# Patient Record
Sex: Male | Born: 1955
Health system: Southern US, Community
[De-identification: ages and names within clinical notes are randomized; demographics above are authoritative.]

## PROBLEM LIST (undated history)

## (undated) DIAGNOSIS — I2699 Other pulmonary embolism without acute cor pulmonale: Secondary | ICD-10-CM

## (undated) DIAGNOSIS — R079 Chest pain, unspecified: Secondary | ICD-10-CM

## (undated) DIAGNOSIS — I82409 Acute embolism and thrombosis of unspecified deep veins of unspecified lower extremity: Secondary | ICD-10-CM

## (undated) DIAGNOSIS — K635 Polyp of colon: Secondary | ICD-10-CM

## (undated) DIAGNOSIS — E785 Hyperlipidemia, unspecified: Secondary | ICD-10-CM

## (undated) DIAGNOSIS — Z8619 Personal history of other infectious and parasitic diseases: Secondary | ICD-10-CM

## (undated) DIAGNOSIS — B559 Leishmaniasis, unspecified: Secondary | ICD-10-CM

## (undated) HISTORY — DX: Acute embolism and thrombosis of unspecified deep veins of unspecified lower extremity: I82.409

## (undated) HISTORY — PX: COLONOSCOPY: SHX174

## (undated) HISTORY — DX: Polyp of colon: K63.5

## (undated) HISTORY — DX: Personal history of other infectious and parasitic diseases: Z86.19

## (undated) HISTORY — DX: Leishmaniasis, unspecified: B55.9

## (undated) HISTORY — DX: Hyperlipidemia, unspecified: E78.5

## (undated) HISTORY — DX: Other pulmonary embolism without acute cor pulmonale: I26.99

## (undated) HISTORY — DX: Chest pain, unspecified: R07.9

---

## 2003-01-31 HISTORY — PX: TENDON REPAIR: SHX5111

## 2003-12-08 ENCOUNTER — Ambulatory Visit (HOSPITAL_BASED_OUTPATIENT_CLINIC_OR_DEPARTMENT_OTHER): Admission: RE | Admit: 2003-12-08 | Discharge: 2003-12-08 | Payer: Self-pay | Admitting: Orthopedic Surgery

## 2003-12-08 ENCOUNTER — Ambulatory Visit (HOSPITAL_COMMUNITY): Admission: RE | Admit: 2003-12-08 | Discharge: 2003-12-08 | Payer: Self-pay | Admitting: Orthopedic Surgery

## 2005-01-30 HISTORY — PX: ROTATOR CUFF REPAIR: SHX139

## 2007-01-31 HISTORY — PX: CATARACT EXTRACTION, BILATERAL: SHX1313

## 2010-11-28 ENCOUNTER — Encounter: Payer: Self-pay | Admitting: Internal Medicine

## 2010-11-28 ENCOUNTER — Ambulatory Visit (INDEPENDENT_AMBULATORY_CARE_PROVIDER_SITE_OTHER): Payer: Managed Care, Other (non HMO) | Admitting: Internal Medicine

## 2010-11-28 VITALS — BP 90/60 | HR 68 | Temp 97.8°F | Resp 18 | Ht 71.5 in | Wt 221.0 lb

## 2010-11-28 DIAGNOSIS — B551 Cutaneous leishmaniasis: Secondary | ICD-10-CM

## 2010-11-28 DIAGNOSIS — Z7901 Long term (current) use of anticoagulants: Secondary | ICD-10-CM

## 2010-11-28 DIAGNOSIS — B559 Leishmaniasis, unspecified: Secondary | ICD-10-CM

## 2010-11-28 DIAGNOSIS — I2699 Other pulmonary embolism without acute cor pulmonale: Secondary | ICD-10-CM | POA: Insufficient documentation

## 2010-11-28 LAB — PROTIME-INR
INR: 1.38 (ref <1.50)
Prothrombin Time: 17.5 seconds — ABNORMAL HIGH (ref 11.6–15.2)

## 2010-11-28 MED ORDER — ENOXAPARIN SODIUM 150 MG/ML ~~LOC~~ SOLN: 1.5000 mg/kg | Freq: Every day | SUBCUTANEOUS | Status: DC

## 2010-12-01 ENCOUNTER — Other Ambulatory Visit: Payer: Self-pay | Admitting: *Deleted

## 2010-12-01 ENCOUNTER — Encounter: Payer: Self-pay | Admitting: Internal Medicine

## 2010-12-01 ENCOUNTER — Telehealth: Payer: Self-pay | Admitting: *Deleted

## 2010-12-01 ENCOUNTER — Ambulatory Visit: Payer: Self-pay | Admitting: Internal Medicine

## 2010-12-01 ENCOUNTER — Ambulatory Visit (INDEPENDENT_AMBULATORY_CARE_PROVIDER_SITE_OTHER): Payer: Managed Care, Other (non HMO) | Admitting: Internal Medicine

## 2010-12-01 VITALS — BP 116/73 | HR 64 | Temp 97.7°F | Ht 72.0 in | Wt 223.0 lb

## 2010-12-01 DIAGNOSIS — Z7901 Long term (current) use of anticoagulants: Secondary | ICD-10-CM

## 2010-12-01 DIAGNOSIS — B551 Cutaneous leishmaniasis: Secondary | ICD-10-CM

## 2010-12-01 DIAGNOSIS — I2699 Other pulmonary embolism without acute cor pulmonale: Secondary | ICD-10-CM

## 2010-12-01 LAB — PROTIME-INR: Prothrombin Time: 21.8 seconds — ABNORMAL HIGH (ref 11.6–15.2)

## 2010-12-01 NOTE — Telephone Encounter (Signed)
Patient returned phone call. He stated that he received voice message left. He was informed Dr Rodena Medin would like for him to remain on Lovenox until PT is at 2 or greater. He stated that he will need 2 additional syringes until Tuesday lab draw. He was informed Rx would be available at [facility] he previously picked up Rx. Patient has verbalized understanding and agrees as instructed. He stated that he will be in Tuesday for blood draw.  Lab order entered for [facility] for 12/06/2010.

## 2010-12-01 NOTE — Progress Notes (Signed)
  Subjective:    Patient ID: Glenn Wheeler, male    DOB: 12-24-1955, 55 y.o.   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 He had 2 different lesions that were isolated to the skin, one being on his right forehand and the other on the right side of his torso that started as what he describes as pimples. He then progressed to skin thickening and thenbegan to ulcerate. He was initially seen by her primary physician who did do a biopsy of it and it was consistent with leishmaniasis. He did not however get speciated and so it is unclear the type. He was sent to followup then at the [facility] in the [place], [place] where he underwent treatment for cutaneous leishmaniasis. He was treated with stibogluconate for 28 days which she tolerated well.  This was all done in [place]. He completed treatment around September 2 and essentially had no significant side effects from the medication. However prior to this appointment on return to the [place], he did end up having a pulmonary embolus. He now then is taking Coumadin and otherwise is doing well. He tells me in regards to his leishmaniasis lesions approximately 7-10 days after beginning therapy they did respond and close up. Today he has no particular complaints and does state that he is happy with the lesions progress as they are now completely resolved.    Review of Systems  Constitutional: Negative for activity change, appetite change, fatigue and unexpected weight change.  Respiratory: Negative for chest tightness.   Gastrointestinal: Negative for nausea, diarrhea and constipation.  Skin: Negative for rash and wound.       Objective:   Physical Exam  Constitutional: He appears well-developed and well-nourished. No distress.  Cardiovascular: Normal rate, regular rhythm and normal  heart sounds.   No murmur heard. Pulmonary/Chest: Effort normal and breath sounds normal. No respiratory distress.  Skin: Skin is warm and dry. No rash noted. No erythema.          Healed areas of previous leishmaniasis lesions.  Small area of erythema.   Psychiatric: He has a normal mood and affect. His behavior is normal.          Assessment & Plan:

## 2010-12-01 NOTE — Assessment & Plan Note (Signed)
At this time, the patient is doing well and his lesions have completely healed. I did discuss with him that having successfully completed treatment there is a small chance that they can recur or that new lesions can occur that had previously not been evident. He will followup in 3 months time for another skin exam 2 sure that they are not worsening again. He was told to call back if he does feel that they are coming back in the meantime though. I also explained that his wife can keep an eye on other parts of his body such as back where he is not able to see as well. If this does recur, second line therapy may need to be employed.

## 2010-12-01 NOTE — Telephone Encounter (Signed)
Call placed to patient at [phone], no answer. A detailed voice message was left informing patient per Dr Rodena Medin instruction. Message was left for patient to return phone call if refill on Lovenox was needed.

## 2010-12-01 NOTE — Telephone Encounter (Signed)
Message copied by Glendell Docker on Thu Dec 01, 2010  1:34 PM ------      Message from: Staci Righter.      Created: Thu Dec 01, 2010 12:36 PM       inr 1.83 (was 1.38). inr finally increasing. Continue same dose. Recheck inr on tues. Continue lovenox (refill if necessary) until inr >2

## 2010-12-02 ENCOUNTER — Encounter: Payer: Self-pay | Admitting: Internal Medicine

## 2010-12-04 ENCOUNTER — Encounter: Payer: Self-pay | Admitting: Internal Medicine

## 2010-12-04 NOTE — Assessment & Plan Note (Addendum)
Obtain stat INR. Resume lovenox (reviewed previous dosing from hospital). Continue lovenox until inr above 2. inr goal 2-3. Coumadin clinic referral. Hematology consult. Schedule close f/u in 2wks or sooner if needed.

## 2010-12-04 NOTE — Assessment & Plan Note (Signed)
Follow up with ID. Avoid pentostam if possible due to phlebotoxic potential.

## 2010-12-04 NOTE — Progress Notes (Signed)
  Subjective:    Patient ID: Glenn Wheeler, male    DOB: 1955-04-19, 55 y.o.   MRN: [id number]  HPI Pt presents to clinic to establish care and for evaluation of multiple medical problems. Recently diagnosed with PE while visiting [place] and placed on lovenox/coumadin without gross active bleeding. Has run out of lovenox and last inr recalled to be 1.23 several days ago. Believes le [place] was neg for dvt. Precipitating factors include prolonged international air travel prior to PE. However father s/p PE but with reportedly neg hypercoagulable w/u. Also recent administered pentostam injxn for cutaneous leshmaniasis. Skin lesions have resolved and is scheduled to see ID in near future for follow up. Colonoscopy utd 2009 with unknown pathology polyps. No other complaints.  Reviewed pmh, psh, medications, allergies, soc hx and fam hx.    Review of Systems  Constitutional: Negative for fever and chills.  Respiratory: Negative for cough and shortness of breath.   Cardiovascular: Negative for chest pain.  Gastrointestinal: Negative for blood in stool.  Genitourinary: Negative for hematuria.  Skin: Negative for rash and wound.  All other systems reviewed and are negative.       Objective:   Physical Exam  Nursing note and vitals reviewed. Constitutional: He appears well-developed and well-nourished. No distress.  HENT:  Head: Normocephalic and atraumatic.  Right Ear: External ear normal.  Left Ear: External ear normal.  Eyes: Conjunctivae are normal.  Neck: Neck supple.  Cardiovascular: Normal rate, regular rhythm and normal heart sounds.  Exam reveals no gallop and no friction rub.   No murmur heard. Pulmonary/Chest: Effort normal and breath sounds normal. No respiratory distress. He has no wheezes. He has no rales.  Neurological: He is alert.  Skin: Skin is warm and dry. He is not diaphoretic.  Psychiatric: He has a normal mood and affect.          Assessment & Plan:

## 2010-12-06 ENCOUNTER — Other Ambulatory Visit: Payer: Self-pay | Admitting: Internal Medicine

## 2010-12-06 ENCOUNTER — Telehealth: Payer: Self-pay | Admitting: *Deleted

## 2010-12-06 DIAGNOSIS — I2699 Other pulmonary embolism without acute cor pulmonale: Secondary | ICD-10-CM

## 2010-12-06 LAB — PROTIME-INR
INR: 2.67 — ABNORMAL HIGH (ref <1.50)
Prothrombin Time: 29.3 seconds — ABNORMAL HIGH (ref 11.6–15.2)

## 2010-12-06 NOTE — Telephone Encounter (Signed)
Received call from [facility] that Stat PT/INR from today is:  PT--29.3   INR--2.67. Pt's current coumadin dose is 10mg  daily and lovenox injections 150mg  daily. Please advise.

## 2010-12-06 NOTE — Telephone Encounter (Signed)
Pt notified and will take Coumadin 7.5mg  (1.5 tabs of 5mg ) daily. Lab order has been entered and forwarded to the lab for Friday.

## 2010-12-06 NOTE — Telephone Encounter (Signed)
Patient called to check what dose of coumaden and should he take his lovenox shot.  Please advise

## 2010-12-06 NOTE — Telephone Encounter (Signed)
Pt can stop lovenox.  He should decrease coumadin to 7.5mg  once a day ( 1.5 tabs of the 5 mg if that is what he has at home).  He should have a stat PT/INR drawn on Friday of this week.  We are working on coumadin clinic referral but it is not yet set up.

## 2010-12-08 ENCOUNTER — Telehealth: Payer: Self-pay | Admitting: Hematology & Oncology

## 2010-12-08 ENCOUNTER — Ambulatory Visit: Payer: Self-pay | Admitting: Internal Medicine

## 2010-12-08 NOTE — Telephone Encounter (Signed)
Pt aware of 12-21-10 appointment

## 2010-12-09 ENCOUNTER — Telehealth: Payer: Self-pay | Admitting: Internal Medicine

## 2010-12-09 ENCOUNTER — Other Ambulatory Visit: Payer: Self-pay | Admitting: Internal Medicine

## 2010-12-09 DIAGNOSIS — Z7901 Long term (current) use of anticoagulants: Secondary | ICD-10-CM

## 2010-12-09 LAB — PROTIME-INR: Prothrombin Time: 27.1 seconds — ABNORMAL HIGH (ref 11.6–15.2)

## 2010-12-09 NOTE — Telephone Encounter (Signed)
Call placed to patient [phone], he was informed per Dr Rodena Medin instruction. He stated that he will return on Friday of next week for blood draw. Lab order entered for [facility] for Friday 12/16/2010.

## 2010-12-09 NOTE — Telephone Encounter (Signed)
Glenn Wheeler with [facility] called with stat PT/INR results on patient  PT 27.1 High INR 2.42 High

## 2010-12-09 NOTE — Telephone Encounter (Signed)
Continue same dose 7.5mg  qd. Should be off lovenox already. Recheck one week. ?status of coumadin clinic referral

## 2010-12-12 ENCOUNTER — Ambulatory Visit (INDEPENDENT_AMBULATORY_CARE_PROVIDER_SITE_OTHER): Payer: Managed Care, Other (non HMO) | Admitting: Internal Medicine

## 2010-12-12 ENCOUNTER — Encounter: Payer: Self-pay | Admitting: Internal Medicine

## 2010-12-12 ENCOUNTER — Ambulatory Visit: Payer: Self-pay | Admitting: Internal Medicine

## 2010-12-12 VITALS — BP 100/70 | HR 78 | Temp 97.7°F | Resp 18 | Wt 226.0 lb

## 2010-12-12 DIAGNOSIS — J3489 Other specified disorders of nose and nasal sinuses: Secondary | ICD-10-CM

## 2010-12-12 DIAGNOSIS — I2699 Other pulmonary embolism without acute cor pulmonale: Secondary | ICD-10-CM

## 2010-12-12 MED ORDER — WARFARIN SODIUM 5 MG PO TABS: ORAL_TABLET | ORAL | Status: DC

## 2010-12-12 NOTE — Progress Notes (Signed)
  Subjective:    Patient ID: Glenn Wheeler, male    DOB: 1955/02/10, 55 y.o.   MRN: [id number]  HPI Pt presents to clinic for followup of multiple medical problems. Tolerating coumadin without gross active bleeding. Denies hemoptysis, blood in stool, epistaxis or hematuria. Has left nostril irritation without injury or drainage. Has H/O and CC appts pending. No other complaints.  Past Medical History  Diagnosis Date  . History of chicken pox     childhood  . Hyperlipidemia   . Colon polyps     2009   Past Surgical History  Procedure Date  . Tendon repair 2005    right perineal tendon-[facility]  . Rotator cuff repair 2007    left [facility]  . Cataract extraction, bilateral 2009    [facility]    reports that he has quit smoking. He has never used smokeless tobacco. He reports that he does not drink alcohol or use illicit drugs. family history includes Hyperlipidemia in his mother; Hypertension in his mother; Prostate cancer in his father; and Stroke in his paternal grandfather. No Known Allergies   Review of Systems see hpi     Objective:   Physical Exam  Nursing note and vitals reviewed. Constitutional: He appears well-developed and well-nourished. No distress.  HENT:  Head: Normocephalic and atraumatic.  Right Ear: External ear normal.  Left Ear: External ear normal.  Nose: Mucosal edema present. No rhinorrhea. No epistaxis.  No foreign bodies.  Eyes: Conjunctivae are normal. No scleral icterus.  Skin: He is not diaphoretic.          Assessment & Plan:

## 2010-12-13 NOTE — Assessment & Plan Note (Signed)
Attempt saline nasal spray. Followup if no improvement or worsening.

## 2010-12-13 NOTE — Assessment & Plan Note (Signed)
Stable. Continue anticoagulation. 

## 2010-12-16 ENCOUNTER — Other Ambulatory Visit: Payer: Self-pay | Admitting: *Deleted

## 2010-12-16 DIAGNOSIS — Z7901 Long term (current) use of anticoagulants: Secondary | ICD-10-CM

## 2010-12-16 LAB — PROTIME-INR
INR: 1.77 — ABNORMAL HIGH (ref <1.50)
Prothrombin Time: 21.2 seconds — ABNORMAL HIGH (ref 11.6–15.2)

## 2010-12-21 ENCOUNTER — Ambulatory Visit (HOSPITAL_BASED_OUTPATIENT_CLINIC_OR_DEPARTMENT_OTHER): Payer: Managed Care, Other (non HMO) | Admitting: Hematology & Oncology

## 2010-12-21 ENCOUNTER — Ambulatory Visit: Payer: Managed Care, Other (non HMO) | Admitting: Family

## 2010-12-21 ENCOUNTER — Ambulatory Visit: Payer: Managed Care, Other (non HMO)

## 2010-12-21 ENCOUNTER — Other Ambulatory Visit (HOSPITAL_BASED_OUTPATIENT_CLINIC_OR_DEPARTMENT_OTHER): Payer: Managed Care, Other (non HMO) | Admitting: Lab

## 2010-12-21 ENCOUNTER — Other Ambulatory Visit: Payer: Self-pay | Admitting: Hematology & Oncology

## 2010-12-21 VITALS — BP 108/69 | HR 81 | Temp 97.6°F | Ht 72.0 in | Wt 224.0 lb

## 2010-12-21 DIAGNOSIS — I2699 Other pulmonary embolism without acute cor pulmonale: Secondary | ICD-10-CM

## 2010-12-21 DIAGNOSIS — D689 Coagulation defect, unspecified: Secondary | ICD-10-CM

## 2010-12-21 LAB — CBC WITH DIFFERENTIAL (CANCER CENTER ONLY)
Eosinophils Absolute: 0.2 10*3/uL (ref 0.0–0.5)
HCT: 39.8 % (ref 38.7–49.9)
LYMPH%: 29.2 % (ref 14.0–48.0)
MCH: 29.9 pg (ref 28.0–33.4)
MCV: 84 fL (ref 82–98)
MONO#: 0.5 10*3/uL (ref 0.1–0.9)
MONO%: 9.7 % (ref 0.0–13.0)
NEUT%: 56.6 % (ref 40.0–80.0)
RBC: 4.75 10*6/uL (ref 4.20–5.70)
WBC: 4.6 10*3/uL (ref 4.0–10.0)

## 2010-12-21 NOTE — Progress Notes (Signed)
CC:   Charlynn Court, MD  DIAGNOSIS:  Pulmonary emboli.  HISTORY OF PRESENT ILLNESS:  Mr. Weigold is a real nice 55 year old white gentleman.  He is actually the older brother of Gerilyn Pilgrim, who [facility] with [place] [facility].  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  He and his wife have been down in [place] for over 20 years.  They recently were down in [place], [place].  He has done a lot of traveling. He has done a lot of flying. He has never had any problems with this.  He had been taking baby aspirin for a while.  He has also been taking Lipitor.  He recently was diagnosed with cutaneous leishmaniasis. He was treated for this with what I assume to be pentavalent antimony.  He is not sure what he received, but he did get this intravenous route.  He recently was up in [place], [place], at [employer] where his daughter is.  Again, he has some issues with some back pain initially.  He then began to have some pain over on his right lower chest wall.  He went to a local emergency room at [facility].  He was found to have bilateral pulmonary emboli.  He was hospitalized.  He said that they did do Dopplers of his legs and arms.  Everything was negative.  He was placed on Lovenox and then placed on Coumadin.  Apparently, he sees Dr. Rodena Medin at [facility].  Dr. Rodena Medin felt that a hematologic evaluation was indicated.  As such, Mr. Kotch was kindly referred to the [facility] for evaluation.  Mr. Ghuman feels well.  He does have some sinus congestion.  He is worried about the possibility of a leishmaniasis recurring.  He does see 1 of the infectious disease specialist at [facility]. He has had no problem with fever.  There has been no bleeding.  He has had no chest pain.  He has had some leg swelling, but this has been more chronic than anything else.  He has not noted any  rashes.  When the leishmaniasis initially developed, he developed subcutaneous ulcerations.  Again, this occurred while he was in [place].  He and his wife are retiring.  They still have a lot of church engagements in which they are speaking at.  He has had no weight loss or weight gain. There has been no change in bowel or bladder habits.  He has had no swallowing difficulties.  He has had no headache, outside of the sinus congestion.  PAST MEDICAL HISTORY:  Is remarkable for: 1. Hyperlipidemia. 2. Cutaneous leishmaniasis.  ALLERGIES: 1. His allergies are none his own.  MEDICATIONS ARE: 1. Coumadin 10.5 mg daily. 2. Lipitor 20 mg p.o. daily.  SOCIAL HISTORY:  Negative for tobacco or alcohol use.  There is no obvious occupational exposures.  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  FAMILY HISTORY:  Is remarkable for his father who had a blood clot.  His father also had a prostate cancer.  REVIEW OF SYSTEMS:  Is as stated in the history of present illness.  No additional findings noted on a 12-system review.  PHYSICAL EXAMINATION:  General: This is a well-developed white gentleman in no obvious distress.  Vital signs show a temperature of 97.6, pulse 81, respiratory 20, and  blood pressure 108/69.  Weight is 224.  HEENT: Head exam shows a normocephalic, atraumatic skull.  He has no ocular or oral lesions.  There is no palpable cervical or supraclavicular lymph nodes.  Thyroid is not palpable.  Lungs are clear to percussion and auscultation bilaterally.  Cardiac exam: Regular rate and rhythm with a normal S1 and S2.  There are no murmurs, rubs, or bruits.  Abdominal exam: Soft with good bowel sounds.  There is no palpable abdominal mass. There is no fluid wave.  There is no palpable hepatosplenomegaly. Back exam: No tenderness of the spine, ribs, or hips.  Extremities shows chronic nonpitting edema in  his lower legs.  He has good range of motion of his joints.  Skin exam shows no rashes, ecchymoses, or petechia. Neurological exam:  No focal neurological deficits.  LAB:  White cell count 4.6, hemoglobin 14.2, hematocrit 39.8, platelet count 122, and MCV is 84.  Peripheral smear shows a normochromic normocytic population of red blood cells.  There are no nucleated red blood cells.  There are no teardrop cells.  I see no Rouleaux formation. White cells appear normal in morphology and maturation.  There is no hypersegmented polys.  There is no immature myeloid or lymphoid forms. There are no atypical lymphocytes.  I see his platelets are adequate in number and size.  The platelets are well-granulated.  IMPRESSION:  Mr. 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  We are still having a lot of fellowship with he and his wife.  I am just very privileged to be able to talk to Mr. Allbaugh about his missionary work.  It is hard to say if there is any underlying factor that triggered this pulmonary embolism.  There is a family history with his father.  We are sending off his hypercoagulable panel.  I am sure that his protein C and protein S levels will be low because of Coumadin.  I do not see any indication for doing any invasive surgery, any radiologic studies.  I cannot find anything on his physical exam or history that would suggest any underlying malignancy.  I do not see anything on his blood smear that would suggest an underlying hematologic problem.  I cannot relate him getting this pulmonary embolism with the cutaneous leishmaniasis.  I am not aware of leishmaniasis or treatment for leishmaniasis causing a thromboembolic disease.  I think it might be interesting to postulate that he may have secondary polycythemia.  He did live at a high altitude for many years.  As such, he could have developed a secondary erythrocytosis that could possibly increase his blood  viscosity.  He has been away from [place] now for a couple of months, but it is possible that he could have secondary polycythemia. Again, when we checked his lab work today, I really cannot see anything that was unusual.  His platelet count was a little on the lower side. However, his blood smear did not look unusual.  I think that the key issue is how long to keep him on anticoagulation. From my point of view, I would probably keep him on anticoagulation for a couple of years.  I think that having an "idiopathic" thromboembolic event like pulmonary emboli does warrant aggressive anticoagulation.  I would also recommend that he have his INR be maintained about 3-3.5 for 3 months.  Some recent studies have shown that aggressive anticoagulation early on in the course of anticoagulation does seem to improve outcome  with decreased recurrences after therapeutic anticoagulation is completed.  I also would recommend putting him on aspirin once he completes his course of anticoagulation.  I do not see any problem with him traveling.  He has compression stockings for his legs.  This is important for him.  He really has no risk factors for thromboembolic disease that should impede traveling. He does not have diabetes.  He does not smoke.  He may be a little bit overweight, but, again, I do not think this is much of an issue.  Again, I did see that his platelet count was a little on the lower side. I am not too worried about this in view of his peripheral smear and his physical exam, both being unremarkable.  We will be in touch with Mr. Wolbert regarding the results of his hypercoagulable studies.  I do not think we have to get Mr. Osorto back to see [place].  He certainly is being followed aggressively by Dr. Adrian Blackwater.  I spent over an hour with Mr. Chancellor and his wife.  Again, it was wonderful having a good fellowship with him and glorifying God, particularly during the upcoming Christmas  holidays.    ______________________________ [name], M.D. PRE/MEDQ  D:  12/21/2010  T:  12/21/2010  Job:  [id number]

## 2010-12-21 NOTE — Progress Notes (Signed)
This office note has been dictated.

## 2010-12-23 ENCOUNTER — Telehealth: Payer: Self-pay | Admitting: Internal Medicine

## 2010-12-23 ENCOUNTER — Other Ambulatory Visit: Payer: Self-pay | Admitting: Internal Medicine

## 2010-12-23 DIAGNOSIS — Z79899 Other long term (current) drug therapy: Secondary | ICD-10-CM

## 2010-12-23 NOTE — Telephone Encounter (Signed)
Had spoken with dr hodgin about getting him in a coumaden clinic.  He is doing the testing with [facility] and they take blood from a view each time.  He wants to go to someone who will just prick the finger.

## 2010-12-23 NOTE — Telephone Encounter (Signed)
And a referral for coumadin clinic was made on 10/29. Suggest checking with Myriam Jacobson

## 2010-12-26 LAB — D-DIMER, QUANTITATIVE
D-Dimer, Quant: 0.87 ug/mL-FEU — ABNORMAL HIGH (ref 0.00–0.48)
D-Dimer, Quant: 0.87 ug/mL-FEU — ABNORMAL HIGH (ref 0.00–0.48)

## 2010-12-30 ENCOUNTER — Other Ambulatory Visit: Payer: Self-pay | Admitting: Internal Medicine

## 2010-12-30 ENCOUNTER — Telehealth: Payer: Self-pay | Admitting: Internal Medicine

## 2010-12-30 DIAGNOSIS — Z79899 Other long term (current) drug therapy: Secondary | ICD-10-CM

## 2010-12-30 LAB — HYPERCOAGULABLE PANEL, COMPREHENSIVE
AntiThromb III Func: 99 % (ref 76–126)
Anticardiolipin IgG: 3 GPL U/mL (ref <23)
Anticardiolipin IgM: 3 MPL U/mL (ref <11)
Beta-2 Glyco I IgG: 0 G Units (ref <20)
Beta-2-Glycoprotein I IgA: 2 A Units (ref <20)
Beta-2-Glycoprotein I IgM: 1 M Units (ref <20)
PTT Lupus Anticoagulant: 43.7 secs — ABNORMAL HIGH (ref 28.0–43.0)
PTTLA 4:1 Mix: 38.9 secs (ref 28.0–43.0)
Protein C, Total: 55 % — ABNORMAL LOW (ref 72–160)
Protein S Activity: 44 % — ABNORMAL LOW (ref 69–129)
Protein S Total: 88 % (ref 60–150)

## 2010-12-30 LAB — PROTIME-INR: INR: 2.05 — ABNORMAL HIGH (ref <1.50)

## 2010-12-30 NOTE — Telephone Encounter (Signed)
Patient is requesting lab results. He states he would like a call back before 5pm

## 2011-01-02 ENCOUNTER — Encounter: Payer: Self-pay | Admitting: Internal Medicine

## 2011-01-02 ENCOUNTER — Telehealth: Payer: Self-pay | Admitting: *Deleted

## 2011-01-02 NOTE — Telephone Encounter (Signed)
Phone call addressed on Friday 12/30/2010. Patient presented to office, spoke with provider regarding test results.

## 2011-01-02 NOTE — Telephone Encounter (Signed)
Need order/referral for Coumedin Clinic

## 2011-01-02 NOTE — Telephone Encounter (Signed)
[facility] Triage Call Report Triage Record Num: [id number] Operator: Lodema Pilot Patient Name: Glenn Wheeler Call Date & Time: 12/30/2010 6:10:11PM Patient Phone: [phone] PCP: Marguarite Arbour Patient Gender: Male PCP Fax : [phone] Patient DOB: 1955-12-24 Practice Name: [facility] Reason for Call: Caller: Delaney Meigs; PCP: Marguarite Arbour; CB#: [phone]; Call Reason: Delaney Meigs is calling from [facility] regarding a PT/INR ordered on Alvira Philips by [facility], Sharlot Gowda. PT: 23.8, INR: 2.05. Collected at 0820 on 12/30/10. Ordered by Dr.Hodgin, Whit. Pts # [id number]. Guideline Used: Office Note; Disp: None; Appt Scheduled?: No. Spoke to Alvira Philips at [phone]. Pt reports no sxs of bleeding or distress. Pt has spoken with Dr. Rodena Medin 12/30/10 and received Coumadin dosage: 10 mg PO daily except for Thursday 7.5 mg PO. Dr. Rodena Medin referred to PT Clinic. Protocol(s) Used: Office Note Recommended Outcome per Protocol: Information Noted and Sent to Office Reason for Outcome: Caller information to office Care Advice: ~

## 2011-01-02 NOTE — Telephone Encounter (Signed)
Pt aware of 1-27 appointment °

## 2011-01-03 ENCOUNTER — Telehealth: Payer: Self-pay | Admitting: *Deleted

## 2011-01-03 MED ORDER — FLUTICASONE PROPIONATE 50 MCG/ACT NA SUSP: 1.0000 | Freq: Every day | NASAL | Status: DC

## 2011-01-03 MED ORDER — ATORVASTATIN CALCIUM 20 MG PO TABS: 20.0000 mg | ORAL_TABLET | Freq: Every day | ORAL | Status: DC

## 2011-01-03 NOTE — Telephone Encounter (Signed)
Has 12/7 appt at hematology. ? For coumadin?

## 2011-01-03 NOTE — Telephone Encounter (Signed)
Flonase and Lipitor sent to [facility].

## 2011-01-06 ENCOUNTER — Ambulatory Visit (HOSPITAL_BASED_OUTPATIENT_CLINIC_OR_DEPARTMENT_OTHER): Payer: Managed Care, Other (non HMO) | Admitting: Lab

## 2011-01-06 ENCOUNTER — Other Ambulatory Visit: Payer: Self-pay | Admitting: Hematology & Oncology

## 2011-01-06 DIAGNOSIS — I2699 Other pulmonary embolism without acute cor pulmonale: Secondary | ICD-10-CM

## 2011-01-06 LAB — PROTIME-INR (CHCC SATELLITE): Protime: 31.2 Seconds — ABNORMAL HIGH (ref 10.6–13.4)

## 2011-01-06 NOTE — Progress Notes (Signed)
When Mr Glenn Wheeler was made aware of his INR 2.6, he was concerned that Dr. Myna Hidalgo was not increasing Coumadin dosage.  Dr. Myna Hidalgo made of aware of his concerns, instructions given to patient to take coumadin 10mg  daily. Teola Bradley, Stefana Lodico [employer]

## 2011-01-13 ENCOUNTER — Ambulatory Visit (HOSPITAL_BASED_OUTPATIENT_CLINIC_OR_DEPARTMENT_OTHER): Payer: Managed Care, Other (non HMO) | Admitting: Lab

## 2011-01-13 DIAGNOSIS — D689 Coagulation defect, unspecified: Secondary | ICD-10-CM

## 2011-01-13 DIAGNOSIS — I2699 Other pulmonary embolism without acute cor pulmonale: Secondary | ICD-10-CM

## 2011-01-13 LAB — PROTIME-INR (CHCC SATELLITE)
INR: 3.3 (ref 2.0–3.5)
Protime: 39.6 Seconds — ABNORMAL HIGH (ref 10.6–13.4)

## 2011-01-20 ENCOUNTER — Other Ambulatory Visit (HOSPITAL_BASED_OUTPATIENT_CLINIC_OR_DEPARTMENT_OTHER): Payer: Managed Care, Other (non HMO) | Admitting: Lab

## 2011-01-20 DIAGNOSIS — I2699 Other pulmonary embolism without acute cor pulmonale: Secondary | ICD-10-CM

## 2011-01-20 LAB — PROTIME-INR (CHCC SATELLITE)
INR: 3.3 (ref 2.0–3.5)
Protime: 39.6 s — ABNORMAL HIGH (ref 10.6–13.4)

## 2011-01-27 ENCOUNTER — Ambulatory Visit (HOSPITAL_BASED_OUTPATIENT_CLINIC_OR_DEPARTMENT_OTHER): Payer: Managed Care, Other (non HMO) | Admitting: Lab

## 2011-01-27 DIAGNOSIS — I82409 Acute embolism and thrombosis of unspecified deep veins of unspecified lower extremity: Secondary | ICD-10-CM

## 2011-01-27 LAB — PROTIME-INR (CHCC SATELLITE)

## 2011-02-03 ENCOUNTER — Encounter: Payer: Self-pay | Admitting: *Deleted

## 2011-02-03 ENCOUNTER — Ambulatory Visit (HOSPITAL_BASED_OUTPATIENT_CLINIC_OR_DEPARTMENT_OTHER): Payer: Managed Care, Other (non HMO) | Admitting: Lab

## 2011-02-03 DIAGNOSIS — I2699 Other pulmonary embolism without acute cor pulmonale: Secondary | ICD-10-CM

## 2011-02-03 LAB — PROTIME-INR (CHCC SATELLITE): Protime: 39.6 Seconds — ABNORMAL HIGH (ref 10.6–13.4)

## 2011-02-03 NOTE — Progress Notes (Signed)
Per Dr. Myna Hidalgo, pt called and told that his INR was good at 3.3 and stay on the same dose of Coumadin which is 10mg /day.

## 2011-02-10 ENCOUNTER — Other Ambulatory Visit (HOSPITAL_BASED_OUTPATIENT_CLINIC_OR_DEPARTMENT_OTHER): Payer: Managed Care, Other (non HMO) | Admitting: Lab

## 2011-02-10 ENCOUNTER — Other Ambulatory Visit: Payer: Self-pay | Admitting: Hematology & Oncology

## 2011-02-10 ENCOUNTER — Telehealth: Payer: Self-pay | Admitting: *Deleted

## 2011-02-10 DIAGNOSIS — I2699 Other pulmonary embolism without acute cor pulmonale: Secondary | ICD-10-CM

## 2011-02-10 LAB — PROTIME-INR (CHCC SATELLITE): Protime: 38.4 Seconds — ABNORMAL HIGH (ref 10.6–13.4)

## 2011-02-15 ENCOUNTER — Encounter: Payer: Self-pay | Admitting: Internal Medicine

## 2011-02-15 ENCOUNTER — Ambulatory Visit (INDEPENDENT_AMBULATORY_CARE_PROVIDER_SITE_OTHER): Payer: Managed Care, Other (non HMO) | Admitting: Internal Medicine

## 2011-02-15 VITALS — BP 100/72 | HR 79 | Temp 97.9°F | Resp 18 | Wt 233.0 lb

## 2011-02-15 DIAGNOSIS — E785 Hyperlipidemia, unspecified: Secondary | ICD-10-CM | POA: Insufficient documentation

## 2011-02-15 DIAGNOSIS — I2699 Other pulmonary embolism without acute cor pulmonale: Secondary | ICD-10-CM

## 2011-02-15 DIAGNOSIS — E782 Mixed hyperlipidemia: Secondary | ICD-10-CM | POA: Insufficient documentation

## 2011-02-15 DIAGNOSIS — R0981 Nasal congestion: Secondary | ICD-10-CM | POA: Insufficient documentation

## 2011-02-15 DIAGNOSIS — E7800 Pure hypercholesterolemia, unspecified: Secondary | ICD-10-CM | POA: Insufficient documentation

## 2011-02-15 DIAGNOSIS — E669 Obesity, unspecified: Secondary | ICD-10-CM

## 2011-02-15 DIAGNOSIS — J3489 Other specified disorders of nose and nasal sinuses: Secondary | ICD-10-CM

## 2011-02-15 NOTE — Progress Notes (Signed)
  Subjective:    Patient ID: Glenn Wheeler, male    DOB: 04/25/1955, 56 y.o.   MRN: [id number]  HPI Pt presents to clinic for followup of multiple medical problems. H/o pulmonary embolism with anticoagulation currently managed by H/O. States was recommended for 2 year duration. No gross active bleeding. Has chronic nasal congestion specifically left sided. No improvement with flonase. Tolerates statin tx without myalgias or abn lft. Interested in weight loss attempt. Total time of visit ~28 minutes of which greater than 50% of time spent in counseling.   Past Medical History  Diagnosis Date  . History of chicken pox     childhood  . Hyperlipidemia   . Colon polyps     2009   Past Surgical History  Procedure Date  . Tendon repair 2005    right perineal tendon-[facility]  . Rotator cuff repair 2007    left [facility]  . Cataract extraction, bilateral 2009    [facility]    reports that he has quit smoking. He has never used smokeless tobacco. He reports that he does not drink alcohol or use illicit drugs. family history includes Hyperlipidemia in his mother; Hypertension in his mother; Prostate cancer in his father; and Stroke in his paternal grandfather. No Known Allergies    Review of Systems see hpi     Objective:   Physical Exam  Nursing note and vitals reviewed. Constitutional: He appears well-developed and well-nourished. No distress.  HENT:  Head: Normocephalic and atraumatic.  Right Ear: External ear normal.  Left Ear: External ear normal.  Nose: Mucosal edema present. No epistaxis.  No foreign bodies.       Bilateral mucosal erythema and edema L>R  Eyes: Conjunctivae are normal. No scleral icterus.  Skin: He is not diaphoretic.          Assessment & Plan:

## 2011-02-15 NOTE — Assessment & Plan Note (Signed)
Stable. No gross active bleeding with coumadin-monitored by H/O. Discussed potential follow up imaging in the future

## 2011-02-15 NOTE — Assessment & Plan Note (Signed)
BMI 30. Discussed and encouraged caloric reduction, reduction of sugars/carbs and regular aerobic exercise.

## 2011-02-15 NOTE — Patient Instructions (Signed)
Please schedule fasting labs for this Friday Lipid/lft 272.4

## 2011-02-15 NOTE — Assessment & Plan Note (Signed)
Primarily focal left sided sx's. H/o leishmaniasis. Request ENT consult.

## 2011-02-15 NOTE — Assessment & Plan Note (Signed)
Obtain lipid/lft. 

## 2011-02-17 ENCOUNTER — Other Ambulatory Visit: Payer: Self-pay | Admitting: Hematology & Oncology

## 2011-02-17 ENCOUNTER — Other Ambulatory Visit: Payer: Self-pay | Admitting: Internal Medicine

## 2011-02-17 ENCOUNTER — Ambulatory Visit (HOSPITAL_BASED_OUTPATIENT_CLINIC_OR_DEPARTMENT_OTHER): Payer: Managed Care, Other (non HMO) | Admitting: Lab

## 2011-02-17 DIAGNOSIS — I2699 Other pulmonary embolism without acute cor pulmonale: Secondary | ICD-10-CM

## 2011-02-17 DIAGNOSIS — E785 Hyperlipidemia, unspecified: Secondary | ICD-10-CM

## 2011-02-17 DIAGNOSIS — I82409 Acute embolism and thrombosis of unspecified deep veins of unspecified lower extremity: Secondary | ICD-10-CM

## 2011-02-17 LAB — PROTIME-INR (CHCC SATELLITE)
INR: 3.8 — ABNORMAL HIGH (ref 2.0–3.5)
Protime: 45.6 Seconds — ABNORMAL HIGH (ref 10.6–13.4)

## 2011-02-17 LAB — LIPID PANEL
Cholesterol: 218 mg/dL — ABNORMAL HIGH (ref 0–200)
VLDL: 66 mg/dL — ABNORMAL HIGH (ref 0–40)

## 2011-02-17 LAB — HEPATIC FUNCTION PANEL
ALT: 29 U/L (ref 0–53)
Bilirubin, Direct: 0.1 mg/dL (ref 0.0–0.3)
Indirect Bilirubin: 0.4 mg/dL (ref 0.0–0.9)

## 2011-02-17 LAB — POCT INR: INR: 3.8

## 2011-02-20 ENCOUNTER — Telehealth: Payer: Self-pay | Admitting: Hematology & Oncology

## 2011-02-20 NOTE — Telephone Encounter (Signed)
Pt aware of 02-21-11 lab and will get schedule when he gets in

## 2011-02-21 ENCOUNTER — Ambulatory Visit: Payer: Managed Care, Other (non HMO) | Admitting: Lab

## 2011-02-21 DIAGNOSIS — I2699 Other pulmonary embolism without acute cor pulmonale: Secondary | ICD-10-CM

## 2011-02-21 NOTE — Progress Notes (Unsigned)
Spoke to pt regarding INR results from today. He was concerned that he was not within his normal goal of 3-3.5 but explained to him (after reviewing with Dr Myna Hidalgo & the pharmacist) that Bactrim can make INRs sporadic therefore he should stay on 5 mg as his levels will most likely increase. Provided education on s/s of bleeding and when to call the MD and/or go to the ER (i.e. Nose bleeds, blood in urine or stools, bruising, petechiae, bad headache, a cut that won't stop bleeding, etc). He verbalized understanding and will call if he feels like he needs to come in sooner to have his INR checked.

## 2011-02-28 ENCOUNTER — Ambulatory Visit (HOSPITAL_BASED_OUTPATIENT_CLINIC_OR_DEPARTMENT_OTHER): Payer: Managed Care, Other (non HMO) | Admitting: Lab

## 2011-02-28 DIAGNOSIS — I2699 Other pulmonary embolism without acute cor pulmonale: Secondary | ICD-10-CM

## 2011-02-28 LAB — PROTIME-INR (CHCC SATELLITE): INR: 1.5 — ABNORMAL LOW (ref 2.0–3.5)

## 2011-02-28 LAB — POCT INR: INR: 1.5

## 2011-03-07 ENCOUNTER — Other Ambulatory Visit (HOSPITAL_BASED_OUTPATIENT_CLINIC_OR_DEPARTMENT_OTHER): Payer: Managed Care, Other (non HMO) | Admitting: Lab

## 2011-03-07 DIAGNOSIS — I2699 Other pulmonary embolism without acute cor pulmonale: Secondary | ICD-10-CM

## 2011-03-07 LAB — PROTIME-INR (CHCC SATELLITE)
INR: 1.8 — ABNORMAL LOW (ref 2.0–3.5)
Protime: 21.6 Seconds — ABNORMAL HIGH (ref 10.6–13.4)

## 2011-03-09 NOTE — Telephone Encounter (Signed)
Completed- Per Dr. Myna Hidalgo, pt called and told that his INR is good at 3.2. Pt states he takes 10mg /day. Instruced to stay on that dose.

## 2011-03-14 ENCOUNTER — Other Ambulatory Visit: Payer: Managed Care, Other (non HMO) | Admitting: Lab

## 2011-03-17 ENCOUNTER — Other Ambulatory Visit: Payer: Managed Care, Other (non HMO) | Admitting: Lab

## 2011-03-17 DIAGNOSIS — I2699 Other pulmonary embolism without acute cor pulmonale: Secondary | ICD-10-CM

## 2011-03-17 LAB — PROTIME-INR (CHCC SATELLITE)

## 2011-03-21 ENCOUNTER — Other Ambulatory Visit: Payer: Managed Care, Other (non HMO) | Admitting: Lab

## 2011-03-24 ENCOUNTER — Other Ambulatory Visit (HOSPITAL_BASED_OUTPATIENT_CLINIC_OR_DEPARTMENT_OTHER): Payer: Managed Care, Other (non HMO) | Admitting: Lab

## 2011-03-24 DIAGNOSIS — I2699 Other pulmonary embolism without acute cor pulmonale: Secondary | ICD-10-CM

## 2011-03-24 LAB — PROTIME-INR (CHCC SATELLITE)
INR: 1.8 — ABNORMAL LOW (ref 2.0–3.5)
Protime: 21.6 s — ABNORMAL HIGH (ref 10.6–13.4)

## 2011-03-28 ENCOUNTER — Other Ambulatory Visit (HOSPITAL_BASED_OUTPATIENT_CLINIC_OR_DEPARTMENT_OTHER): Payer: Managed Care, Other (non HMO) | Admitting: Lab

## 2011-03-28 DIAGNOSIS — I2699 Other pulmonary embolism without acute cor pulmonale: Secondary | ICD-10-CM

## 2011-03-28 LAB — D-DIMER, QUANTITATIVE: D-Dimer, Quant: 0.39 ug/mL-FEU (ref 0.00–0.48)

## 2011-03-28 LAB — PROTIME-INR (CHCC SATELLITE): Protime: 18 Seconds — ABNORMAL HIGH (ref 10.6–13.4)

## 2011-03-28 LAB — CBC WITH DIFFERENTIAL (CANCER CENTER ONLY)
BASO%: 1.2 % (ref 0.0–2.0)
Eosinophils Absolute: 0.1 10*3/uL (ref 0.0–0.5)
HCT: 43.3 % (ref 38.7–49.9)
HGB: 15.1 g/dL (ref 13.0–17.1)
LYMPH#: 1.5 10*3/uL (ref 0.9–3.3)
MONO#: 0.4 10*3/uL (ref 0.1–0.9)
NEUT%: 51.8 % (ref 40.0–80.0)
RBC: 5.11 10*6/uL (ref 4.20–5.70)
RDW: 14.2 % (ref 11.1–15.7)
WBC: 4.3 10*3/uL (ref 4.0–10.0)

## 2011-03-30 ENCOUNTER — Encounter: Payer: Self-pay | Admitting: Internal Medicine

## 2011-03-30 ENCOUNTER — Ambulatory Visit (INDEPENDENT_AMBULATORY_CARE_PROVIDER_SITE_OTHER): Payer: Managed Care, Other (non HMO) | Admitting: Internal Medicine

## 2011-03-30 DIAGNOSIS — B551 Cutaneous leishmaniasis: Secondary | ICD-10-CM

## 2011-03-30 NOTE — Assessment & Plan Note (Signed)
He continues to do well with no new lesions.  He is at a small risk for recurrence or mucocutaneous disease but no evidence to date.  He is going to follow up ENT to be sure no necrosis.  If antibiotics still suggested, he could take doxycycline for Staph instead of Bactrim while on coumadin.    Otherwise he can follow up PRN.

## 2011-03-30 NOTE — Progress Notes (Signed)
  Subjective:    Patient ID: Glenn Wheeler, male    DOB: 28-Nov-1955, 56 y.o.   MRN: [id number]  HPI here for follow up of cutaneous leishmaniasis.  He developed this in [place] and completed tretament at [facility] in [place] in August.  His lesions resolved and he is doing well.  He continues to not have any new areas.  He wwas seen by ENT with some chronic congestion and exam did not reveal any necrosis concerning for mucocutaneous leishmaniasis.  He did take Bactrim for presumed Staph but it interacted too much with the coumadin.      Review of Systems  Constitutional: Negative for fever, chills, appetite change and fatigue.  HENT: Positive for sinus pressure. Negative for sore throat, trouble swallowing, dental problem and voice change.   Musculoskeletal: Negative for myalgias, joint swelling and arthralgias.  Skin: Negative for pallor and rash.       Objective:   Physical Exam  HENT:  Mouth/Throat: Oropharynx is clear and moist. No oropharyngeal exudate.       No obvious nasal or palate abnormalities  Cardiovascular: Normal rate, regular rhythm and normal heart sounds.   No murmur heard. Skin: Skin is warm and dry. No rash noted. No erythema.          Assessment & Plan:

## 2011-04-04 ENCOUNTER — Other Ambulatory Visit (HOSPITAL_BASED_OUTPATIENT_CLINIC_OR_DEPARTMENT_OTHER): Payer: Managed Care, Other (non HMO) | Admitting: Lab

## 2011-04-04 ENCOUNTER — Ambulatory Visit (HOSPITAL_BASED_OUTPATIENT_CLINIC_OR_DEPARTMENT_OTHER): Payer: Managed Care, Other (non HMO) | Admitting: Hematology & Oncology

## 2011-04-04 VITALS — BP 111/77 | HR 68 | Temp 97.0°F | Ht 72.0 in | Wt 226.0 lb

## 2011-04-04 DIAGNOSIS — I2699 Other pulmonary embolism without acute cor pulmonale: Secondary | ICD-10-CM

## 2011-04-04 LAB — CBC WITH DIFFERENTIAL (CANCER CENTER ONLY)
BASO%: 0.9 % (ref 0.0–2.0)
Eosinophils Absolute: 0.1 10*3/uL (ref 0.0–0.5)
LYMPH%: 29.3 % (ref 14.0–48.0)
MCV: 84 fL (ref 82–98)
MONO#: 0.4 10*3/uL (ref 0.1–0.9)
MONO%: 9.8 % (ref 0.0–13.0)
NEUT#: 2.6 10*3/uL (ref 1.5–6.5)
Platelets: 131 10*3/uL — ABNORMAL LOW (ref 145–400)
RBC: 5.09 10*6/uL (ref 4.20–5.70)
RDW: 14.3 % (ref 11.1–15.7)
WBC: 4.5 10*3/uL (ref 4.0–10.0)

## 2011-04-04 LAB — PROTIME-INR (CHCC SATELLITE)
INR: 2.8 (ref 2.0–3.5)
Protime: 33.6 Seconds — ABNORMAL HIGH (ref 10.6–13.4)

## 2011-04-04 LAB — D-DIMER, QUANTITATIVE: D-Dimer, Quant: 0.31 ug/mL-FEU (ref 0.00–0.48)

## 2011-04-04 MED ORDER — RIVAROXABAN 20 MG PO TABS: 20.0000 mg | ORAL_TABLET | Freq: Every day | ORAL | Status: DC

## 2011-04-04 NOTE — Progress Notes (Signed)
CC:   Marguarite Arbour, MD  DIAGNOSIS:  Pulmonary embolism, idiopathic.  CURRENT THERAPY: 1. Coumadin to maintain INR between 2-3. 2. The patient to start Xarelto 20 mg a day.  INTERIM HISTORY:  Glenn Wheeler comes in for followup.  We last saw him back in November.  He has been on Coumadin since then.  He developed his pulmonary embolism I think back in October.  He is doing well.  He feels okay.  He does have some vascular insufficiency in his legs.  He has compression stockings which he does use on occasion.  He has a history of cutaneous leishmaniasis.  Thankfully, this has not been a problem for him.  We did do a hypercoagulable workup on him.  All of his hypercoagulable tests came back normal.  His protein C and protein S did come back low, which is no surprise as he is on Coumadin.  He has had no bleeding.  There has been no change in bowel or bladder habits.  He has had no fevers, sweats or chills.  PHYSICAL EXAMINATION:  General:  This is a well-developed, well- nourished white gentleman in no obvious distress.  Vital signs:  97, pulse 68, respiratory rate 18, blood pressure 111/77.  Weight is 226. Head and neck:  Exam showed a normocephalic, atraumatic skull.  There are no ocular or oral lesions.  There are no palpable cervical or supraclavicular lymph nodes.  Lungs:  Are clear bilaterally.  Cardiac: Regular rate and rhythm with a normal S1 and S2.  There are no murmurs, rubs or bruits.  Abdomen:  Soft with good bowel sounds.  There is no palpable abdominal mass.  There is no fluid wave.  There is no palpable hepatosplenomegaly.  Back:  Exam shows no tenderness over the spine, ribs or hips.  Extremities:  Does show some chronic nonpitting edema of the right leg.  He has minimal edema of the left leg.  He has good range motion of the joints.  He has good pulses in his distal extremities. Skin:  Exam shows no rashes, ecchymoses or petechiae.  Neurological:  No focal neurological  deficits.  LABORATORY STUDIES:  White cell count is 4.5, hemoglobin 15.1, hematocrit 42.9, platelet count 131.  INR is 2.8.  IMPRESSION:  Glenn Wheeler is a 56 year old gentleman with idiopathic pulmonary embolism.  He developed this in October of 2012.  Of note, when we last checked his D-dimer last week it was 0.39.  I really believe that we can switch Glenn Wheeler over to Xarelto.  I just believe that this would be a lot easier for him.  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  Thankfully, he will be in town for the next 5 weeks.  I just do not see a "downside" to Xarelto with Glenn Wheeler.  I would keep him on anticoagulation for 2 years given that this was an idiopathic event.  I know that there is no defined rule for this situation but I just believe that we would benefit him by longer term anticoagulation.  I would then put him on aspirin afterwards.  I will check his protein C and protein S levels when we see him back. By then, the Coumadin effect should be out of his system.  I do want to see him back in about 6 weeks' time.    ______________________________ [name], M.D. PRE/MEDQ  D:  04/04/2011  T:  04/04/2011  Job:  [id number]

## 2011-04-04 NOTE — Progress Notes (Signed)
This office note has been dictated.

## 2011-04-04 NOTE — Progress Notes (Signed)
Addended by: Arlan Organ R on: 04/04/2011 11:17 AM   Modules accepted: Orders, Medications

## 2011-04-07 ENCOUNTER — Ambulatory Visit (INDEPENDENT_AMBULATORY_CARE_PROVIDER_SITE_OTHER): Payer: Managed Care, Other (non HMO) | Admitting: Internal Medicine

## 2011-04-07 ENCOUNTER — Encounter: Payer: Self-pay | Admitting: Internal Medicine

## 2011-04-07 VITALS — BP 104/64 | HR 71 | Temp 98.1°F | Resp 18

## 2011-04-07 DIAGNOSIS — M7710 Lateral epicondylitis, unspecified elbow: Secondary | ICD-10-CM

## 2011-04-07 DIAGNOSIS — M7712 Lateral epicondylitis, left elbow: Secondary | ICD-10-CM

## 2011-04-08 DIAGNOSIS — M7712 Lateral epicondylitis, left elbow: Secondary | ICD-10-CM | POA: Insufficient documentation

## 2011-04-08 NOTE — Assessment & Plan Note (Signed)
Mild. Avoid po nsaids. Use topical heat/cold. Discussed voltaren gel however pt defers. Followup if no improvement or worsening.

## 2011-04-08 NOTE — Progress Notes (Signed)
  Subjective:    Patient ID: Glenn Wheeler, male    DOB: 12/17/1955, 56 y.o.   MRN: [id number]  HPI Pt presents to clinic for evaluation of elbow pain. Notes two day h/o left lateral elbow pain without injury. Pain worse with pronation. Notes no joint swelling or redness. Taking no medication for the problem. Maintained now on xarelto for PE without gross active bleeding. No other complaints.  Past Medical History  Diagnosis Date  . History of chicken pox     childhood  . Hyperlipidemia   . Colon polyps     2009   Past Surgical History  Procedure Date  . Tendon repair 2005    right perineal tendon-[facility]  . Rotator cuff repair 2007    [facility]  . Cataract extraction, bilateral 2009    [facility]    reports that he has quit smoking. He has never used smokeless tobacco. He reports that he does not drink alcohol or use illicit drugs. family history includes Hyperlipidemia in his mother; Hypertension in his mother; Prostate cancer in his father; and Stroke in his paternal grandfather. No Known Allergies   Review of Systems see hpi     Objective:   Physical Exam  Nursing note and vitals reviewed. Constitutional: He appears well-developed and well-nourished.  HENT:  Head: Normocephalic and atraumatic.  Musculoskeletal:       Left elbow:no erythema, warmth or effusion. FROM. Mild tenderness lateral epicondyle  Neurological: He is alert.  Psychiatric: He has a normal mood and affect.          Assessment & Plan:

## 2011-04-10 ENCOUNTER — Other Ambulatory Visit (HOSPITAL_BASED_OUTPATIENT_CLINIC_OR_DEPARTMENT_OTHER): Payer: Managed Care, Other (non HMO)

## 2011-04-11 ENCOUNTER — Other Ambulatory Visit: Payer: Managed Care, Other (non HMO) | Admitting: Lab

## 2011-04-13 ENCOUNTER — Ambulatory Visit
Admission: RE | Admit: 2011-04-13 | Discharge: 2011-04-13 | Disposition: A | Discharge status: Home / Self Care | Payer: Managed Care, Other (non HMO) | Source: Ambulatory Visit | Attending: Hematology & Oncology | Admitting: Hematology & Oncology

## 2011-04-13 DIAGNOSIS — I2699 Other pulmonary embolism without acute cor pulmonale: Secondary | ICD-10-CM

## 2011-04-13 MED ORDER — IOHEXOL 350 MG/ML SOLN
100.0000 mL | Freq: Once | INTRAVENOUS | Status: AC | PRN
  Administered 2011-04-13: 100 mL via INTRAVENOUS

## 2011-04-18 ENCOUNTER — Other Ambulatory Visit: Payer: Managed Care, Other (non HMO) | Admitting: Lab

## 2011-04-21 ENCOUNTER — Telehealth: Payer: Self-pay | Admitting: *Deleted

## 2011-04-21 NOTE — Telephone Encounter (Signed)
Call placed to patient at [phone], he stated that he has the answers to his questions. He stated that he spoke with Dr Myna Hidalgo office regarding the blood thinner and he will be seeing someone this afternoon to have his toe looked at.

## 2011-04-21 NOTE — Telephone Encounter (Signed)
Patient called and left voice message stating he has an ingrown toenail that is causing swelling and pain. He states that he is taking a blood thinner and he would like to know what Dr Rodena Medin advises on what he could do to get some relief.

## 2011-04-21 NOTE — Telephone Encounter (Signed)
For pain-no nsaids. Tylenol mild pain. If more severe can call in ultram. Need to consider abx if seems infected or podiatry if the nail needs to be partially cut back

## 2011-05-11 ENCOUNTER — Ambulatory Visit (HOSPITAL_BASED_OUTPATIENT_CLINIC_OR_DEPARTMENT_OTHER): Payer: Managed Care, Other (non HMO) | Admitting: Hematology & Oncology

## 2011-05-11 ENCOUNTER — Other Ambulatory Visit: Payer: Managed Care, Other (non HMO) | Admitting: Lab

## 2011-05-11 ENCOUNTER — Other Ambulatory Visit: Payer: Self-pay | Admitting: Hematology & Oncology

## 2011-05-11 ENCOUNTER — Ambulatory Visit: Payer: Managed Care, Other (non HMO) | Admitting: Hematology & Oncology

## 2011-05-11 VITALS — BP 109/71 | HR 68 | Temp 97.6°F | Ht 72.0 in | Wt 225.0 lb

## 2011-05-11 DIAGNOSIS — I2699 Other pulmonary embolism without acute cor pulmonale: Secondary | ICD-10-CM

## 2011-05-11 LAB — CBC WITH DIFFERENTIAL (CANCER CENTER ONLY)
BASO%: 0.7 % (ref 0.0–2.0)
Eosinophils Absolute: 0.1 10*3/uL (ref 0.0–0.5)
MONO#: 0.4 10*3/uL (ref 0.1–0.9)
MONO%: 9.5 % (ref 0.0–13.0)
NEUT#: 2.3 10*3/uL (ref 1.5–6.5)
Platelets: 119 10*3/uL — ABNORMAL LOW (ref 145–400)
RBC: 4.76 10*6/uL (ref 4.20–5.70)
WBC: 4.3 10*3/uL (ref 4.0–10.0)

## 2011-05-11 NOTE — Progress Notes (Signed)
This office note has been dictated.

## 2011-05-12 ENCOUNTER — Other Ambulatory Visit: Payer: Self-pay | Admitting: *Deleted

## 2011-05-12 ENCOUNTER — Encounter: Payer: Self-pay | Admitting: Internal Medicine

## 2011-05-12 LAB — PROTEIN S, TOTAL: Protein S Total: 101 % (ref 60–150)

## 2011-05-12 LAB — PROTEIN C ACTIVITY: Protein C Activity: 165 % — ABNORMAL HIGH (ref 75–133)

## 2011-05-12 LAB — PROTEIN S ACTIVITY: Protein S Activity: 130 % — ABNORMAL HIGH (ref 69–129)

## 2011-05-12 LAB — D-DIMER, QUANTITATIVE: D-Dimer, Quant: 0.26 ug/mL-FEU (ref 0.00–0.48)

## 2011-05-12 MED ORDER — ATORVASTATIN CALCIUM 20 MG PO TABS: 20.0000 mg | ORAL_TABLET | Freq: Every day | ORAL | Status: DC

## 2011-05-12 NOTE — Progress Notes (Signed)
CC:   Marguarite Arbour, MD  DIAGNOSIS:  Pulmonary embolism, idiopathic.  CURRENT THERAPY:  Xarelto 20 mg p.o. daily.  INTERIM HISTORY:  Glenn Wheeler comes in for followup.  He is doing well.  He is now on Xarelto.  He is having no problems with Xarelto.  We went ahead and repeated his CT of the chest and Dopplers of his legs. CT of the chest showed a very small amount of residual thrombus within 2 subsegmental branches.  Overall, clot burden is minimal.  Ultrasound of his legs did not show any evidence of DVT.  We have done hypercoagulable studies on him.  So far all the studies have come back negative.  His D-dimer when we last checked in March was 0.31.  Now that he is off Coumadin, I am checking his protein C and protein S levels.  He is traveling.  I told him to make sure he wears his compression stockings when he travels.  He has had no problems with headache.  He has had no cough.  He has had no change in bowel or bladder habits.  PHYSICAL EXAMINATION:  General:  This is a well-developed, well- nourished white gentleman in no obvious distress.  Vital Signs: Temperature 97.6, pulse 68, respiratory rate 18, blood pressure 109/71, weight is 225.  Head and Neck Exam:  Shows a normocephalic, atraumatic skull.  There are no ocular or oral lesions.  There are no palpable cervical or supraclavicular lymph nodes.  Lungs:  Clear bilaterally. Cardiac Exam:  Regular rate and rhythm with a normal S1 and S2.  There are no murmurs, rubs, or bruits.  Abdominal Exam:  Soft with good bowel sounds.  There is no palpable abdominal mass.  There is no fluid wave. There is no palpable hepatosplenomegaly.  Extremities:  Show no clubbing, cyanosis, or edema.  Neurological Exam:  Shows no focal neurological deficits.  LABORATORY STUDIES:  White cell count is 4.3, hemoglobin 14.3, hematocrit 41.5, platelet count 119.  IMPRESSION:  Glenn Wheeler is a 56 year old gentleman with pulmonary embolism.  Again,  this appears be idiopathic.  We will see what his protein C and protein S levels are.  Even if he does have abnormal protein C and protein S levels, I do not see that we need to change our duration of anticoagulation.  I still believe that 2 years would be a reasonable way to go with him.  We will plan to get him back now in 3 months' time.  I do not see that we need to make any adjustments with his Xarelto.  He does not need any scans or Dopplers unless he is clinically changed.    ______________________________ Glenn Wheeler, M.D. PRE/MEDQ  D:  05/11/2011  T:  05/12/2011  Job:  [id number]

## 2011-05-12 NOTE — Telephone Encounter (Signed)
Patient sent my chart message requesting refill on Lipitor. Rx sent to pharmacy.

## 2011-05-16 ENCOUNTER — Ambulatory Visit (INDEPENDENT_AMBULATORY_CARE_PROVIDER_SITE_OTHER): Payer: Managed Care, Other (non HMO) | Admitting: Internal Medicine

## 2011-05-16 ENCOUNTER — Telehealth: Payer: Self-pay | Admitting: Internal Medicine

## 2011-05-16 ENCOUNTER — Encounter: Payer: Self-pay | Admitting: Internal Medicine

## 2011-05-16 VITALS — BP 100/74 | HR 72 | Temp 98.0°F | Resp 18 | Ht 72.0 in | Wt 227.0 lb

## 2011-05-16 DIAGNOSIS — E785 Hyperlipidemia, unspecified: Secondary | ICD-10-CM

## 2011-05-16 DIAGNOSIS — M7710 Lateral epicondylitis, unspecified elbow: Secondary | ICD-10-CM

## 2011-05-16 DIAGNOSIS — Z79899 Other long term (current) drug therapy: Secondary | ICD-10-CM

## 2011-05-16 DIAGNOSIS — M7712 Lateral epicondylitis, left elbow: Secondary | ICD-10-CM

## 2011-05-16 MED ORDER — DICLOFENAC SODIUM 1 % TD GEL: 1.0000 "application " | Freq: Four times a day (QID) | TRANSDERMAL | Status: DC

## 2011-05-16 NOTE — Patient Instructions (Signed)
Please schedule fasting labs prior to next visit Lipid/lft 272.4 and cbc-v58.69

## 2011-05-16 NOTE — Telephone Encounter (Signed)
Lab order entered for July 2013. 

## 2011-05-17 NOTE — Assessment & Plan Note (Signed)
Avoid nsaids. Attempt voltaren gel to affected area. Consider sports medicine consult if sx's persist

## 2011-05-17 NOTE — Assessment & Plan Note (Signed)
Stable. Continue statin tx. Obtain lipid/lft prior to next visit 

## 2011-05-17 NOTE — Progress Notes (Signed)
  Subjective:    Patient ID: Glenn Wheeler, male    DOB: 07/11/1955, 56 y.o.   MRN: [id number]  HPI Pt presents to clinic for followup of multiple medical problems. Left lateral elbow pain persists. No injury. Taking no medication for the problem. Worsens with position change. Tolerating statin tx without myalgias or abn lft.  Past Medical History  Diagnosis Date  . History of chicken pox     childhood  . Hyperlipidemia   . Colon polyps     2009   Past Surgical History  Procedure Date  . Tendon repair 2005    right perineal tendon-[facility]  . Rotator cuff repair 2007    left [facility] cuff[facility]  . Cataract extraction, bilateral 2009    [facility]    reports that he has quit smoking. He has never used smokeless tobacco. He reports that he does not drink alcohol or use illicit drugs. family history includes Hyperlipidemia in his mother; Hypertension in his mother; Prostate cancer in his father; and Stroke in his paternal grandfather. No Known Allergies    Review of Systems see hpi     Objective:   Physical Exam  Nursing note and vitals reviewed. Constitutional: He appears well-developed and well-nourished. No distress.  HENT:  Head: Normocephalic and atraumatic.  Right Ear: External ear normal.  Left Ear: External ear normal.  Nose: Nose normal.  Eyes: Conjunctivae are normal. No scleral icterus.  Neck: Neck supple.  Musculoskeletal:       Left elbow - FROM. No erythema, warmth or effusion. +tenderness left lateral epicondylar area. No bony abn.  Neurological: He is alert.  Skin: Skin is warm and dry. No rash noted. He is not diaphoretic. No erythema.  Psychiatric: He has a normal mood and affect.          Assessment & Plan:

## 2011-05-19 ENCOUNTER — Telehealth: Payer: Self-pay | Admitting: *Deleted

## 2011-05-19 NOTE — Telephone Encounter (Addendum)
Message copied by Mirian Capuchin on Fri May 19, 2011  4:22 PM ------      Message from: Arlan Organ R      Created: Thu May 18, 2011  6:15 PM       Call - clotting studies are ok!!!  Cindee Lame This message given to pt.  Voiced understanding.

## 2011-05-31 DIAGNOSIS — B559 Leishmaniasis, unspecified: Secondary | ICD-10-CM

## 2011-05-31 HISTORY — DX: Leishmaniasis, unspecified: B55.9

## 2011-06-12 ENCOUNTER — Ambulatory Visit (INDEPENDENT_AMBULATORY_CARE_PROVIDER_SITE_OTHER): Payer: Managed Care, Other (non HMO) | Admitting: Internal Medicine

## 2011-06-12 ENCOUNTER — Encounter: Payer: Self-pay | Admitting: Internal Medicine

## 2011-06-12 VITALS — BP 110/60 | HR 71 | Temp 98.0°F | Resp 18 | Wt 231.0 lb

## 2011-06-12 DIAGNOSIS — M7712 Lateral epicondylitis, left elbow: Secondary | ICD-10-CM

## 2011-06-12 DIAGNOSIS — J069 Acute upper respiratory infection, unspecified: Secondary | ICD-10-CM | POA: Insufficient documentation

## 2011-06-12 DIAGNOSIS — M7710 Lateral epicondylitis, unspecified elbow: Secondary | ICD-10-CM

## 2011-06-12 MED ORDER — AMOXICILLIN 875 MG PO TABS: 875.0000 mg | ORAL_TABLET | Freq: Two times a day (BID) | ORAL | Status: DC

## 2011-06-12 MED ORDER — HYDROCOD POLST-CHLORPHEN POLST 10-8 MG/5ML PO LQCR: 5.0000 mL | Freq: Two times a day (BID) | ORAL | Status: DC | PRN

## 2011-06-12 NOTE — Assessment & Plan Note (Signed)
Begin tussionex prn. Given printed abx to hold. Begin abx if no improvement in sx's after total duration of 8-10 days. Followup if no improvement or worsening.

## 2011-06-12 NOTE — Assessment & Plan Note (Signed)
Mild improvement only. Offered sports medicine consult. Will consider and call back.

## 2011-06-12 NOTE — Progress Notes (Signed)
  Subjective:    Patient ID: Glenn Wheeler, male    DOB: March 31, 1955, 56 y.o.   MRN: [id number]  HPI Pt presents to clinic for evaluation of cough. Notes 2 days h/o head congestion, hoarseness and cough productive for clear sputum. No f/c or dyspnea. Also h/o left lateral epicondylitis- avoiding po nsaids, s/p voltaren gel and states now s/p steroid injxn to the area. Has noted mild improvement only.   Past Medical History  Diagnosis Date  . History of chicken pox     childhood  . Hyperlipidemia   . Colon polyps     2009   Past Surgical History  Procedure Date  . Tendon repair 2005    right perineal tendon-[facility]  . Rotator cuff repair 2007    left [facility]  . Cataract extraction, bilateral 2009    [facility]    reports that he has quit smoking. He has never used smokeless tobacco. He reports that he does not drink alcohol or use illicit drugs. family history includes Hyperlipidemia in his mother; Hypertension in his mother; Prostate cancer in his father; and Stroke in his paternal grandfather. No Known Allergies   Review of Systems see hpi     Objective:   Physical Exam  Nursing note and vitals reviewed. Constitutional: He appears well-developed and well-nourished. No distress.  HENT:  Head: Normocephalic and atraumatic.  Right Ear: External ear normal.  Left Ear: External ear normal.  Mouth/Throat: Oropharynx is clear and moist. No oropharyngeal exudate.  Eyes: Conjunctivae are normal. No scleral icterus.  Neck: Neck supple.  Cardiovascular: Normal rate, regular rhythm and normal heart sounds.   Pulmonary/Chest: Effort normal and breath sounds normal. No respiratory distress. He has no wheezes. He has no rales.  Neurological: He is alert.  Skin: He is not diaphoretic.  Psychiatric: He has a normal mood and affect.          Assessment & Plan:

## 2011-06-20 ENCOUNTER — Other Ambulatory Visit: Payer: Self-pay | Admitting: Internal Medicine

## 2011-06-20 ENCOUNTER — Telehealth: Payer: Self-pay | Admitting: Internal Medicine

## 2011-06-20 DIAGNOSIS — M7710 Lateral epicondylitis, unspecified elbow: Secondary | ICD-10-CM

## 2011-06-20 NOTE — Telephone Encounter (Signed)
Call placed to patient at [phone], he was advised of referral placed for ortho. He state that has already been contacted regarding the appointment date and time.

## 2011-06-20 NOTE — Telephone Encounter (Signed)
Referral order done

## 2011-06-20 NOTE — Telephone Encounter (Signed)
Patient would like to be referred to an orthopedic surgeon. He states that he has already talked to Dr. Rodena Medin about this and Dr. Rodena Medin said that he would refer him to Dr. Pearletha Forge.

## 2011-06-23 ENCOUNTER — Encounter: Payer: Self-pay | Admitting: Family Medicine

## 2011-06-23 ENCOUNTER — Ambulatory Visit (INDEPENDENT_AMBULATORY_CARE_PROVIDER_SITE_OTHER): Payer: Managed Care, Other (non HMO) | Admitting: Family Medicine

## 2011-06-23 VITALS — BP 114/76 | HR 76 | Ht 72.0 in | Wt 222.0 lb

## 2011-06-23 DIAGNOSIS — M7710 Lateral epicondylitis, unspecified elbow: Secondary | ICD-10-CM

## 2011-06-23 DIAGNOSIS — M7712 Lateral epicondylitis, left elbow: Secondary | ICD-10-CM

## 2011-06-23 DIAGNOSIS — M25529 Pain in unspecified elbow: Secondary | ICD-10-CM

## 2011-06-23 NOTE — Patient Instructions (Signed)
You have lateral epicondylitis (tennis elbow) Try to avoid painful activities as much as possible. Ice the area 3-4 times a day for 15 minutes at a time. Tylenol as needed for pain. Counterforce brace as directed can help unload area - wear this regularly if it provides you with relief. Start formal physical therapy and transition to a home exercise program - do this daily. Consider repeat injection for short term pain relief if the above is not helping. Surgery is a consideration if exercises and injection are not providing enough relief. Follow up with me in 6 weeks for reevaluation.

## 2011-06-23 NOTE — Assessment & Plan Note (Signed)
start PT with transition to HEP.  Counterforce brace provided.  Icing, tylenol as needed, relative rest.  Handout provided.  Consider repeat injection if not improving over 6 weeks as expected.  See instructions for further.

## 2011-06-23 NOTE — Progress Notes (Signed)
Subjective:    Patient ID: Glenn Wheeler, male    DOB: 11/09/1955, 56 y.o.   MRN: [id number]  PCP: Dr. Rodena Medin  HPI 56 yo M here for left elbow pain.  Patient denies known injury. States about 2 months ago started developing lateral elbow pain. Pain worse with picking items up, wrist extension, and twisting motion (opening doors, jars motion). Tried voltaren gel without benefit. 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 Tried some icing. Has a large sleeve he wore over elbow which didn't help. Has not tried PT or any home exercises. No prior issues with elbow.  Past Medical History  Diagnosis Date  . History of chicken pox     childhood  . Hyperlipidemia   . Colon polyps     2009  . Pulmonary embolus     Current Outpatient Prescriptions on File Prior to Visit  Medication Sig Dispense Refill  . acetaminophen (TYLENOL) 100 MG/ML solution Take 10 mg/kg by mouth every 4 (four) hours as needed.      Marland Kitchen atorvastatin (LIPITOR) 20 MG tablet Take 1 tablet (20 mg total) by mouth daily.  30 tablet  3  . Rivaroxaban (XARELTO) 20 MG TABS Take 20 mg by mouth daily.  30 tablet  6  . DISCONTD: fluticasone (FLONASE) 50 MCG/ACT nasal spray Place 1 spray into the nose daily.  16 g  3    Past Surgical History  Procedure Date  . Tendon repair 2005    right peroneal tendon-[facility]  . Rotator cuff repair 2007    left [facility]  . Cataract extraction, bilateral 2009    [facility]    No Known Allergies  History   Social History  . Marital Status: Married    Spouse Name: N/A    Number of Children: N/A  . Years of Education: N/A   Occupational History  . Not on file.   Social History Main Topics  . Smoking status: Former Games developer  . Smokeless tobacco: Never Used   Comment: quit 1984 1/2 ppd for 4 years  . Alcohol Use: No  . Drug Use: No  . Sexually Active:  Not on file   Other Topics Concern  . Not on file   Social History Narrative  . No narrative on file    Family History  Problem Relation Age of Onset  . Prostate cancer Father     alive  . Hyperlipidemia Mother   . Hypertension Mother   . Diabetes Mother   . Stroke Paternal Grandfather   . Heart attack Neg Hx   . Sudden death Neg Hx     BP 114/76  Pulse 76  Ht 6' (1.829 m)  Wt 222 lb (100.699 kg)  BMI 30.11 kg/m2  Review of Systems See HPI above.    Objective:   Physical Exam Gen: NAD  L elbow: No gross deformity, swelling, bruising. TTP at lateral epicondyle and less within extensor mass of forearm.  No other TTP about elbow. FROM. Strength 5/5 with elbow flexion and extension. Pain reproduced with supination, wrist extension and 3rd digit extension. Collateral ligaments intact. NVI distally. Negative tinels cubital and radial tunnels.     Assessment & Plan:  1. Left lateral epicondylitis - start PT with transition to HEP.  Counterforce brace provided.  Icing, tylenol as needed, relative rest.  Handout provided.  Consider repeat injection if not improving over 6  weeks as expected.  See instructions for further.

## 2011-07-05 ENCOUNTER — Ambulatory Visit: Payer: Managed Care, Other (non HMO) | Attending: Family Medicine | Admitting: Physical Therapy

## 2011-07-05 DIAGNOSIS — IMO0001 Reserved for inherently not codable concepts without codable children: Secondary | ICD-10-CM | POA: Insufficient documentation

## 2011-07-05 DIAGNOSIS — M25539 Pain in unspecified wrist: Secondary | ICD-10-CM | POA: Insufficient documentation

## 2011-07-21 ENCOUNTER — Ambulatory Visit: Payer: Managed Care, Other (non HMO) | Admitting: Physical Therapy

## 2011-07-28 ENCOUNTER — Ambulatory Visit: Payer: Managed Care, Other (non HMO) | Admitting: Physical Therapy

## 2011-08-04 ENCOUNTER — Ambulatory Visit: Payer: Managed Care, Other (non HMO) | Attending: Family Medicine | Admitting: Rehabilitation

## 2011-08-04 DIAGNOSIS — M25539 Pain in unspecified wrist: Secondary | ICD-10-CM | POA: Insufficient documentation

## 2011-08-04 DIAGNOSIS — IMO0001 Reserved for inherently not codable concepts without codable children: Secondary | ICD-10-CM | POA: Insufficient documentation

## 2011-08-10 ENCOUNTER — Ambulatory Visit: Payer: Managed Care, Other (non HMO) | Admitting: Hematology & Oncology

## 2011-08-10 ENCOUNTER — Other Ambulatory Visit: Payer: Managed Care, Other (non HMO) | Admitting: Lab

## 2011-08-11 ENCOUNTER — Ambulatory Visit: Payer: Managed Care, Other (non HMO) | Admitting: Rehabilitation

## 2011-08-11 ENCOUNTER — Ambulatory Visit (HOSPITAL_BASED_OUTPATIENT_CLINIC_OR_DEPARTMENT_OTHER): Payer: Managed Care, Other (non HMO) | Admitting: Hematology & Oncology

## 2011-08-11 ENCOUNTER — Other Ambulatory Visit (HOSPITAL_BASED_OUTPATIENT_CLINIC_OR_DEPARTMENT_OTHER): Payer: Managed Care, Other (non HMO) | Admitting: Lab

## 2011-08-11 VITALS — BP 106/67 | HR 86 | Temp 97.2°F | Wt 232.0 lb

## 2011-08-11 DIAGNOSIS — I2699 Other pulmonary embolism without acute cor pulmonale: Secondary | ICD-10-CM

## 2011-08-11 DIAGNOSIS — M7989 Other specified soft tissue disorders: Secondary | ICD-10-CM

## 2011-08-11 LAB — CBC WITH DIFFERENTIAL (CANCER CENTER ONLY)
BASO%: 0.4 % (ref 0.0–2.0)
Eosinophils Absolute: 0.1 10*3/uL (ref 0.0–0.5)
LYMPH%: 29.8 % (ref 14.0–48.0)
MCV: 86 fL (ref 82–98)
MONO#: 0.4 10*3/uL (ref 0.1–0.9)
MONO%: 7.8 % (ref 0.0–13.0)
NEUT#: 2.9 10*3/uL (ref 1.5–6.5)
Platelets: 120 10*3/uL — ABNORMAL LOW (ref 145–400)
RBC: 4.88 10*6/uL (ref 4.20–5.70)
RDW: 13.8 % (ref 11.1–15.7)
WBC: 4.8 10*3/uL (ref 4.0–10.0)

## 2011-08-11 LAB — D-DIMER, QUANTITATIVE: D-Dimer, Quant: 0.55 ug/mL-FEU — ABNORMAL HIGH (ref 0.00–0.48)

## 2011-08-11 NOTE — Progress Notes (Signed)
This office note has been dictated.

## 2011-08-11 NOTE — Progress Notes (Signed)
CC:   Marguarite Arbour, MD  DIAGNOSIS:  Idiopathic pulmonary embolism.  CURRENT THERAPY:  Xarelto 20 mg p.o. daily (patient to be on for 2 years total.)  INTERIM HISTORY:  [name] comes in for followup.  We last saw him back in April.  Since then, he has been doing okay.  He has had no problems with cough or shortness of breath.  He still has some intermittent swelling in his right leg.  He does wear a compression stocking for this.  Of note, we did recheck his protein C and protein S levels.  These were all normal.  As such, he clearly does have an idiopathic hypercoagulable state.  He is not having any bleeding.  He has had no headache.  There is no double vision or blurred vision.  He has not noted any problems with nausea or vomiting.  His last D-dimer in back in April was 0.26.  PHYSICAL EXAMINATION:  This is a well-developed, well-nourished white gentleman in no obvious distress.  Vital Signs 97.2, pulse 86, respiratory rate 18, blood pressure 106/67.  Weight is 232.  Head and neck:  Normocephalic, atraumatic skull.  There are no ocular or oral lesions.  There are no palpable cervical or supraclavicular lymph nodes. Lungs:  Clear bilaterally.  Cardiac:  Regular rate and rhythm with a normal S1 and S2.  There are no murmurs, rubs or bruits.  Abdomen:  Soft with good bowel sounds.  There is no palpable abdominal mass.  There is no fluid wave.  No palpable hepatosplenomegaly.  Back:  No tenderness over the spine, ribs, or hips.  Extremities:  Some slight nonpitting edema of the right leg.  No venous cord is noted in the right leg.  He has good pulses in the distal extremities.  There is good range of motion of his joints.  Skin:  No rashes, ecchymosis or petechia.  LABORATORY STUDIES:  White cell count is 4.8, hemoglobin 15, hematocrit 42, platelet count is 120.  IMPRESSION:  [name] is a 56 year old gentleman with a pulmonary embolism.  This happened in October 2012.  As  such, he will be on therapeutic anticoagulation through October 2014.  We will then get him on aspirin afterwards.  I do not see a need to do any radiological or vascular studies on him. He is totally asymptomatic.  He does have the intermittent swelling in his right leg, which is no surprise.  We will get him back to see [place] about 3 or 4 months now.  I do not see any need for any blood work in between visits.    ______________________________ [name], M.D. PRE/MEDQ  D:  08/11/2011  T:  08/11/2011  Job:  [id number]

## 2011-08-15 ENCOUNTER — Ambulatory Visit: Payer: Managed Care, Other (non HMO) | Admitting: Internal Medicine

## 2011-08-18 ENCOUNTER — Ambulatory Visit (INDEPENDENT_AMBULATORY_CARE_PROVIDER_SITE_OTHER): Payer: Managed Care, Other (non HMO) | Admitting: Internal Medicine

## 2011-08-18 ENCOUNTER — Ambulatory Visit: Payer: Managed Care, Other (non HMO) | Admitting: Rehabilitation

## 2011-08-18 ENCOUNTER — Encounter: Payer: Self-pay | Admitting: Internal Medicine

## 2011-08-18 VITALS — BP 108/74 | HR 75 | Temp 98.1°F | Resp 16 | Wt 225.5 lb

## 2011-08-18 DIAGNOSIS — IMO0001 Reserved for inherently not codable concepts without codable children: Secondary | ICD-10-CM

## 2011-08-18 DIAGNOSIS — E785 Hyperlipidemia, unspecified: Secondary | ICD-10-CM

## 2011-08-18 DIAGNOSIS — M791 Myalgia, unspecified site: Secondary | ICD-10-CM

## 2011-08-18 DIAGNOSIS — K644 Residual hemorrhoidal skin tags: Secondary | ICD-10-CM

## 2011-08-18 NOTE — Assessment & Plan Note (Signed)
Obtain lipid/lft. 

## 2011-08-18 NOTE — Patient Instructions (Signed)
Please schedule fasting labs for next Friday (ck-myalgia, lipid/lft-272.4) Also please schedule fasting labs prior to next visit (cbc-v58.69, lipid/lft-272.4 and psa-prostate cancer screening)

## 2011-08-18 NOTE — Assessment & Plan Note (Signed)
Obtain ck enzyme. Hold statin ~2wks to determine if sx's related.

## 2011-08-18 NOTE — Progress Notes (Signed)
  Subjective:    Patient ID: Glenn Wheeler, male    DOB: [birth year], 56 y.o.   MRN: [id number]  HPI Pt presents to clinic for followup of multiple medical problems. Notes several day h/o acute onset red papule left lateral nose. Has spontaneously improved. No purulent drainage. Chronically taking lipitor but does note leg myalgias. No obvious trigger. Weight down 6lbs and attempting goal of ~199. Left lateral epicondylitis some improved with PT.  Past Medical History  Diagnosis Date  . History of chicken pox     childhood  . Hyperlipidemia   . Colon polyps     2009  . Pulmonary embolus    Past Surgical History  Procedure Date  . Tendon repair 2005    right peroneal tendon-[facility]  . Rotator cuff repair 2007    left rotator cuff[facility]  . Cataract extraction, bilateral 2009    [facility]    reports that he has quit smoking. He has never used smokeless tobacco. He reports that he does not drink alcohol or use illicit drugs. family history includes Diabetes in his mother; Hyperlipidemia in his mother; Hypertension in his mother; Prostate cancer in his father; and Stroke in his paternal grandfather.  There is no history of Heart attack and Sudden death. No Known Allergies    Review of Systems see hpi     Objective:   Physical Exam   Physical Exam  Nursing note and vitals reviewed. Constitutional: Appears well-developed and well-nourished. No distress.  HENT:  Head: Normocephalic and atraumatic.  Right Ear: External ear normal.  Left Ear: External ear normal.  Eyes: Conjunctivae are normal. No scleral icterus.  Neck: Neck supple. Carotid bruit is not present.  Cardiovascular: Normal rate, regular rhythm and normal heart sounds.  Exam reveals no gallop and no friction rub.   No murmur heard. Pulmonary/Chest: Effort normal and breath sounds normal. No respiratory distress. He has no wheezes. no rales.  Lymphadenopathy:    He  has no cervical adenopathy.  Neurological:Alert.  Skin: Skin is warm and dry. Not diaphoretic. irritated red papule left lateral nose. No drainage Psychiatric: Has a normal mood and affect.       Assessment & Plan:

## 2011-08-25 ENCOUNTER — Ambulatory Visit: Payer: Managed Care, Other (non HMO) | Admitting: Physical Therapy

## 2011-08-29 LAB — HEPATIC FUNCTION PANEL
Albumin: 4.5 g/dL (ref 3.5–5.2)
Alkaline Phosphatase: 63 U/L (ref 39–117)
Total Bilirubin: 0.5 mg/dL (ref 0.3–1.2)

## 2011-08-29 NOTE — Telephone Encounter (Signed)
Pt presented to the lab, future orders released. 

## 2011-08-29 NOTE — Addendum Note (Signed)
Addended by: Mervin Kung A on: 08/29/2011 08:18 AM   Modules accepted: Orders

## 2011-08-30 LAB — CBC
HCT: 42.4 % (ref 39.0–52.0)
Hemoglobin: 14.6 g/dL (ref 13.0–17.0)
MCHC: 34.4 g/dL (ref 30.0–36.0)

## 2011-08-30 LAB — CHOLESTEROL, TOTAL: Cholesterol: 172 mg/dL (ref 0–200)

## 2011-09-01 ENCOUNTER — Ambulatory Visit: Payer: Managed Care, Other (non HMO) | Attending: Family Medicine | Admitting: Rehabilitation

## 2011-09-01 DIAGNOSIS — IMO0001 Reserved for inherently not codable concepts without codable children: Secondary | ICD-10-CM | POA: Insufficient documentation

## 2011-09-01 DIAGNOSIS — M25539 Pain in unspecified wrist: Secondary | ICD-10-CM | POA: Insufficient documentation

## 2011-09-15 ENCOUNTER — Ambulatory Visit: Payer: Managed Care, Other (non HMO) | Admitting: Rehabilitation

## 2011-09-18 ENCOUNTER — Emergency Department (HOSPITAL_BASED_OUTPATIENT_CLINIC_OR_DEPARTMENT_OTHER)
Admission: EM | Admit: 2011-09-18 | Discharge: 2011-09-18 | Disposition: A | Discharge status: Home / Self Care | Payer: Managed Care, Other (non HMO) | Attending: Emergency Medicine | Admitting: Emergency Medicine

## 2011-09-18 ENCOUNTER — Encounter (HOSPITAL_BASED_OUTPATIENT_CLINIC_OR_DEPARTMENT_OTHER): Payer: Self-pay | Admitting: *Deleted

## 2011-09-18 DIAGNOSIS — J3489 Other specified disorders of nose and nasal sinuses: Secondary | ICD-10-CM | POA: Insufficient documentation

## 2011-09-18 DIAGNOSIS — Z86711 Personal history of pulmonary embolism: Secondary | ICD-10-CM | POA: Insufficient documentation

## 2011-09-18 DIAGNOSIS — Z87891 Personal history of nicotine dependence: Secondary | ICD-10-CM | POA: Insufficient documentation

## 2011-09-18 DIAGNOSIS — E785 Hyperlipidemia, unspecified: Secondary | ICD-10-CM | POA: Insufficient documentation

## 2011-09-18 MED ORDER — PHENYLEPHRINE HCL 0.5 % NA SOLN
1.0000 [drp] | Freq: Three times a day (TID) | NASAL | Status: DC
  Administered 2011-09-18: 1 [drp] via NASAL
  Filled 2011-09-18: qty 15

## 2011-09-18 NOTE — ED Provider Notes (Signed)
History  This chart was scribed for Gwyneth Sprout, MD by Bennett Scrape. This patient was seen in room MH06/MH06 and the patient's care was started at 10:16PM.  CSN: [id number]  Arrival date & time 09/18/11  2106   First MD Initiated Contact with Patient 09/18/11 2216      Chief Complaint  Patient presents with  . Facial Pain    The history is provided by the patient. No language interpreter was used.    Glenn Wheeler is a 56 y.o. male who presents to the Emergency Department requesting to have packing from his left nare removed due to increased facial pain. Pt reports that he had the packing along with antibiotic cream placed in his left nare 4 days ago by his ENT to help treat recurrent nasal ulcers that he has been dealing with for the past 6 months after being placed on Xarelto. He states that the ulcers will scab up and then fall out when he blows his nose causing them to re-bleed. He reports that he has tried pulling the packing out with tweezers but reports that it was too painful. He states that he is currently on Xarelto due to a PE in October 2012. He states that the possible causes of the PE are a long airplane ride or a type of bacterial infection that he contracted while living in [place]. He denies fever, sore throat, visual disturbance, CP, SOB, abdominal pain, urinary symptoms, back pain, HA, and rash as associated symptoms. He also has a HLD and is a former smoker but denies alcohol use.  Past Medical History  Diagnosis Date  . History of chicken pox     childhood  . Hyperlipidemia   . Colon polyps     2009  . Pulmonary embolus     Past Surgical History  Procedure Date  . Tendon repair 2005    right peroneal tendon-[facility]  . Rotator cuff repair 2007    left rotator cuff[facility]  . Cataract extraction, bilateral 2009    [facility]    Family History  Problem Relation Age of Onset  . Prostate cancer Father    alive  . Hyperlipidemia Mother   . Hypertension Mother   . Diabetes Mother   . Stroke Paternal Grandfather   . Heart attack Neg Hx   . Sudden death Neg Hx     History  Substance Use Topics  . Smoking status: Former Games developer  . Smokeless tobacco: Never Used   Comment: quit 1984 1/2 ppd for 4 years  . Alcohol Use: No      Review of Systems  A complete 10 system review of systems was obtained and all systems are negative except as noted in the HPI and PMH.   Allergies  Review of patient's allergies indicates no known allergies.  Home Medications   Current Outpatient Rx  Name Route Sig Dispense Refill  . ACETAMINOPHEN 100 MG/ML PO SOLN Oral Take 10 mg/kg by mouth every 4 (four) hours as needed.    . ATORVASTATIN CALCIUM 20 MG PO TABS Oral Take 1 tablet (20 mg total) by mouth daily. 30 tablet 3  . RIVAROXABAN 20 MG PO TABS Oral Take 20 mg by mouth daily. 30 tablet 6    Triage Vitals: BP 122/77  Pulse 70  Temp 98 F (36.7 C) (Oral)  Resp 20  SpO2 98%  Physical Exam  Nursing note and vitals reviewed. Constitutional: He is oriented to person, place, and time.  He appears well-developed and well-nourished. No distress.  HENT:  Head: Normocephalic and atraumatic.       Small septal ulcer on the left with no active bleeding  Eyes: Conjunctivae and EOM are normal.  Neck: Neck supple. No tracheal deviation present.  Cardiovascular: Normal rate.   Pulmonary/Chest: Effort normal. No respiratory distress.  Musculoskeletal: Normal range of motion.  Neurological: He is alert and oriented to person, place, and time.  Skin: Skin is warm and dry.  Psychiatric: He has a normal mood and affect. His behavior is normal.    ED Course  Procedures (including critical care time)  DIAGNOSTIC STUDIES: Oxygen Saturation is 98% on room air, normal by my interpretation.    COORDINATION OF CARE: 10:33PM-Left nare packing was removed with saline and tweezers. 10:33PM-Discussed treatment  plan which includes nasal spray with pt at bedside and pt agreed to plan.  Labs Reviewed - No data to display No results found.   1. Nasal pain       MDM   Patient with nasal packing that was placed on Friday but due to significant amount of pain he was unable to remove it at home and came here for removal. Patient states that he's had intermittent nasal bleeding and the packing was placed to heal an ulcer. Packing was removed here without any evidence of bleeding. Septal ulcer noted crepitus hemostatic. Patient to followup with ENT.  I personally performed the services described in this documentation, which was scribed in my presence.  The recorded information has been reviewed and considered.       Gwyneth Sprout, MD 09/18/11 2243

## 2011-09-18 NOTE — ED Notes (Signed)
Packing in his left nare x 72 hours. His ENT is treating rough areas inside his nose with packing to prepare for nasal septum repair. He cannot stand the pain and wants the packing taken out. States he is changing ENT's.

## 2011-09-22 ENCOUNTER — Other Ambulatory Visit: Payer: Self-pay | Admitting: *Deleted

## 2011-09-22 ENCOUNTER — Telehealth: Payer: Self-pay | Admitting: Internal Medicine

## 2011-09-22 MED ORDER — ATORVASTATIN CALCIUM 20 MG PO TABS: 20.0000 mg | ORAL_TABLET | Freq: Every day | ORAL | Status: DC

## 2011-09-22 NOTE — Telephone Encounter (Signed)
Refill- atorvastatin 20mg  tablet. Take one tablet by mouth daily. Qty 30 last fill 3.14.13

## 2011-09-22 NOTE — Telephone Encounter (Signed)
Sent to pharmacy... 09/22/11@9 :32am/LMB

## 2011-10-27 ENCOUNTER — Other Ambulatory Visit: Payer: Self-pay | Admitting: *Deleted

## 2011-10-27 DIAGNOSIS — I2699 Other pulmonary embolism without acute cor pulmonale: Secondary | ICD-10-CM

## 2011-10-27 MED ORDER — RIVAROXABAN 20 MG PO TABS: 20.0000 mg | ORAL_TABLET | Freq: Every day | ORAL | Status: DC

## 2011-11-17 ENCOUNTER — Ambulatory Visit (HOSPITAL_BASED_OUTPATIENT_CLINIC_OR_DEPARTMENT_OTHER): Payer: Managed Care, Other (non HMO) | Admitting: Hematology & Oncology

## 2011-11-17 ENCOUNTER — Other Ambulatory Visit (HOSPITAL_BASED_OUTPATIENT_CLINIC_OR_DEPARTMENT_OTHER): Payer: Managed Care, Other (non HMO) | Admitting: Lab

## 2011-11-17 VITALS — BP 115/71 | HR 76 | Temp 98.2°F | Resp 18 | Ht 72.0 in | Wt 232.0 lb

## 2011-11-17 DIAGNOSIS — I2699 Other pulmonary embolism without acute cor pulmonale: Secondary | ICD-10-CM

## 2011-11-17 LAB — CBC WITH DIFFERENTIAL (CANCER CENTER ONLY)
BASO#: 0 10*3/uL (ref 0.0–0.2)
Eosinophils Absolute: 0.1 10*3/uL (ref 0.0–0.5)
HCT: 41.7 % (ref 38.7–49.9)
HGB: 14.7 g/dL (ref 13.0–17.1)
LYMPH%: 34.6 % (ref 14.0–48.0)
MCH: 30 pg (ref 28.0–33.4)
MCV: 85 fL (ref 82–98)
MONO%: 9.1 % (ref 0.0–13.0)
NEUT%: 53.9 % (ref 40.0–80.0)
Platelets: 123 10*3/uL — ABNORMAL LOW (ref 145–400)
RBC: 4.9 10*6/uL (ref 4.20–5.70)

## 2011-11-17 NOTE — Progress Notes (Signed)
This office note has been dictated.

## 2011-11-17 NOTE — Progress Notes (Signed)
CC:   Glenn Arbour, MD  DIAGNOSIS:  Pulmonary embolism, idiopathic.  CURRENT THERAPY:  Xarelto 20 mg p.o. daily (the patient to be treated for 2 years).  INTERIM HISTORY:  Glenn Wheeler comes in for followup.  He has done well.  He had a good summer.  He is staying busy.  He is working at one of the [employer].  He is also doing mission work.  He has not gone back overseas.  His cutaneous leishmaniasis is not an issue for him right now.  He has had no problems with bleeding.  He has had no rashes.  He has had no change in bowel or bladder habits.  He has had no cough.  He has had no headache.  His last CT scan done back in March showed a very small residual thrombus within 2 subsegmental branches of the left lower lobe pulmonary artery.  Otherwise everything else looked okay.  He had bilateral lower extremity Dopplers done also in March.  These came back normal.  His hypercoagulable panels have all been negative.  PHYSICAL EXAMINATION:  General:  This is a well-developed, well- nourished white gentleman, in no obvious distress.  Vital signs: Temperature of 98.2, pulse 76, respiratory rate 18, blood pressure 115/71.  Weight is 232.  Head and neck:  Normocephalic, atraumatic skull.  There are no ocular or oral lesions.  There are no palpable cervical or supraclavicular lymph nodes.  Lungs:  Clear bilaterally. Cardiac:  Regular rate and rhythm, with a normal S1 and S2.  There are no murmurs, rubs or bruits.  Abdomen:  Soft, with good bowel sounds. There is no palpable abdominal mass.  There is no palpable hepatosplenomegaly.  Extremities:  No clubbing, cyanosis, or edema. Neurological:  No focal neurological deficits.  LABORATORY STUDIES:  White cell count is 4.1, hemoglobin 14.7, hematocrit 41.7, platelet count 123,000.  IMPRESSION:  Glenn Wheeler is a nice 56 year old gentleman with an idiopathic pulmonary embolism.  He was actually diagnosed back in October 2012.  So far, he has  done quite well.  He is having to be on Xarelto.  He did tell me that there may be some issues with him changing insurance companies and them not being able to pay for Xarelto.  If this were the case, then we might have to consider an alternative.  Hopefully, this will not be a problem.  I think we can probably get Glenn Wheeler back in about 3 or 4 months.  I do not see that we need to do any blood work in between visits.    ______________________________ [name], M.D. PRE/MEDQ  D:  11/17/2011  T:  11/17/2011  Job:  [id number]

## 2011-12-01 ENCOUNTER — Encounter: Payer: Self-pay | Admitting: Internal Medicine

## 2011-12-01 ENCOUNTER — Ambulatory Visit (INDEPENDENT_AMBULATORY_CARE_PROVIDER_SITE_OTHER): Payer: Managed Care, Other (non HMO) | Admitting: Internal Medicine

## 2011-12-01 VITALS — BP 102/70 | HR 88 | Temp 97.9°F | Resp 16 | Wt 232.1 lb

## 2011-12-01 DIAGNOSIS — R6882 Decreased libido: Secondary | ICD-10-CM

## 2011-12-01 DIAGNOSIS — N529 Male erectile dysfunction, unspecified: Secondary | ICD-10-CM

## 2011-12-01 MED ORDER — VARDENAFIL HCL 10 MG PO TABS: 10.0000 mg | ORAL_TABLET | Freq: Every day | ORAL | Status: DC | PRN

## 2011-12-01 NOTE — Progress Notes (Signed)
  Subjective:    Patient ID: Glenn Wheeler, male    DOB: 07-17-1955, 56 y.o.   MRN: [id number]  HPI Pt presents to clinic for evaluation of erectile dysfunction. There is also decreased libido. Overall duration as been approximately one year. No alleviating or exacerbating factors. Taking no medication for the problem. Total time of visit approximately 20 minutes of which greater than 50% of time was spent in counseling.  Past Medical History  Diagnosis Date  . History of chicken pox     childhood  . Hyperlipidemia   . Colon polyps     2009  . Pulmonary embolus    Past Surgical History  Procedure Date  . Tendon repair 2005    right peroneal tendon-[facility]  . Rotator cuff repair 2007    left [facility] cuff[facility]  . Cataract extraction, bilateral 2009    [facility]    reports that he has quit smoking. He has never used smokeless tobacco. He reports that he does not drink alcohol or use illicit drugs. family history includes Diabetes in his mother; Hyperlipidemia in his mother; Hypertension in his mother; Prostate cancer in his father; and Stroke in his paternal grandfather.  There is no history of Heart attack and Sudden death. No Known Allergies   Review of Systems see hpi     Objective:   Physical Exam  Nursing note and vitals reviewed. Constitutional: He appears well-developed and well-nourished. No distress.  HENT:  Head: Normocephalic and atraumatic.  Neurological: He is alert.  Skin: He is not diaphoretic.  Psychiatric: He has a normal mood and affect.          Assessment & Plan:

## 2011-12-03 DIAGNOSIS — N529 Male erectile dysfunction, unspecified: Secondary | ICD-10-CM | POA: Insufficient documentation

## 2011-12-03 NOTE — Assessment & Plan Note (Signed)
Obtain testosterone level. Given prescription for Levitra when necessary with dosing instructions.

## 2011-12-08 ENCOUNTER — Telehealth: Payer: Self-pay | Admitting: *Deleted

## 2011-12-08 DIAGNOSIS — E291 Testicular hypofunction: Secondary | ICD-10-CM

## 2011-12-08 DIAGNOSIS — Z79899 Other long term (current) drug therapy: Secondary | ICD-10-CM

## 2011-12-08 MED ORDER — TESTOSTERONE 50 MG/5GM (1%) TD GEL: 5.0000 g | Freq: Every day | TRANSDERMAL | Status: DC

## 2011-12-08 NOTE — Telephone Encounter (Signed)
Message copied by Regis Bill on Fri Dec 08, 2011  5:45 PM ------      Message from: Edwyna Perfect      Created: Thu Dec 07, 2011  9:39 PM       Testosterone is very low. Ok to begin treatment. androgel 5 pumps to arm daily.       1) return to lab before treatment for fsh, lh dx- hypogonadism      2) return to lab 1 month after beginnning treatment for early am testosterone, lft and psa. Dx-hypogonadism and v58.69

## 2011-12-08 NOTE — Telephone Encounter (Signed)
Patient informed, understood & agreed; new Rx to pharmacy Future lab orders placed/SLS

## 2011-12-11 ENCOUNTER — Telehealth: Payer: Self-pay | Admitting: Internal Medicine

## 2011-12-11 ENCOUNTER — Other Ambulatory Visit: Payer: Self-pay | Admitting: *Deleted

## 2011-12-11 MED ORDER — TESTOSTERONE 20.25 MG/ACT (1.62%) TD GEL: 2.0000 | Freq: Every day | TRANSDERMAL | Status: DC

## 2011-12-11 NOTE — Progress Notes (Signed)
Gave correction to Rx via phone w/Matt at pharmacy/SLS

## 2011-12-11 NOTE — Telephone Encounter (Signed)
Refill androgel 1% gel 25mg  30x2.5 gm apply 5 grams daily last fill 12-08-2011

## 2011-12-12 NOTE — Addendum Note (Signed)
Addended by: Regis Bill on: 12/12/2011 10:36 AM   Modules accepted: Orders

## 2011-12-12 NOTE — Telephone Encounter (Signed)
Lab orders released/SLS 

## 2011-12-12 NOTE — Telephone Encounter (Signed)
Rx done via phone w/pharmacy 11.11.13/SLS

## 2012-01-01 ENCOUNTER — Telehealth: Payer: Self-pay | Admitting: Internal Medicine

## 2012-01-01 ENCOUNTER — Encounter: Payer: Self-pay | Admitting: Internal Medicine

## 2012-01-01 ENCOUNTER — Other Ambulatory Visit: Payer: Self-pay | Admitting: Internal Medicine

## 2012-01-01 DIAGNOSIS — K635 Polyp of colon: Secondary | ICD-10-CM

## 2012-01-01 NOTE — Telephone Encounter (Signed)
Patient would like to be schedule for colonoscopy  , hx of colon polyps

## 2012-01-01 NOTE — Telephone Encounter (Signed)
Informed patient/SLS

## 2012-01-01 NOTE — Telephone Encounter (Signed)
Referral order placed.

## 2012-01-05 ENCOUNTER — Telehealth: Payer: Self-pay | Admitting: *Deleted

## 2012-01-05 NOTE — Telephone Encounter (Signed)
Patient called stating that he has been having lower back pain (not excruciating)for the past 2 days.  He has had periodic pain in his lung not associated with breathing but associated with movement.  Does not hurt when he breathes.  Spoke with Dr. Myna Hidalgo.  Patient can see his primary care physician today if he is concerned about this or if this continues Dr. Myna Hidalgo will see patient.  Patient assured me he is having no other problems such as shortness of breath and other than occasional discomfort feels totally fine.  Relayed this to patient.  Patient understands instructions

## 2012-01-08 ENCOUNTER — Ambulatory Visit (INDEPENDENT_AMBULATORY_CARE_PROVIDER_SITE_OTHER): Payer: Managed Care, Other (non HMO) | Admitting: Physician Assistant

## 2012-01-08 ENCOUNTER — Encounter: Payer: Self-pay | Admitting: Physician Assistant

## 2012-01-08 VITALS — BP 110/60 | HR 76 | Ht 72.0 in | Wt 228.8 lb

## 2012-01-08 DIAGNOSIS — Z7901 Long term (current) use of anticoagulants: Secondary | ICD-10-CM

## 2012-01-08 DIAGNOSIS — Z86010 Personal history of colonic polyps: Secondary | ICD-10-CM

## 2012-01-08 DIAGNOSIS — Z860101 Personal history of adenomatous and serrated colon polyps: Secondary | ICD-10-CM | POA: Insufficient documentation

## 2012-01-08 LAB — HEPATIC FUNCTION PANEL
ALT: 24 U/L (ref 0–53)
AST: 28 U/L (ref 0–37)
Bilirubin, Direct: 0.1 mg/dL (ref 0.0–0.3)
Indirect Bilirubin: 0.7 mg/dL (ref 0.0–0.9)

## 2012-01-08 NOTE — Patient Instructions (Addendum)
We will cancel the colonoscopy scheduled with Dr. Leone Payor.   You will get a reminder letter for 2014 for scheduling the colonoscopy in October or November.

## 2012-01-08 NOTE — Progress Notes (Signed)
Subjective:    Patient ID: Glenn Wheeler, male    DOB: 09/14/1955, 56 y.o.   MRN: [id number]  HPI Glenn Wheeler is a 56 -year-old white male referred today to discuss followup colonoscopy. 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 He reports having polyps on both colonoscopies but believes they were hyperplastic . He currently has no GI symptoms. He was diagnosed with a pulmonary embolism a little over a year ago, and he is currently being treated with Xarelto, - plan is for a 2 year course. He is being followed by Dr. Myna Hidalgo. He is currently retired from [employer] work and is now serving as a Education officer, environmental here locally.      Review of Systems  Constitutional: Negative.   HENT: Negative.   Eyes: Negative.   Respiratory: Negative.   Cardiovascular: Negative.   Gastrointestinal: Negative.   Genitourinary: Negative.   Musculoskeletal: Negative.   Skin: Negative.   Neurological: Negative.   Hematological: Negative.   Psychiatric/Behavioral: Negative.    Outpatient Prescriptions Prior to Visit  Medication Sig Dispense Refill  . acetaminophen (TYLENOL) 100 MG/ML solution Take 10 mg/kg by mouth every 4 (four) hours as needed.      Marland Kitchen atorvastatin (LIPITOR) 20 MG tablet Take 1 tablet (20 mg total) by mouth daily.  30 tablet  3  . Rivaroxaban (XARELTO) 20 MG TABS Take 1 tablet (20 mg total) by mouth daily.  30 tablet  6  . Testosterone 20.25 MG/ACT (1.62%) GEL Place 2 Act onto the skin daily. (Two pumps to shoulder).  75 g  3  . [DISCONTINUED] vardenafil (LEVITRA) 10 MG tablet Take 1 tablet (10 mg total) by mouth daily as needed for erectile dysfunction.  6 tablet  3   Last reviewed on 01/08/2012 12:13 PM by Sammuel Cooper, PA  No Known Allergies   Patient Active Problem List  Diagnosis  . Pulmonary embolism  . Leishmaniasis, cutaneous, American  . Hyperlipidemia  . Obesity  . Left lateral  epicondylitis  . Myalgia  . External hemorrhoid  . Erectile dysfunction  . Hx of colonic polyps   History  Substance Use Topics  . Smoking status: Former Games developer  . Smokeless tobacco: Never Used     Comment: quit 1984 1/2 ppd for 4 years  . Alcohol Use: No   Family History  Problem Relation Age of Onset  . Prostate cancer Father     alive  . Hyperlipidemia Mother   . Hypertension Mother   . Diabetes Mother   . Stroke Paternal Grandfather   . Heart attack Neg Hx   . Sudden death Neg Hx   . Clotting disorder Father     blood clots  . Stroke Maternal Grandfather     Objective:   Physical Exam  discussion only not examined today        Assessment & Plan:  #35   56 year old male with history of probable hyperplastic colon polyps, last colonoscopy done 2007 in [place]. #2 Pulmonary  embolism October 2012, currently on a two-year planned course of Xarelto   Plan; discussion today with the patient regarding timing of surveillance colonoscopy. Certainly seems reasonable to wait until Oct/ November of 2014 for followup screening colonoscopy-when he will be off of Xarelto. Patient is in agreement, we will send him a reminder to schedule followup with Dr. Leone Wheeler in October 2014. Patient is aware that  if his situation changes and/or he needs to be seen for any other reason in the interim that we will be happy to see him in the office

## 2012-01-09 NOTE — Progress Notes (Signed)
Agree with Ms. Oswald Hillock assessment and plan. Iva Boop, MD, [facility]  He may not need colonoscopy in 2014 given the history but lack of records makes it somewhat difficult to sort out. Will discuss in 2014.

## 2012-01-12 ENCOUNTER — Encounter: Payer: Self-pay | Admitting: Internal Medicine

## 2012-01-12 ENCOUNTER — Telehealth: Payer: Self-pay | Admitting: Internal Medicine

## 2012-01-12 ENCOUNTER — Ambulatory Visit (INDEPENDENT_AMBULATORY_CARE_PROVIDER_SITE_OTHER): Payer: Managed Care, Other (non HMO) | Admitting: Internal Medicine

## 2012-01-12 VITALS — BP 112/68 | HR 73 | Temp 98.3°F | Resp 16 | Ht 72.0 in | Wt 230.5 lb

## 2012-01-12 DIAGNOSIS — E291 Testicular hypofunction: Secondary | ICD-10-CM

## 2012-01-12 DIAGNOSIS — Z79899 Other long term (current) drug therapy: Secondary | ICD-10-CM

## 2012-01-12 DIAGNOSIS — E785 Hyperlipidemia, unspecified: Secondary | ICD-10-CM

## 2012-01-12 DIAGNOSIS — I2699 Other pulmonary embolism without acute cor pulmonale: Secondary | ICD-10-CM

## 2012-01-12 DIAGNOSIS — N529 Male erectile dysfunction, unspecified: Secondary | ICD-10-CM

## 2012-01-12 NOTE — Telephone Encounter (Signed)
Please schedule fasting labs prior to next visit  Cbc, PSA-v58.69, lipid/lft-272.4, testosterone-hypogonadism  Patient has upcoming appointment mid-March. He will be going to [facility] lab

## 2012-01-12 NOTE — Assessment & Plan Note (Signed)
Clinically improved and testosterone now nl. Continue same dosing and f/u labs at next visit

## 2012-01-12 NOTE — Patient Instructions (Signed)
Please schedule fasting labs prior to next visit Cbc, PSA-v58.69, lipid/lft-272.4, testosterone-hypogonadism

## 2012-01-12 NOTE — Progress Notes (Signed)
  Subjective:    Patient ID: Glenn Wheeler, male    DOB: Jan 18, 1956, 56 y.o.   MRN: [id number]  HPI Pt presents to clinic for followup of multiple medical problems. Now taking testosterone supplement and reviewed nl testosterone, lft and psa. Tolerating well though has noted some mild increase in acne. Notes improved libido.  Past Medical History  Diagnosis Date  . History of chicken pox     childhood  . Hyperlipidemia   . Colon polyps     2001, 2007  . Pulmonary embolus   . Leishmaniasis 05-2011   Past Surgical History  Procedure Date  . Tendon repair 2005    right peroneal tendon-[facility]  . Rotator cuff repair 2007    [facility]  . Cataract extraction, bilateral 2009    [facility]    reports that he has quit smoking. He has never used smokeless tobacco. He reports that he does not drink alcohol or use illicit drugs. family history includes Clotting disorder in his father; Diabetes in his mother; Hyperlipidemia in his mother; Hypertension in his mother; Prostate cancer in his father; and Stroke in his maternal grandfather and paternal grandfather.  There is no history of Heart attack and Sudden death. No Known Allergies    Review of Systems see hpi     Objective:   Physical Exam  Nursing note and vitals reviewed. Constitutional: He appears well-developed and well-nourished. No distress.  Neurological: He is alert.  Skin: He is not diaphoretic.  Psychiatric: He has a normal mood and affect.          Assessment & Plan:

## 2012-01-15 ENCOUNTER — Telehealth: Payer: Self-pay | Admitting: Internal Medicine

## 2012-01-15 MED ORDER — ATORVASTATIN CALCIUM 20 MG PO TABS: 20.0000 mg | ORAL_TABLET | Freq: Every day | ORAL | Status: DC

## 2012-01-15 NOTE — Telephone Encounter (Signed)
Rx to pharmacy/SLS 

## 2012-01-15 NOTE — Telephone Encounter (Signed)
Refill- atorvastatin 20mg  tablets. Take one tablet by mouth daily. Qty 30 last fill 11.10.13

## 2012-01-29 ENCOUNTER — Encounter: Payer: Self-pay | Admitting: Internal Medicine

## 2012-02-16 ENCOUNTER — Telehealth: Payer: Self-pay | Admitting: *Deleted

## 2012-02-16 NOTE — Telephone Encounter (Signed)
lipitor is quite a bit stronger than lovastatin therefore lovastatin may not control as well. Also lipitor is generic and would surprise me if was expensive. Would honestly recommend if possible waiting for next lipid panel to decide

## 2012-02-16 NOTE — Telephone Encounter (Signed)
Patient reports that his Insurance has changed to [facility] [employer] [mail pharmacy] and that he usually gets his local prescriptions filled at [facility], but pricing has increased and he has checked the [facility] on [address] and [place] Form is listed; pt would like to know if it would be conceivable for his Lipitor to be changed to Lovastatin for cost purposes/SLS Please advise.

## 2012-02-16 NOTE — Telephone Encounter (Signed)
Patient informed, understood & agreed/SLS  

## 2012-03-06 ENCOUNTER — Telehealth: Payer: Self-pay | Admitting: Internal Medicine

## 2012-03-06 NOTE — Telephone Encounter (Signed)
I have the PA now. Will fill this out and get sent out.

## 2012-03-06 NOTE — Telephone Encounter (Signed)
Caller: Glenn Wheeler/Patient; Phone: [phone]; Reason for Call: Problem with Androgel Rx.  States insurance changed 01/31/12 from [place] to [facility].  States pharmacy needs new Rx written for [facility] to renew his Rx.  Per Epic, patient has active Rx written 12/11/11 for Androgel 1.  62%, 2 pumps daily to shoulder, 75gm, RF x 3 through 06/08/12.  TC to [facility]/[address] [phone].  Pharmacist states there is a Prior Auth issue with [employer] and is not covered with this new plan.  Info to office for staff/provider review/Rx change/callback.  May reach patient at [phone].  Krs/can

## 2012-03-06 NOTE — Telephone Encounter (Signed)
OK make sure he wants this strength, the other strength is cheaper if he wants to switch or try and get the prior auth, use same sig, disp same quantity of the 1.62% strength Androgel if insurance will let us have it.

## 2012-03-06 NOTE — Telephone Encounter (Signed)
Please advise new RX for Androgel

## 2012-03-08 ENCOUNTER — Telehealth: Payer: Self-pay | Admitting: Hematology & Oncology

## 2012-03-08 NOTE — Telephone Encounter (Signed)
Pt called moved 2-21 to 3-28 could only come on Friday's I offered him 2-28 but he refused

## 2012-03-22 ENCOUNTER — Ambulatory Visit: Payer: Self-pay | Admitting: Hematology & Oncology

## 2012-03-22 ENCOUNTER — Other Ambulatory Visit: Payer: Self-pay | Admitting: Lab

## 2012-03-26 ENCOUNTER — Telehealth: Payer: Self-pay | Admitting: *Deleted

## 2012-03-26 NOTE — Telephone Encounter (Signed)
Spoke with Felicia from [place] and she will be faxing over prior forms wihthin the hour

## 2012-03-27 ENCOUNTER — Telehealth: Payer: Self-pay

## 2012-03-27 NOTE — Telephone Encounter (Signed)
PA for Androgel was faxed to [facility]

## 2012-04-12 ENCOUNTER — Telehealth: Payer: Self-pay | Admitting: *Deleted

## 2012-04-12 DIAGNOSIS — I2699 Other pulmonary embolism without acute cor pulmonale: Secondary | ICD-10-CM

## 2012-04-12 DIAGNOSIS — E291 Testicular hypofunction: Secondary | ICD-10-CM

## 2012-04-12 DIAGNOSIS — Z79899 Other long term (current) drug therapy: Secondary | ICD-10-CM

## 2012-04-12 DIAGNOSIS — N529 Male erectile dysfunction, unspecified: Secondary | ICD-10-CM

## 2012-04-12 DIAGNOSIS — E785 Hyperlipidemia, unspecified: Secondary | ICD-10-CM

## 2012-04-12 LAB — CBC WITH DIFFERENTIAL/PLATELET
Basophils Relative: 1 % (ref 0–1)
Eosinophils Absolute: 0.1 10*3/uL (ref 0.0–0.7)
Hemoglobin: 15.5 g/dL (ref 13.0–17.0)
MCH: 29.6 pg (ref 26.0–34.0)
MCHC: 34.1 g/dL (ref 30.0–36.0)
Monocytes Relative: 10 % (ref 3–12)
Neutro Abs: 2.4 10*3/uL (ref 1.7–7.7)
Neutrophils Relative %: 57 % (ref 43–77)
Platelets: 147 10*3/uL — ABNORMAL LOW (ref 150–400)
RBC: 5.24 MIL/uL (ref 4.22–5.81)

## 2012-04-12 NOTE — Telephone Encounter (Signed)
Pt presented to the lab, future orders released. 

## 2012-04-12 NOTE — Telephone Encounter (Signed)
Lab orders entered/SLS 

## 2012-04-13 LAB — LIPID PANEL
Cholesterol: 189 mg/dL (ref 0–200)
Total CHOL/HDL Ratio: 5.1 Ratio

## 2012-04-13 LAB — PSA: PSA: 0.92 ng/mL (ref <=4.00)

## 2012-04-13 LAB — HEPATIC FUNCTION PANEL
ALT: 20 U/L (ref 0–53)
AST: 19 U/L (ref 0–37)
Alkaline Phosphatase: 65 U/L (ref 39–117)
Bilirubin, Direct: 0.2 mg/dL (ref 0.0–0.3)
Indirect Bilirubin: 0.6 mg/dL (ref 0.0–0.9)
Total Bilirubin: 0.8 mg/dL (ref 0.3–1.2)

## 2012-04-19 ENCOUNTER — Encounter: Payer: Self-pay | Admitting: Family

## 2012-04-19 ENCOUNTER — Ambulatory Visit (INDEPENDENT_AMBULATORY_CARE_PROVIDER_SITE_OTHER): Payer: BC Managed Care – PPO | Admitting: Family

## 2012-04-19 VITALS — BP 124/78 | HR 69 | Temp 97.8°F | Resp 16 | Ht 72.0 in | Wt 218.1 lb

## 2012-04-19 DIAGNOSIS — E291 Testicular hypofunction: Secondary | ICD-10-CM

## 2012-04-19 DIAGNOSIS — E785 Hyperlipidemia, unspecified: Secondary | ICD-10-CM

## 2012-04-19 DIAGNOSIS — I2699 Other pulmonary embolism without acute cor pulmonale: Secondary | ICD-10-CM

## 2012-04-19 NOTE — Patient Instructions (Addendum)
Please return to the lab in 3 months for repeat testosterone test. Follow up in 6 month for a follow up appointment.

## 2012-04-19 NOTE — Progress Notes (Signed)
Subjective:    Patient ID: Glenn Wheeler, male    DOB: 1955-08-28, 57 y.o.   MRN: [id number]  HPI  [name] is a 57 yr old male who presents today for follow up.  1) Hyperlipidemia- He continues lipitor.  Reports that he has no myalgia.    2) Hypogonadism- CBC is stable,  PSA normal and testosterone level was low at 160. He reports that he has not been taking the androgel every day.    3) Hx PE- on xarelto.  PE was in 10/2010. He follows with Dr. Myna Hidalgo who plans to anticoagulate him x 2 years.   Review of Systems See HPI  Past Medical History  Diagnosis Date  . History of chicken pox     childhood  . Hyperlipidemia   . Colon polyps     2001, 2007  . Pulmonary embolus   . Leishmaniasis 05-2011    History   Social History  . Marital Status: Married    Spouse Name: N/A    Number of Children: N/A  . Years of Education: N/A   Occupational History  . Not on file.   Social History Main Topics  . Smoking status: Former Games developer  . Smokeless tobacco: Never Used     Comment: quit 1984 1/2 ppd for 4 years  . Alcohol Use: No  . Drug Use: No  . Sexually Active: Not on file   Other Topics Concern  . Not on file   Social History Narrative  . No narrative on file    Past Surgical History  Procedure Laterality Date  . Tendon repair  2005    right peroneal tendon-[facility]  . Rotator cuff repair  2007    left [facility] cuff[facility]  . Cataract extraction, bilateral  2009    [facility]    Family History  Problem Relation Age of Onset  . Prostate cancer Father     alive  . Hyperlipidemia Mother   . Hypertension Mother   . Diabetes Mother   . Stroke Paternal Grandfather   . Heart attack Neg Hx   . Sudden death Neg Hx   . Clotting disorder Father     blood clots  . Stroke Maternal Grandfather     No Known Allergies  Current Outpatient Prescriptions on File Prior to Visit  Medication Sig Dispense Refill  .  acetaminophen (TYLENOL) 100 MG/ML solution Take 10 mg/kg by mouth every 4 (four) hours as needed.      Marland Kitchen atorvastatin (LIPITOR) 20 MG tablet Take 1 tablet (20 mg total) by mouth daily.  30 tablet  3  . Rivaroxaban (XARELTO) 20 MG TABS Take 1 tablet (20 mg total) by mouth daily.  30 tablet  6  . Testosterone 20.25 MG/ACT (1.62%) GEL Place 2 Act onto the skin daily. (Two pumps to shoulder).  75 g  3  . [DISCONTINUED] fluticasone (FLONASE) 50 MCG/ACT nasal spray Place 1 spray into the nose daily.  16 g  3   No current facility-administered medications on file prior to visit.    BP 124/78  Pulse 69  Temp(Src) 97.8 F (36.6 C) (Oral)  Resp 16  Ht 6' (1.829 m)  Wt 218 lb 1.9 oz (98.939 kg)  BMI 29.58 kg/m2  SpO2 97%       Objective:   Physical Exam  Constitutional: He is oriented to person, place, and time. He appears well-developed and well-nourished. No distress.  HENT:  Head: Normocephalic.  Cardiovascular:  Normal rate and regular rhythm.   No murmur heard. Pulmonary/Chest: Effort normal and breath sounds normal. No respiratory distress. He has no wheezes. He has no rales. He exhibits no tenderness.  Musculoskeletal: He exhibits no edema.  Neurological: He is alert and oriented to person, place, and time.  Skin: Skin is warm and dry.  Psychiatric: He has a normal mood and affect. His behavior is normal. Judgment and thought content normal.          Assessment & Plan:

## 2012-04-19 NOTE — Assessment & Plan Note (Signed)
LDL 128, HDL low at 37. We discussed that exercise can improve HDL.  Continue statin, LFT normal.

## 2012-04-19 NOTE — Assessment & Plan Note (Signed)
Cont. xarelto per hematology.

## 2012-04-19 NOTE — Assessment & Plan Note (Signed)
Testosterone level was normal 3 months ago when he was using androgel every day. I have instructed him to resume daily use and return to the lab in 3 months for follow up testosterone level.

## 2012-04-25 IMAGING — CT CT ANGIO CHEST
2 of 3 series · 19 of 32 positions shown · IV contrast (omnipaque)
Comparison: None.

CLINICAL DATA: History of PE diagnosed [DATE], some chest
pain/cough, evaluate for residual PE, on anticoagulation

CT ANGIOGRAPHY CHEST
TECHNIQUE: Multidetector CT imaging of the chest using the
standard protocol during bolus administration of intravenous
contrast. Multiplanar reconstructed images including MIPs were
obtained and reviewed to evaluate the vascular anatomy.
Contrast: 100mL OMNIPAQUE IOHEXOL 350 MG/ML IV SOLN

[Series 4: pe 1.25 · axial · 0.72mm/px · z∈[-244,+25]mm · 14 of 255 slices shown]
[im 20/255  lung]
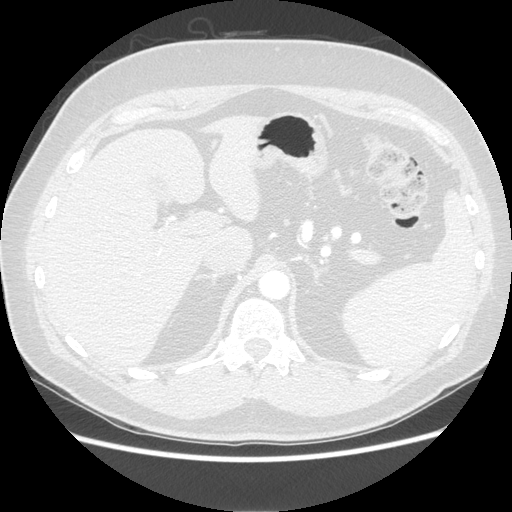
[im 40/255  mediastinal]
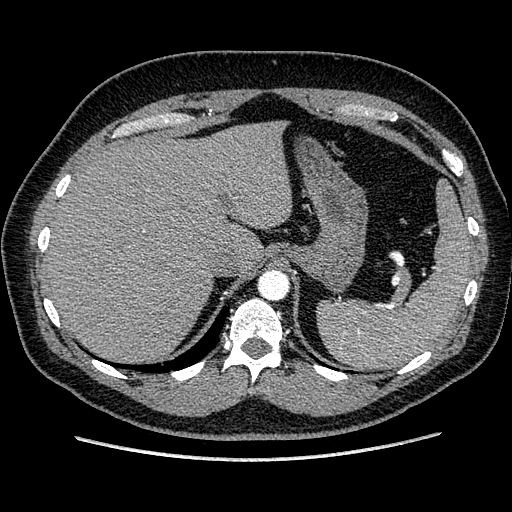
[im 59/255  lung]
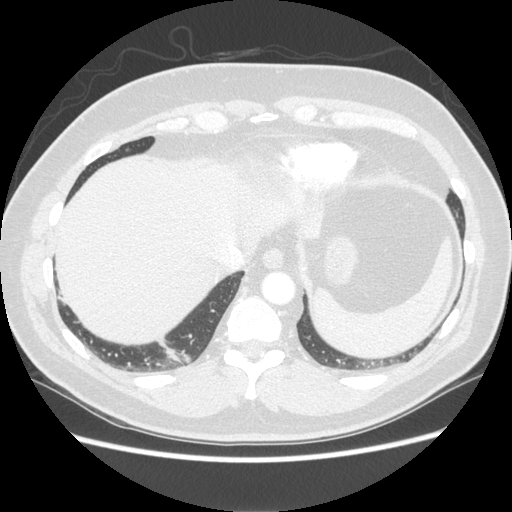
[im 79/255  mediastinal]
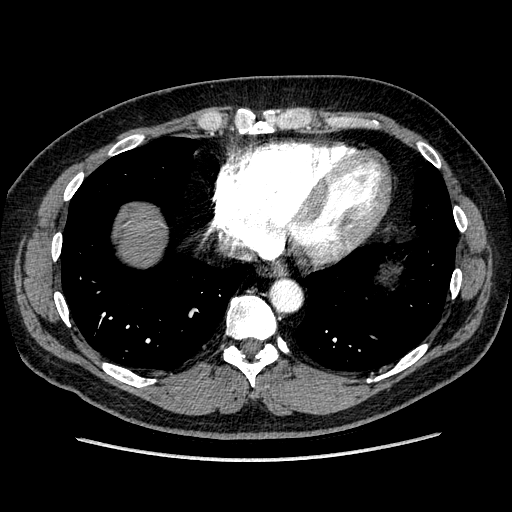
[im 98/255  lung]
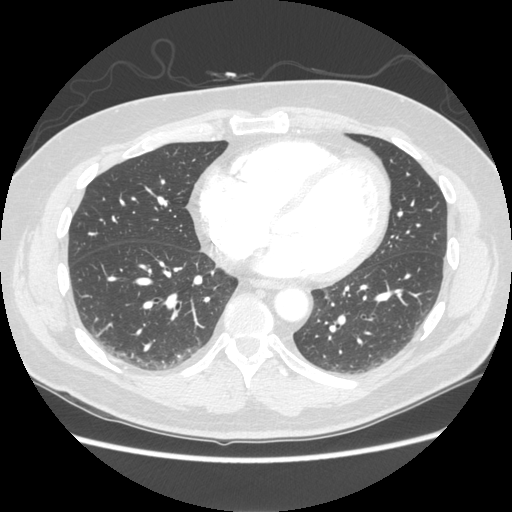
[im 118/255  mediastinal]
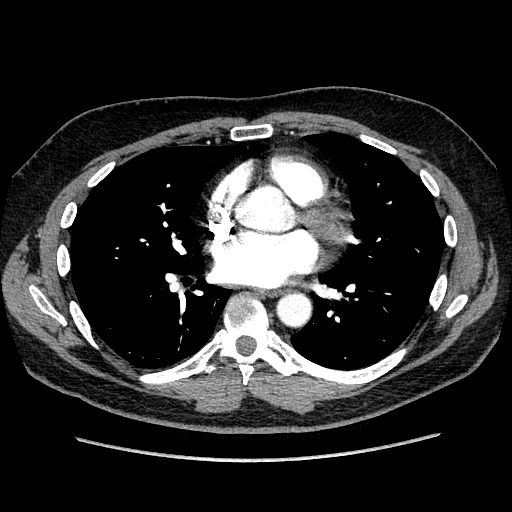
[im 120/255  lung]
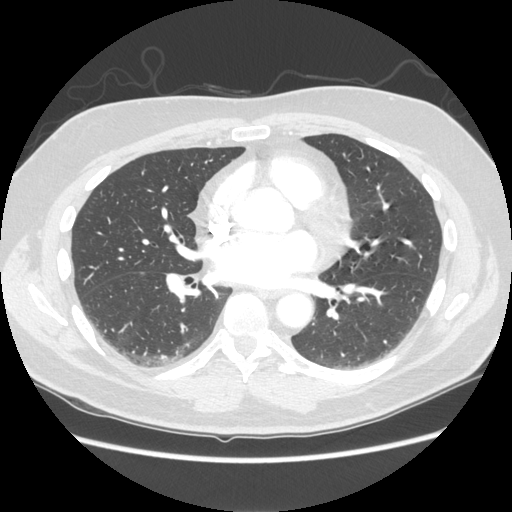
[im 128/255  mediastinal]
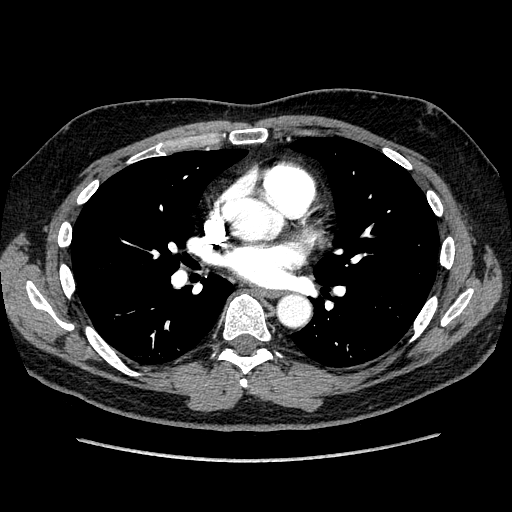
[im 137/255  lung]
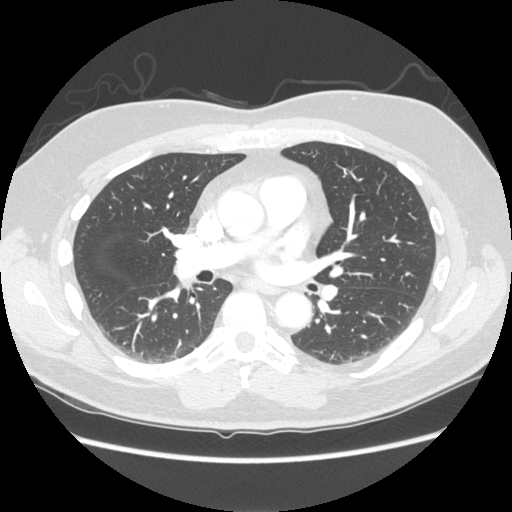
[im 157/255  mediastinal]
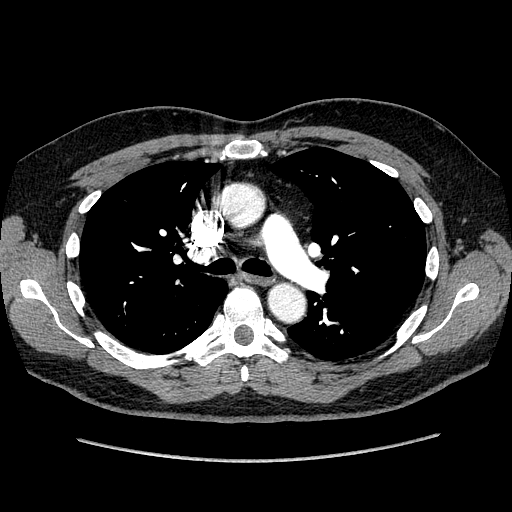
[im 176/255  lung]
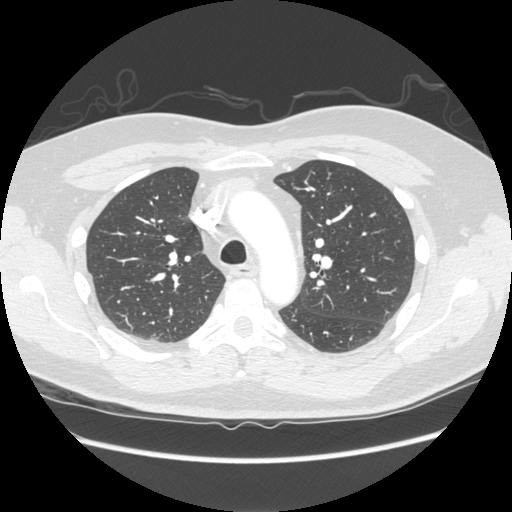
[im 196/255  mediastinal]
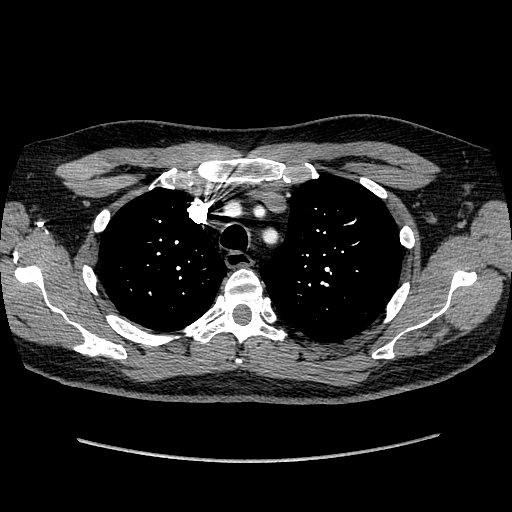
[im 215/255  lung]
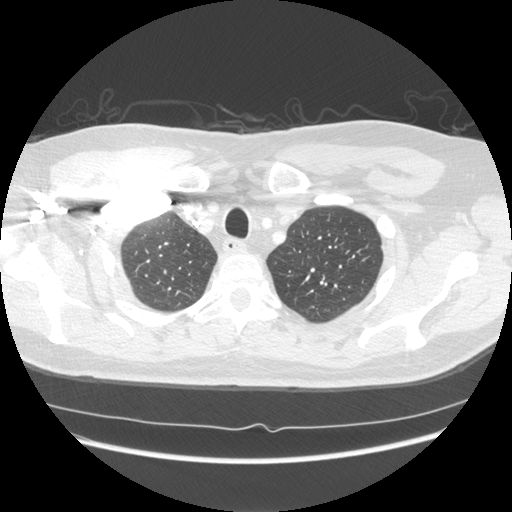
[im 235/255  mediastinal]
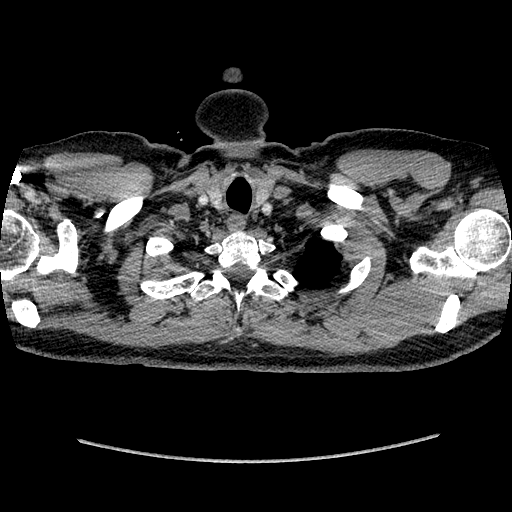

[Series 6: lung windows · axial · 0.72mm/px · z∈[-193,-48]mm · 5 of 117 slices shown]
[im 20/117  mediastinal]
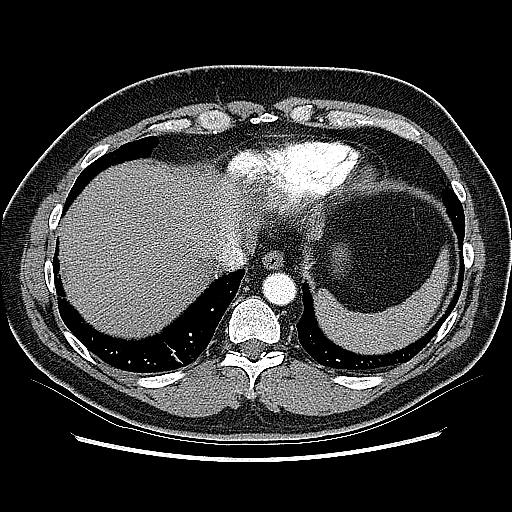
[im 39/117  mediastinal]
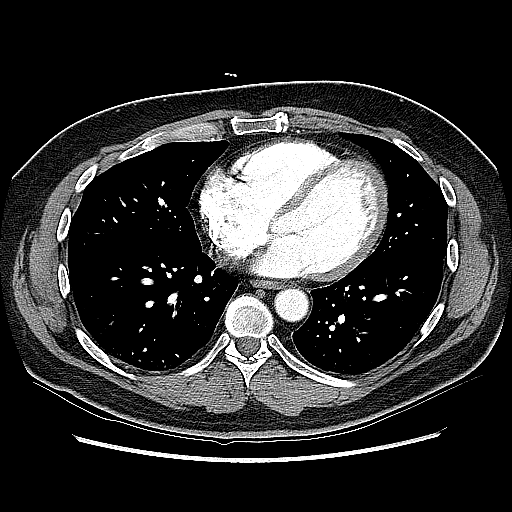
[im 50/117  mediastinal]
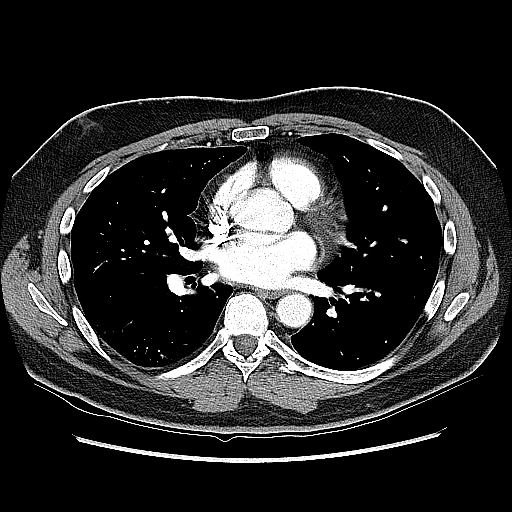
[im 59/117  mediastinal]
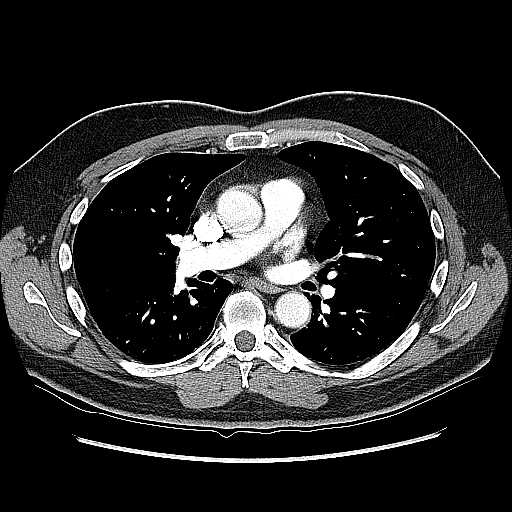
[im 78/117  mediastinal]
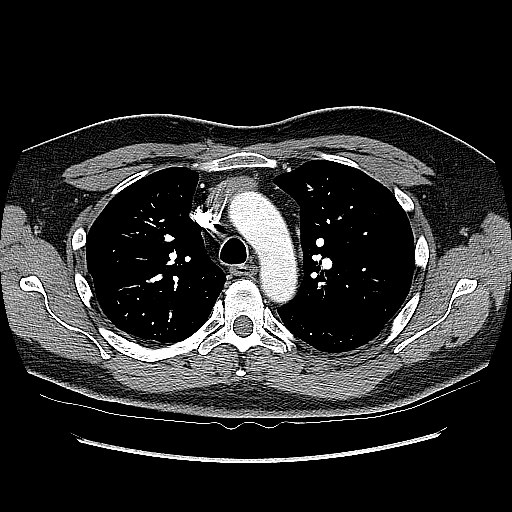

[19 of 32 positions shown; findings below may reference images not displayed]

FINDINGS: Very small amount of residual thrombus within two
subsegmental branches of the left lower lobe pulmonary artery
(series 4/images 120 and 131).  Overall clot burden is minimal.

Mild dependent atelectasis in the bilateral lower lobe.  Scarring
versus atelectasis at the right lung base.  No suspicious pulmonary
nodules. No pleural effusion or pneumothorax.

Visualized thyroid is unremarkable.

The heart is normal in size.  No pericardial effusion.

No suspicious mediastinal, hilar, or axillary lymphadenopathy.

Visualized upper abdomen is unremarkable.

Very mild degenerative changes of the thoracic spine.
IMPRESSION: Very small amount of residual thrombus within two subsegmental
branches of the left lower lobe pulmonary artery.  Overall clot
burden is minimal.

These results will be called to the ordering clinician or
representative by the Radiologist Assistant, and communication
documented in the PACS Dashboard.

## 2012-04-26 ENCOUNTER — Ambulatory Visit (HOSPITAL_BASED_OUTPATIENT_CLINIC_OR_DEPARTMENT_OTHER): Payer: BC Managed Care – PPO | Admitting: Hematology & Oncology

## 2012-04-26 ENCOUNTER — Other Ambulatory Visit (HOSPITAL_BASED_OUTPATIENT_CLINIC_OR_DEPARTMENT_OTHER): Payer: BC Managed Care – PPO | Admitting: Lab

## 2012-04-26 VITALS — BP 103/58 | HR 73 | Temp 97.6°F | Resp 18 | Ht 72.0 in | Wt 217.0 lb

## 2012-04-26 DIAGNOSIS — I2699 Other pulmonary embolism without acute cor pulmonale: Secondary | ICD-10-CM

## 2012-04-26 NOTE — Progress Notes (Signed)
This office note has been dictated.

## 2012-04-26 NOTE — Progress Notes (Signed)
CC:   Marguarite Arbour, MD  DIAGNOSIS:  Idiopathic pulmonary embolism.  CURRENT THERAPY:  Xarelto 20 mg p.o. daily (the patient will complete 2 years of therapy in October 2014).  INTERIM HISTORY:  Glenn Wheeler comes in for followup.  We last saw him back in October.  He has been doing okay.  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  He is getting ready for Easter Sunday.  He is doing well with the Xarelto.  He does have some occasional bleeding out of the left naris.  He has some stuffiness in this nostril. He has been seeing ENT for this.  Of note, he is now on AndroGel.  This makes him feel a whole lot better. I think this would be okay for him in that he has had the pulmonary embolism.  He is on Xarelto right now.  I told him that if he does develop another thromboembolic event, that he will need to be off any kind of testosterone replacement therapy.  He has had no nausea or vomiting.  He has had no leg swelling.  He has had no rashes.  His cutaneous leishmaniasis has not been a problem.  PHYSICAL EXAMINATION:  General:  This is a well-developed, well- nourished white gentleman in no obvious distress.  Vital signs:  Show a temperature of 97.6, pulse 73, respiratory rate 18, blood pressure 103/58.  Weight is 217.  Head and neck:  Shows a normocephalic, atraumatic skull.  There are no ocular or oral lesions.  There are no palpable cervical or supraclavicular lymph nodes.  Lungs:  Clear bilaterally.  Cardiac:  Regular rate and rhythm with a normal S1 and S2. There are no murmurs, rubs or bruits.  Abdomen:  Soft with good bowel sounds.  There is no palpable abdominal mass.  There is no fluid wave. There is no palpable hepatosplenomegaly.  Extremities:  Show no clubbing, cyanosis or edema.  Neurological:  Shows no focal neurological deficits.  IMPRESSION:  Glenn Wheeler is a 57 year old gentleman with an idiopathic pulmonary embolism.  He presented back in October 2012.  Again, he  will receive 2 years of therapy.  We will go ahead and get him back in March.  I do not see a need for any additional scans.    ______________________________ Glenn Wheeler, M.D. PRE/MEDQ  D:  04/26/2012  T:  04/26/2012  Job:  [id number]

## 2012-05-14 ENCOUNTER — Telehealth: Payer: Self-pay | Admitting: Internal Medicine

## 2012-05-14 MED ORDER — ATORVASTATIN CALCIUM 20 MG PO TABS: 20.0000 mg | ORAL_TABLET | Freq: Every day | ORAL | Status: DC

## 2012-05-14 NOTE — Telephone Encounter (Signed)
Refill- atorvastatin 20mg  tablets. Take one tablet by mouth every day. Qty 30 last fill 3.18.14

## 2012-05-20 ENCOUNTER — Other Ambulatory Visit: Payer: Self-pay | Admitting: *Deleted

## 2012-05-20 DIAGNOSIS — I2699 Other pulmonary embolism without acute cor pulmonale: Secondary | ICD-10-CM

## 2012-05-20 MED ORDER — RIVAROXABAN 20 MG PO TABS: 20.0000 mg | ORAL_TABLET | Freq: Every day | ORAL | Status: DC

## 2012-05-20 NOTE — Telephone Encounter (Signed)
Pt called requesting a Xarelto refill. He stated [facility] faxed it on Thursday. Have not received a fax from [facility]. Sent via e-rx as this is a chronic med for the pt.

## 2012-08-14 ENCOUNTER — Telehealth: Payer: Self-pay | Admitting: *Deleted

## 2012-08-14 NOTE — Telephone Encounter (Signed)
Received fax from [facility] requesting refill of atorvastatin 20mg , #90. Form forwarded to Provider for signature.

## 2012-08-15 MED ORDER — ATORVASTATIN CALCIUM 20 MG PO TABS: 20.0000 mg | ORAL_TABLET | Freq: Every day | ORAL | Status: DC

## 2012-08-15 NOTE — Telephone Encounter (Signed)
Form faxed to [facility] at [phone].

## 2012-10-18 ENCOUNTER — Telehealth: Payer: Self-pay | Admitting: Hematology & Oncology

## 2012-10-18 ENCOUNTER — Ambulatory Visit (INDEPENDENT_AMBULATORY_CARE_PROVIDER_SITE_OTHER): Payer: BC Managed Care – PPO | Admitting: Family

## 2012-10-18 ENCOUNTER — Encounter: Payer: Self-pay | Admitting: Family

## 2012-10-18 VITALS — BP 100/78 | HR 65 | Temp 97.6°F | Resp 18 | Ht 72.0 in | Wt 204.0 lb

## 2012-10-18 DIAGNOSIS — R040 Epistaxis: Secondary | ICD-10-CM | POA: Insufficient documentation

## 2012-10-18 DIAGNOSIS — Z23 Encounter for immunization: Secondary | ICD-10-CM

## 2012-10-18 DIAGNOSIS — M791 Myalgia, unspecified site: Secondary | ICD-10-CM

## 2012-10-18 DIAGNOSIS — E785 Hyperlipidemia, unspecified: Secondary | ICD-10-CM

## 2012-10-18 DIAGNOSIS — IMO0001 Reserved for inherently not codable concepts without codable children: Secondary | ICD-10-CM

## 2012-10-18 DIAGNOSIS — E291 Testicular hypofunction: Secondary | ICD-10-CM

## 2012-10-18 LAB — HEPATIC FUNCTION PANEL
ALT: 22 U/L (ref 0–53)
AST: 18 U/L (ref 0–37)
Bilirubin, Direct: 0.1 mg/dL (ref 0.0–0.3)
Indirect Bilirubin: 0.6 mg/dL (ref 0.0–0.9)

## 2012-10-18 LAB — LIPID PANEL: Cholesterol: 206 mg/dL — ABNORMAL HIGH (ref 0–200)

## 2012-10-18 NOTE — Assessment & Plan Note (Signed)
No clear if this is statin related. Will check CK level, have pt stop lipitor for 2 weeks then call me to let me know.  Could consider pravastatin if symptoms resolve off of lipitor.

## 2012-10-18 NOTE — Assessment & Plan Note (Signed)
Hold lipitor due to myalgia, check FLP/LFT.

## 2012-10-18 NOTE — Assessment & Plan Note (Signed)
Clinically stable off of testosterone.  Monitor.

## 2012-10-18 NOTE — Assessment & Plan Note (Signed)
Mild, he is due to come off of xarelto in about a month. Monitor.

## 2012-10-18 NOTE — Telephone Encounter (Signed)
Patient walked into office and cx 10/25/12 apt and resch for 12/02/12

## 2012-10-18 NOTE — Patient Instructions (Addendum)
Please complete lab work prior to leaving.  Hold lipitor for 2 weeks and let me know if how you feel. Follow up in 3 months.

## 2012-10-18 NOTE — Progress Notes (Signed)
Subjective:    Patient ID: Glenn Wheeler, male    DOB: [birth year], 57 y.o.   MRN: [id number]  HPI  Glenn Wheeler is a 57 yr old male who presents today to discuss several medical issues:  1) Hyperlipidemia-  Maintained on lipitor.  Las LDL was 128 in March.  2) Hypogonadism- had been taking testosterone but stopped 2 months ago.  He reports that he was concerned about risks.  Feels ok since stopping.   3) Epistaxis- notes constant blood in nasal secretions.  Wonders if this is due to xarelto.  Uses a breath right strip.  Has crusting in the AM.  He uses nasal saline spray.  Also uses some triple antibiotic ointment.    4) Leg Pain- Pt notes worsening bilateral leg pain x 2-3 months. Reports that pain is sometimes in the calf and sometims beneath the knees.  He cut his statin does in half.  Notes that this helped a lot.  Was every day, now once a week.      Review of Systems    see HPI  Past Medical History  Diagnosis Date  . History of chicken pox     childhood  . Hyperlipidemia   . Colon polyps     2001, 2007  . Pulmonary embolus   . Leishmaniasis 05-2011    History   Social History  . Marital Status: Married    Spouse Name: N/A    Number of Children: N/A  . Years of Education: N/A   Occupational History  . Not on file.   Social History Main Topics  . Smoking status: Former Games developer  . Smokeless tobacco: Never Used     Comment: quit 1984 1/2 ppd for 4 years  . Alcohol Use: No  . Drug Use: No  . Sexual Activity: Not on file   Other Topics Concern  . Not on file   Social History Narrative  . No narrative on file    Past Surgical History  Procedure Laterality Date  . Tendon repair  2005    right peroneal tendon-[facility]  . Rotator cuff repair  2007    [facility]  . Cataract extraction, bilateral  2009    [facility]    Family History  Problem Relation Age of Onset  . Prostate cancer Father     alive  . Hyperlipidemia Mother   . Hypertension Mother   . Diabetes Mother   . Stroke Paternal Grandfather   . Heart attack Neg Hx   . Sudden death Neg Hx   . Clotting disorder Father     blood clots  . Stroke Maternal Grandfather     No Known Allergies  Current Outpatient Prescriptions on File Prior to Visit  Medication Sig Dispense Refill  . acetaminophen (TYLENOL) 100 MG/ML solution Take 10 mg/kg by mouth every 4 (four) hours as needed.      Marland Kitchen atorvastatin (LIPITOR) 20 MG tablet Take 1 tablet (20 mg total) by mouth daily.  90 tablet  1  . Rivaroxaban (XARELTO) 20 MG TABS Take 1 tablet (20 mg total) by mouth daily.  30 tablet  6  . [DISCONTINUED] fluticasone (FLONASE) 50 MCG/ACT nasal spray Place 1 spray into the nose daily.  16 g  3   No current facility-administered medications on file prior to visit.    BP 100/78  Pulse 65  Temp(Src) 97.6 F (36.4 C) (Oral)  Resp 18  Ht 6' (1.829 m)  Wt 204 lb (92.534 kg)  BMI 27.66 kg/m2  SpO2 99%    Objective:   Physical Exam  Constitutional: He is oriented to person, place, and time. He appears well-developed and well-nourished. No distress.  HENT:  Head: Normocephalic and atraumatic.  + bloody crusted material in the left nare, no ulcers noted in nares.  Cardiovascular: Normal rate and regular rhythm.   No murmur heard. Pulmonary/Chest: Effort normal and breath sounds normal. No respiratory distress. He has no wheezes. He has no rales. He exhibits no tenderness.  Musculoskeletal: He exhibits no edema.  Neurological: He is alert and oriented to person, place, and time.  Psychiatric: He has a normal mood and affect. His behavior is normal. Judgment and thought content normal.          Assessment & Plan:

## 2012-10-20 ENCOUNTER — Encounter: Payer: Self-pay | Admitting: Family

## 2012-10-25 ENCOUNTER — Ambulatory Visit: Payer: Self-pay | Admitting: Hematology & Oncology

## 2012-10-25 ENCOUNTER — Other Ambulatory Visit: Payer: Self-pay | Admitting: Lab

## 2012-11-05 ENCOUNTER — Telehealth: Payer: Self-pay | Admitting: *Deleted

## 2012-11-05 NOTE — Telephone Encounter (Signed)
Pt called stating that leg pain seems to have gotten a little better since stopping lipitor x 2 weeks. Still has some leg pain.  Please advise what pt should do going forward?

## 2012-11-06 MED ORDER — PRAVASTATIN SODIUM 40 MG PO TABS: 40.0000 mg | ORAL_TABLET | Freq: Every day | ORAL | Status: DC

## 2012-11-06 NOTE — Telephone Encounter (Signed)
Remain off of lipitor. Start pravastatin. Call if worsening leg pain after starting pravastatin. Come fasting to his follow up in December and we will recheck lipids.

## 2012-11-06 NOTE — Telephone Encounter (Signed)
Notified pt and he voices understanding. 

## 2012-12-02 ENCOUNTER — Other Ambulatory Visit (HOSPITAL_BASED_OUTPATIENT_CLINIC_OR_DEPARTMENT_OTHER): Payer: BC Managed Care – PPO | Admitting: Lab

## 2012-12-02 ENCOUNTER — Ambulatory Visit (HOSPITAL_BASED_OUTPATIENT_CLINIC_OR_DEPARTMENT_OTHER): Payer: BC Managed Care – PPO | Admitting: Hematology & Oncology

## 2012-12-02 VITALS — BP 110/65 | HR 68 | Temp 97.7°F | Resp 18 | Ht 72.0 in | Wt 209.0 lb

## 2012-12-02 DIAGNOSIS — I2699 Other pulmonary embolism without acute cor pulmonale: Secondary | ICD-10-CM

## 2012-12-02 LAB — CBC WITH DIFFERENTIAL (CANCER CENTER ONLY)
BASO#: 0 10*3/uL (ref 0.0–0.2)
BASO%: 0.5 % (ref 0.0–2.0)
HCT: 42.9 % (ref 38.7–49.9)
HGB: 14.5 g/dL (ref 13.0–17.1)
LYMPH%: 31.2 % (ref 14.0–48.0)
MCV: 88 fL (ref 82–98)
MONO#: 0.4 10*3/uL (ref 0.1–0.9)
NEUT%: 55.9 % (ref 40.0–80.0)
RDW: 14 % (ref 11.1–15.7)
WBC: 3.8 10*3/uL — ABNORMAL LOW (ref 4.0–10.0)

## 2012-12-02 NOTE — Progress Notes (Signed)
This office note has been dictated.

## 2012-12-03 NOTE — Progress Notes (Signed)
DIAGNOSIS:  Idiopathic pulmonary embolism.  CURRENT THERAPY: 1. Patient to complete his 2 years of Xarelto this month. 2. Patient to start aspirin 162 mg p.o. daily after the Xarelto.  INTERIM HISTORY:  Glenn Wheeler comes in for his followup.  He is doing quite well.  We last saw him back in March.  He has had no issues with respect to the Xarelto.  He is apparently still having some sinus issues. Hopefully, this will improve maybe once he gets off the Xarelto.  He has had no bleeding.  He has had no bruising.  He has some occasional leg swelling and pain.  We did Dopplers of his legs back in March of last year, and everything turned out okay.  He is not taking any AndroGel now.  He had his statin medicine switched around.  He was having a lot of arthralgias and myalgias.  This is feeling a little bit better now.  He has not had any problems with his cutaneous leishmaniasis.  PHYSICAL EXAMINATION:  General:  This is a well-developed, well- nourished white gentleman, in no obvious distress.  Vital Signs: Temperature of 97.7, pulse 68, respiratory rate 18, blood pressure 110/65.  Weight is 209.  Head and Neck:  Normocephalic, atraumatic skull.  There are no ocular or oral lesions.  There are no palpable cervical or supraclavicular lymph nodes.  Lungs:  Clear bilaterally. Cardiac:  Regular rate and rhythm with normal S1, S2.  There are no murmurs, rubs, or bruits.  Abdomen:  Soft.  He has good bowel sounds. There is no palpable abdominal mass.  There is no fluid wave.  There is no palpable hepatosplenomegaly.  Back:  No tenderness over the spine, ribs, or hips.  Extremities:  Some mild nonpitting edema of his lower legs.  Right leg may be slightly more swollen than left leg.  No venous cord is noted.  He has good range motion of his joints.  Skin:  No rash, ecchymosis, or petechia.  Neurological:  No focal neurological deficits.  LABORATORY STUDIES:  White cell count is 3.8,  hemoglobin is 14.5, hematocrit 42.9, platelet count 117.  MCV is 88.  I looked at his blood smear.  I did not see anything that was unusual.  He has had no nucleated red blood cells.  I saw no immature myeloid cells.  There is no rouleaux formation.  Platelets were mildly decreased in number. Platelets looked adequate in size.  Platelets have good granulation.  IMPRESSION:  Glenn Wheeler is a nice 57 year old gentleman with idiopathic pulmonary embolism.  He had an extensive hypercoagulable panel.  Nothing ever showed up as positive.  Again, we will get him off the Xarelto now.  We will get him back onto aspirin at 162 mg.  I want to see him back in about 3 months after he starts the aspirin.  I think if all is okay once he comes back to see [place] on the aspirin, then we can probably let him go from the clinic.  Glenn Wheeler has done incredibly well.  I reviewed all this with Glenn Wheeler.  I gave him our recommendation for the aspirin.  He agreed.    ______________________________ [name], M.D. PRE/MEDQ  D:  12/02/2012  T:  12/03/2012  Job:  [id number]

## 2012-12-25 ENCOUNTER — Encounter: Payer: Self-pay | Admitting: Physician Assistant

## 2012-12-25 ENCOUNTER — Ambulatory Visit (INDEPENDENT_AMBULATORY_CARE_PROVIDER_SITE_OTHER): Payer: BC Managed Care – PPO | Admitting: Physician Assistant

## 2012-12-25 VITALS — BP 122/70 | HR 79 | Temp 98.1°F | Resp 16 | Ht 72.0 in | Wt 210.0 lb

## 2012-12-25 DIAGNOSIS — J209 Acute bronchitis, unspecified: Secondary | ICD-10-CM

## 2012-12-25 MED ORDER — AZITHROMYCIN 250 MG PO TABS: ORAL_TABLET | ORAL | Status: DC

## 2012-12-25 NOTE — Patient Instructions (Signed)
Please increase fluid intake.  Take a daily probiotic (Align, Digestive Advantage).  Humidifier in bedroom.  Tylenol and chloraseptic for sore throat.  Please call or return to clinic if symptoms do not begin to improve after antibiotic therapy.  Bronchitis Bronchitis is the body's way of reacting to injury and/or infection (inflammation) of the bronchi. Bronchi are the air tubes that extend from the windpipe into the lungs. If the inflammation becomes severe, it may cause shortness of breath. CAUSES  Inflammation may be caused by:  A virus.  Germs (bacteria).  Dust.  Allergens.  Pollutants and many other irritants. The cells lining the bronchial tree are covered with tiny hairs (cilia). These constantly beat upward, away from the lungs, toward the mouth. This keeps the lungs free of pollutants. When these cells become too irritated and are unable to do their job, mucus begins to develop. This causes the characteristic cough of bronchitis. The cough clears the lungs when the cilia are unable to do their job. Without either of these protective mechanisms, the mucus would settle in the lungs. Then you would develop pneumonia. Smoking is a common cause of bronchitis and can contribute to pneumonia. Stopping this habit is the single most important thing you can do to help yourself. TREATMENT   Your caregiver may prescribe an antibiotic if the cough is caused by bacteria. Also, medicines that open up your airways make it easier to breathe. Your caregiver may also recommend or prescribe an expectorant. It will loosen the mucus to be coughed up. Only take over-the-counter or prescription medicines for pain, discomfort, or fever as directed by your caregiver.  Removing whatever causes the problem (smoking, for example) is critical to preventing the problem from getting worse.  Cough suppressants may be prescribed for relief of cough symptoms.  Inhaled medicines may be prescribed to help with  symptoms now and to help prevent problems from returning.  For those with recurrent (chronic) bronchitis, there may be a need for steroid medicines. SEEK IMMEDIATE MEDICAL CARE IF:   During treatment, you develop more pus-like mucus (purulent sputum).  You have a fever.  You become progressively more ill.  You have increased difficulty breathing, wheezing, or shortness of breath. It is necessary to seek immediate medical care if you are elderly or sick from any other disease. MAKE SURE YOU:   Understand these instructions.  Will watch your condition.  Will get help right away if you are not doing well or get worse. Document Released: 01/16/2005 Document Revised: 09/18/2012 Document Reviewed: 09/10/2012 [facility] Patient Information 2014 [place], [place].

## 2012-12-25 NOTE — Assessment & Plan Note (Signed)
Rx Azithromycin.  Increase fluid intake.  Rest.  Probiotic.  OTC claritin.  Delsym for cough.  Humidifier in bedroom.  Probiotic.

## 2012-12-25 NOTE — Progress Notes (Signed)
Pre visit review using our clinic review tool, if applicable. No additional management support is needed unless otherwise documented below in the visit note. 

## 2012-12-25 NOTE — Progress Notes (Signed)
Patient ID: [name], male   DOB: 01/07/1956, 57 y.o.   MRN: [id number]  Patient presents to clinic today c/o nasal congestion, chest congestion, chills and cough productive of clear-green sputum.  Patient also endorses occasional scratchy throat.  Throat has improved but patient feels that other symptoms are worsening.  Is beginning to note fatigue.  Patient denies fever, myalgias.  Denies recent sick contact or travel.     Past Medical History  Diagnosis Date  . History of chicken pox     childhood  . Hyperlipidemia   . Colon polyps     2001, 2007  . Pulmonary embolus   . Leishmaniasis 05-2011    Current Outpatient Prescriptions on File Prior to Visit  Medication Sig Dispense Refill  . pravastatin (PRAVACHOL) 40 MG tablet Take 1 tablet (40 mg total) by mouth daily.  30 tablet  2  . [DISCONTINUED] fluticasone (FLONASE) 50 MCG/ACT nasal spray Place 1 spray into the nose daily.  16 g  3   No current facility-administered medications on file prior to visit.    No Known Allergies  Family History  Problem Relation Age of Onset  . Prostate cancer Father     alive  . Hyperlipidemia Mother   . Hypertension Mother   . Diabetes Mother   . Stroke Paternal Grandfather   . Heart attack Neg Hx   . Sudden death Neg Hx   . Clotting disorder Father     blood clots  . Stroke Maternal Grandfather     History   Social History  . Marital Status: Married    Spouse Name: N/A    Number of Children: N/A  . Years of Education: N/A   Social History Main Topics  . Smoking status: Former Games developer  . Smokeless tobacco: Never Used     Comment: quit 1984 1/2 ppd for 4 years  . Alcohol Use: No  . Drug Use: No  . Sexual Activity: None   Other Topics Concern  . None   Social History Narrative  . None   ROS See HPI.  All other ROS are negative.  Filed Vitals:   12/25/12 1303  BP: 122/70  Pulse: 79  Temp: 98.1 F (36.7 C)  Resp: 16    Physical Exam  Constitutional: He is  oriented to person, place, and time and well-developed, well-nourished, and in no distress.  HENT:  Head: Normocephalic and atraumatic.  Right Ear: External ear normal.  Left Ear: External ear normal.  Nose: Nose normal.  Mouth/Throat: Oropharynx is clear and moist.  TM within normal limits bilaterally.  No tenderness to percussion of sinuses.  Eyes: Conjunctivae are normal.  Neck: Neck supple.  Cardiovascular: Normal rate, regular rhythm and normal heart sounds.   Pulmonary/Chest: Effort normal and breath sounds normal. No respiratory distress. He has no wheezes. He has no rales. He exhibits no tenderness.  Lymphadenopathy:    He has no cervical adenopathy.  Neurological: He is alert and oriented to person, place, and time.  Skin: Skin is warm and dry. No rash noted.  Psychiatric: Affect normal.     Recent Results (from the past 2160 hour(s))  LIPID PANEL     Status: Abnormal   Collection Time    10/18/12  9:11 AM      Result Value Range   Cholesterol 206 (*) 0 - 200 mg/dL   Comment: ATP III Classification:           < 200  mg/dL        Desirable          200 - 239     mg/dL        Borderline High          >= 240        mg/dL        High         Triglycerides 102  <150 mg/dL   HDL 43  >47 mg/dL   Total CHOL/HDL Ratio 4.8     VLDL 20  0 - 40 mg/dL   LDL Cholesterol 829 (*) 0 - 99 mg/dL   Comment:       Total Cholesterol/HDL Ratio:CHD Risk                            Coronary Heart Disease Risk Table                                            Men       Women              1/2 Average Risk              3.4        3.3                  Average Risk              5.0        4.4               2X Average Risk              9.6        7.1               3X Average Risk             23.4       11.0     Use the calculated Patient Ratio above and the CHD Risk table      to determine the patient's CHD Risk.     ATP III Classification (LDL):           < 100        mg/dL          Optimal          100 - 129     mg/dL         Near or Above Optimal          130 - 159     mg/dL         Borderline High          160 - 189     mg/dL         High           > 190        mg/dL         Very High        HEPATIC FUNCTION PANEL     Status: None   Collection Time    10/18/12  9:11 AM      Result Value Range   Total Bilirubin 0.7  0.3 - 1.2 mg/dL   Bilirubin, Direct 0.1  0.0 - 0.3 mg/dL   Indirect Bilirubin 0.6  0.0 - 0.9  mg/dL   Alkaline Phosphatase 63  39 - 117 U/L   AST 18  0 - 37 U/L   ALT 22  0 - 53 U/L   Total Protein 6.9  6.0 - 8.3 g/dL   Albumin 4.5  3.5 - 5.2 g/dL  CK     Status: None   Collection Time    10/18/12  9:11 AM      Result Value Range   Total CK 130  7 - 232 U/L  CBC WITH DIFFERENTIAL ([facility])     Status: Abnormal   Collection Time    12/02/12  8:29 AM      Result Value Range   WBC 3.8 (*) 4.0 - 10.0 10e3/uL   RBC 4.87  4.20 - 5.70 10e6/uL   HGB 14.5  13.0 - 17.1 g/dL   HCT 56.2  13.0 - 86.5 %   MCV 88  82 - 98 fL   MCH 29.8  28.0 - 33.4 pg   MCHC 33.8  32.0 - 35.9 g/dL   RDW 78.4  69.6 - 29.5 %   Platelets 117 (*) 145 - 400 10e3/uL   NEUT# 2.1  1.5 - 6.5 10e3/uL   LYMPH# 1.2  0.9 - 3.3 10e3/uL   MONO# 0.4  0.1 - 0.9 10e3/uL   Eosinophils Absolute 0.1  0.0 - 0.5 10e3/uL   BASO# 0.0  0.0 - 0.2 10e3/uL   NEUT% 55.9  40.0 - 80.0 %   LYMPH% 31.2  14.0 - 48.0 %   MONO% 10.8  0.0 - 13.0 %   EOS% 1.6  0.0 - 7.0 %   BASO% 0.5  0.0 - 2.0 %  D-DIMER, QUANTITATIVE     Status: None   Collection Time    12/02/12  8:29 AM      Result Value Range   D-Dimer, Quant 0.30  0.00 - 0.48 ug/mL-FEU   Comment: At the inhouse established cutoff value of 0.48 ug/mL FEU, thismethology has been documented in the literature to have a sensitivityand negative predictive value of at least 98-99%.  The test resultshould be correlated with an assessment of the clinical      probability ofDVT/VTE.   Assessment/Plan: Acute bronchitis Rx Azithromycin.   Increase fluid intake.  Rest.  Probiotic.  OTC claritin.  Delsym for cough.  Humidifier in bedroom.  Probiotic.

## 2012-12-31 ENCOUNTER — Encounter: Payer: Self-pay | Admitting: Internal Medicine

## 2013-01-05 NOTE — Progress Notes (Signed)
DIAGNOSIS:  Idiopathic pulmonary embolism.  CURRENT THERAPY: 1. The patient to complete his 2 years of Xarelto this month. 2. The patient to start aspirin 162 mg p.o. daily after the Xarelto.  INTERIM HISTORY:  [name] comes in for his followup.  He is doing quite well.  We last saw him back in March.  He has had no issues with respect to the Xarelto.  He is still having some sinus issues.  Hopefully, this will improve, maybe, once he gets off the Xarelto.  He has had no bleeding.  He has had no bruising.  He has some occasional leg swelling and pain.  We did Dopplers of his legs back in March of last year, and everything turned out okay.  He is not taking any AndroGel now.  He had his statin medicine switched around.  He was having a lot of arthralgias and myalgias.  This is feeling a little bit better now.  He has not had any problems with his cutaneous leishmaniasis.  PHYSICAL EXAMINATION:  General:  This is a well-developed, well- nourished white gentleman, in no obvious distress.  Vital Signs: Temperature of 97.7, pulse 68, respiratory rate 18, blood pressure 110/65.  Weight is 209.  Head and Neck:  Normocephalic, atraumatic skull.  There are no ocular or oral lesions.  There are no palpable cervical or supraclavicular lymph nodes.  Lungs:  Clear bilaterally. Cardiac:  Regular rate and rhythm with a normal S1, S2.  There are no murmurs, rubs, or bruits.  Abdomen:  Soft.  He has good bowel sounds. There was no palpable abdominal mass.  There was no fluid wave.  There is no palpable hepatosplenomegaly.  Back:  No tenderness over the spine, ribs, or hips.  Extremities:  Some mild nonpitting edema of his lower legs.  Right leg may be slightly more swollen than the left leg.  No venous cord is noted.  He has good range motion of his joints.  Skin: No rashes, ecchymosis, or petechiae.  Neurologic:  No focal neurological deficits.  LABORATORY STUDIES:  White cell count is  3.8, hemoglobin is 14.5, hematocrit 43.9, platelet count 117.  MCV is 88.  I looked at his blood smear.  I do not see anything that was unusual. He had no nucleated red blood cells.  I saw no immature myeloid cells. There is no rouleaux formation.  Platelets were mildly decreased in number.  Platelets looked adequate in size.  Platelets have good granulation.  IMPRESSION:  Glenn Wheeler is a nice 57 year old gentleman with idiopathic pulmonary embolism.  He had an extensive hypercoagulable panel.  Nothing ever showed up as positive.  Again, we will get him off the Xarelto now.  We will get him back on to aspirin at 162 mg.  I want to see him back about 3 months after he starts the aspirin.  I think if all is okay, once he comes back to see [place] on the aspirin, then we can probably let him go from the clinic.  Glenn Wheeler has done incredibly well.  I reviewed all this with Glenn Wheeler.  I gave him my recommendations for the aspirin.  He agreed.    ______________________________ [name], M.D. PRE/MEDQ  D:  12/02/2012  T:  01/04/2013  Job:  [id number]

## 2013-01-17 ENCOUNTER — Ambulatory Visit: Payer: Self-pay | Admitting: Family

## 2013-01-28 ENCOUNTER — Other Ambulatory Visit: Payer: Self-pay | Admitting: Family

## 2013-02-04 ENCOUNTER — Other Ambulatory Visit: Payer: BC Managed Care – PPO

## 2013-02-07 ENCOUNTER — Ambulatory Visit (INDEPENDENT_AMBULATORY_CARE_PROVIDER_SITE_OTHER): Payer: BC Managed Care – PPO | Admitting: Family

## 2013-02-07 ENCOUNTER — Encounter: Payer: Self-pay | Admitting: Family

## 2013-02-07 VITALS — BP 106/80 | HR 76 | Temp 97.5°F | Resp 16 | Ht 72.0 in | Wt 212.0 lb

## 2013-02-07 DIAGNOSIS — E291 Testicular hypofunction: Secondary | ICD-10-CM

## 2013-02-07 DIAGNOSIS — I2699 Other pulmonary embolism without acute cor pulmonale: Secondary | ICD-10-CM

## 2013-02-07 DIAGNOSIS — E785 Hyperlipidemia, unspecified: Secondary | ICD-10-CM

## 2013-02-07 LAB — HEPATIC FUNCTION PANEL
ALK PHOS: 66 U/L (ref 39–117)
ALT: 30 U/L (ref 0–53)
AST: 25 U/L (ref 0–37)
Albumin: 4 g/dL (ref 3.5–5.2)
Bilirubin, Direct: 0.1 mg/dL (ref 0.0–0.3)
Indirect Bilirubin: 0.5 mg/dL (ref 0.0–0.9)
TOTAL PROTEIN: 6.5 g/dL (ref 6.0–8.3)
Total Bilirubin: 0.6 mg/dL (ref 0.3–1.2)

## 2013-02-07 LAB — LIPID PANEL
CHOLESTEROL: 170 mg/dL (ref 0–200)
HDL: 40 mg/dL (ref >39)
LDL Cholesterol: 113 mg/dL — ABNORMAL HIGH (ref 0–99)
TRIGLYCERIDES: 84 mg/dL (ref <150)
Total CHOL/HDL Ratio: 4.3 Ratio
VLDL: 17 mg/dL (ref 0–40)

## 2013-02-07 NOTE — Assessment & Plan Note (Signed)
Tolerating statin. Check follow up flp/lft. Continue pravastatin.

## 2013-02-07 NOTE — Progress Notes (Signed)
Subjective:    Patient ID: Glenn Wheeler, male    DOB: September 04, 1955, 59 y.o.   MRN: [id number]  HPI  Glenn Wheeler is a 58 yr old male who presents today for follow up.  1) Hyperlipidemia- maintained on pravastatin and a baby aspirin.  Reports resolution of leg pain. Reports no leg   2) Hypogonadism- not currently on testosterone. He discontinued due to concern re: risk and side effects.  3) Hx of PE- completed 2 years of therapy 10/14.   Denies calf pain, sob, or chest pain.   Review of Systems See HPI  Past Medical History  Diagnosis Date  . History of chicken pox     childhood  . Hyperlipidemia   . Colon polyps     2001, 2007  . Pulmonary embolus   . Leishmaniasis 05-2011    History   Social History  . Marital Status: Married    Spouse Name: N/A    Number of Children: N/A  . Years of Education: N/A   Occupational History  . Not on file.   Social History Main Topics  . Smoking status: Former Research scientist (life sciences)  . Smokeless tobacco: Never Used     Comment: quit 1984 1/2 ppd for 4 years  . Alcohol Use: No  . Drug Use: No  . Sexual Activity: Not on file   Other Topics Concern  . Not on file   Social History Narrative  . No narrative on file    Past Surgical History  Procedure Laterality Date  . Tendon repair  2005    right peroneal tendon-[facility]  . Rotator cuff repair  2007    left rotator cuff-[facility]  . Cataract extraction, bilateral  2009    [facility]    Family History  Problem Relation Age of Onset  . Prostate cancer Father     alive  . Hyperlipidemia Mother   . Hypertension Mother   . Diabetes Mother   . Stroke Paternal Grandfather   . Heart attack Neg Hx   . Sudden death Neg Hx   . Clotting disorder Father     blood clots  . Stroke Maternal Grandfather     No Known Allergies  Current Outpatient Prescriptions on File Prior to Visit  Medication Sig Dispense Refill  . aspirin 81 MG tablet Take 2  tablets daily.      . pravastatin (PRAVACHOL) 40 MG tablet TAKE 1 TABLET BY MOUTH DAILY  30 tablet  0  . [DISCONTINUED] fluticasone (FLONASE) 50 MCG/ACT nasal spray Place 1 spray into the nose daily.  16 g  3   No current facility-administered medications on file prior to visit.    BP 106/80  Pulse 76  Temp(Src) 97.5 F (36.4 C) (Oral)  Resp 16  Ht 6' (1.829 m)  Wt 212 lb (96.163 kg)  BMI 28.75 kg/m2  SpO2 98%       Objective:   Physical Exam  Constitutional: He is oriented to person, place, and time. He appears well-developed and well-nourished. No distress.  HENT:  Head: Normocephalic and atraumatic.  Cardiovascular: Normal rate and regular rhythm.   No murmur heard. Pulmonary/Chest: Effort normal and breath sounds normal. No respiratory distress. He has no wheezes. He has no rales. He exhibits no tenderness.  Musculoskeletal: He exhibits no edema.  Neurological: He is alert and oriented to person, place, and time.  Psychiatric: He has a normal mood and affect. His behavior is normal. Judgment and thought  content normal.          Assessment & Plan:

## 2013-02-07 NOTE — Assessment & Plan Note (Signed)
No clinical sign of recurrence, continue aspirin 81 mg.

## 2013-02-07 NOTE — Patient Instructions (Signed)
Please complete your lab work prior to leaving. Follow up in 6 months.

## 2013-02-07 NOTE — Assessment & Plan Note (Signed)
Clinically stable. Wishes to avoid testosterone rx.  Monitor.

## 2013-02-07 NOTE — Progress Notes (Signed)
Pre visit review using our clinic review tool, if applicable. No additional management support is needed unless otherwise documented below in the visit note. 

## 2013-02-10 ENCOUNTER — Encounter: Payer: Self-pay | Admitting: Family

## 2013-02-16 ENCOUNTER — Other Ambulatory Visit: Payer: Self-pay | Admitting: Family

## 2013-02-16 MED ORDER — PRAVASTATIN SODIUM 40 MG PO TABS: ORAL_TABLET | ORAL | Status: DC

## 2013-03-14 ENCOUNTER — Telehealth: Payer: Self-pay | Admitting: *Deleted

## 2013-03-14 NOTE — Telephone Encounter (Signed)
Message copied by Georgena Spurling on Fri Mar 14, 2013 10:14 AM ------      Message from: Thayer Headings      Created: Fri Mar 14, 2013  8:59 AM       Could you ask the patient a few questions regarding his treatment in [place] for leishmaniasis.  This is info I need to forward to the CDC.  I have already talked to him about it so he is aware.          See if he remembers what date he first noticed the lesion (one on the leg and one on torso).  How big they got.  How soon after starting treatment in [place] before they resolved.  Where exactly he lived in [place] for 24 years.  If he spent any significant time in [place] or if it was always [place].          Also, could you get the actually path report and initial exam record from his ENT Dr. Jenell Milliner (recent office note in my box) and if there was a fiberoptic exam done initially when he first evaluated the lesion or after.          This is all to help get the treatment for his current leishmaniasis and all the hoops to jump through to get treatment through the CDC.           I will call Mr. Stoiber later if I need other info.              Thanks ------

## 2013-03-14 NOTE — Telephone Encounter (Signed)
Fiberoptic exam was not done due to symptoms at the front of patient's nose. He did see a previous ENT in 2013 and Dr. Jenell Milliner will fax that pathology as well as other labs done in his office. Glenn Wheeler

## 2013-03-17 ENCOUNTER — Telehealth: Payer: Self-pay | Admitting: *Deleted

## 2013-03-17 NOTE — Telephone Encounter (Signed)
Requested phone call from Dr. Linus Salmons.  [phone]

## 2013-03-18 ENCOUNTER — Telehealth: Payer: Self-pay | Admitting: *Deleted

## 2013-03-18 NOTE — Telephone Encounter (Signed)
Pt informed that Dr. Linus Salmons is waiting on a response from [facility].  Pt verbalized understanding.  Pt will be notified when Dr. Linus Salmons receives information.

## 2013-03-28 ENCOUNTER — Telehealth: Payer: Self-pay | Admitting: Hematology & Oncology

## 2013-03-28 ENCOUNTER — Encounter: Payer: Self-pay | Admitting: Internal Medicine

## 2013-03-28 NOTE — Telephone Encounter (Signed)
Pt moved 3-6 to 3-27

## 2013-04-02 ENCOUNTER — Telehealth: Payer: Self-pay | Admitting: *Deleted

## 2013-04-02 NOTE — Telephone Encounter (Signed)
Patient called and advised that his ENT received the results of his nasal biopsyand have faxed them to our office. He was wanting to know what the next step will be and would like a call once Dr Linus Salmons has had a chance to look at the results. Advised the patient that Dr Linus Salmons is out of the office today but will be here tomorrow and that I will let him know the results are in house. Advised him that as soon as he knows someone will give him a call or his ENT. He advised he understands and will wait to hear from [place].

## 2013-04-04 ENCOUNTER — Other Ambulatory Visit: Payer: Self-pay | Admitting: Lab

## 2013-04-04 ENCOUNTER — Ambulatory Visit: Payer: Self-pay | Admitting: Hematology & Oncology

## 2013-04-04 NOTE — Telephone Encounter (Signed)
I also meant to say please book him into my Tuesday the 17th clinic (overbook) and let him know.  Thanks

## 2013-04-04 NOTE — Telephone Encounter (Signed)
I spoke with the patient and I have him scheduled for 3/17 at 10:15

## 2013-04-04 NOTE — Telephone Encounter (Signed)
Spoke with patient, sent exam results to CDC and once treatment decision has been made, he will be notified.

## 2013-04-07 ENCOUNTER — Encounter: Payer: Self-pay | Admitting: Hematology & Oncology

## 2013-04-08 ENCOUNTER — Telehealth: Payer: Self-pay | Admitting: *Deleted

## 2013-04-08 ENCOUNTER — Other Ambulatory Visit: Payer: Self-pay | Admitting: Internal Medicine

## 2013-04-08 DIAGNOSIS — B552 Mucocutaneous leishmaniasis: Secondary | ICD-10-CM

## 2013-04-08 NOTE — Telephone Encounter (Signed)
Called patient and notified him of appt for picc line placement for Thursday 04/10/13 7:30 AM at [facility]. Short stay notified for first dose of antibiotic and order faxed to [phone]. [facility] notified, order, demographics and insurance information faxed to pharmacy. Myrtis Hopping

## 2013-04-08 NOTE — Telephone Encounter (Signed)
Picc line scheduled for 04/10/13 at 7:30 AM, order faxed to short stay and [facility]. Patient notified Myrtis Hopping

## 2013-04-08 NOTE — Telephone Encounter (Signed)
Message copied by Georgena Spurling on Tue Apr 08, 2013 10:54 AM ------      Message from: Thayer Headings      Created: Tue Apr 08, 2013  8:59 AM       I would like to start him on lipid formulation amphotericin (Abelcet) per home health.  He should start 3 mg/kg IV once daily.  He is already aware he is going to do this and is expecting a call.  I ordered a picc line for [facility] for him.  He has an appt with me on Tue.  Antibiotics per home health protocol.       Caren Griffins, if you wouldn't mind singing it.  He is the mucocut leish pt.              thanks ------

## 2013-04-08 NOTE — Telephone Encounter (Signed)
Message copied by Georgena Spurling on Tue Apr 08, 2013 10:26 AM ------      Message from: Thayer Headings      Created: Tue Apr 08, 2013  8:59 AM       I would like to start him on lipid formulation amphotericin (Abelcet) per home health.  He should start 3 mg/kg IV once daily.  He is already aware he is going to do this and is expecting a call.  I ordered a picc line for [facility] for him.  He has an appt with me on Tue.  Antibiotics per home health protocol.       Caren Griffins, if you wouldn't mind singing it.  He is the mucocut leish pt.              thanks ------

## 2013-04-10 ENCOUNTER — Other Ambulatory Visit: Payer: Self-pay | Admitting: Internal Medicine

## 2013-04-10 ENCOUNTER — Ambulatory Visit (HOSPITAL_COMMUNITY)
Admission: RE | Admit: 2013-04-10 | Discharge: 2013-04-10 | Disposition: A | Discharge status: Home / Self Care | Payer: 59 | Source: Ambulatory Visit | Attending: Internal Medicine | Admitting: Internal Medicine

## 2013-04-10 ENCOUNTER — Other Ambulatory Visit: Payer: Self-pay | Admitting: *Deleted

## 2013-04-10 ENCOUNTER — Telehealth: Payer: Self-pay | Admitting: *Deleted

## 2013-04-10 ENCOUNTER — Encounter (HOSPITAL_COMMUNITY)
Admission: RE | Admit: 2013-04-10 | Discharge: 2013-04-10 | Disposition: A | Discharge status: Home / Self Care | Payer: 59 | Source: Ambulatory Visit | Attending: Internal Medicine | Admitting: Internal Medicine

## 2013-04-10 DIAGNOSIS — B552 Mucocutaneous leishmaniasis: Secondary | ICD-10-CM

## 2013-04-10 DIAGNOSIS — R110 Nausea: Secondary | ICD-10-CM

## 2013-04-10 DIAGNOSIS — B559 Leishmaniasis, unspecified: Secondary | ICD-10-CM | POA: Diagnosis present

## 2013-04-10 MED ORDER — ONDANSETRON HCL 4 MG PO TABS
ORAL_TABLET | ORAL | Status: AC
  Administered 2013-04-10: 8 mg via ORAL
  Filled 2013-04-10: qty 2

## 2013-04-10 MED ORDER — HEPARIN SOD (PORK) LOCK FLUSH 100 UNIT/ML IV SOLN
INTRAVENOUS | Status: AC
  Filled 2013-04-10: qty 5

## 2013-04-10 MED ORDER — HEPARIN SOD (PORK) LOCK FLUSH 100 UNIT/ML IV SOLN
250.0000 [IU] | INTRAVENOUS | Status: AC | PRN
  Administered 2013-04-10: 250 [IU]

## 2013-04-10 MED ORDER — ONDANSETRON HCL 8 MG PO TABS: 8.0000 mg | ORAL_TABLET | Freq: Three times a day (TID) | ORAL | Status: DC | PRN

## 2013-04-10 MED ORDER — DEXTROSE 5 % IV SOLN
Freq: Once | INTRAVENOUS | Status: AC
  Administered 2013-04-10: 10:00:00 via INTRAVENOUS

## 2013-04-10 MED ORDER — ACETAMINOPHEN 325 MG PO TABS: 650.0000 mg | ORAL_TABLET | Freq: Once | ORAL | Status: DC

## 2013-04-10 MED ORDER — DEXTROSE 5 % IV SOLN
3.0000 mg/kg | Freq: Once | INTRAVENOUS | Status: DC
  Filled 2013-04-10: qty 59.2

## 2013-04-10 MED ORDER — DIPHENHYDRAMINE HCL 25 MG PO TABS
50.0000 mg | ORAL_TABLET | Freq: Once | ORAL | Status: AC
  Administered 2013-04-10: 50 mg via ORAL

## 2013-04-10 MED ORDER — ACETAMINOPHEN 325 MG PO TABS
ORAL_TABLET | ORAL | Status: AC
  Administered 2013-04-10: 650 mg
  Filled 2013-04-10: qty 2

## 2013-04-10 MED ORDER — DIPHENHYDRAMINE HCL 25 MG PO CAPS
ORAL_CAPSULE | ORAL | Status: AC
  Administered 2013-04-10: 50 mg via ORAL
  Filled 2013-04-10: qty 2

## 2013-04-10 MED ORDER — ONDANSETRON HCL 4 MG PO TABS
8.0000 mg | ORAL_TABLET | Freq: Once | ORAL | Status: AC
  Administered 2013-04-10: 8 mg via ORAL

## 2013-04-10 MED ORDER — HEPARIN SOD (PORK) LOCK FLUSH 100 UNIT/ML IV SOLN: 250.0000 [IU] | INTRAVENOUS | Status: DC | PRN

## 2013-04-10 MED ORDER — AMPHOTERICIN B LIPID 5 MG/ML IV SUSP
3.0000 mg/kg | Freq: Once | INTRAVENOUS | Status: AC
  Administered 2013-04-10: 296 mg via INTRAVENOUS
  Filled 2013-04-10: qty 59.2

## 2013-04-10 NOTE — Progress Notes (Signed)
Glenn Wheeler from Dr. Linus Salmons and [facility] office called with instructions for patient for premeds for home infusions. Discussed with patient and wife and wrote it down for them. Both verbalize understanding. Office will call in Zofran 8 mg q 8 hrs prn nausea. Patient is to take Tylenol 65 0mg  po and Benedryl 25-50 po 30 min prior to infusion. He is to start with 2 5mg  Benedryl and increase to 50 if needed. He has appointment scheduled with Dr. Linus Salmons on Tuesday at 10:15 am.

## 2013-04-10 NOTE — Progress Notes (Signed)
Patient reports decreased chills.

## 2013-04-10 NOTE — Progress Notes (Addendum)
45 minutes post infusion, patient complained of chills, nausea, and lower leg and lower back aching. Paged Infectious Disease Dr. Baxter Flattery. Orders received. Patient and wife aware.

## 2013-04-10 NOTE — Telephone Encounter (Signed)
Dr. Baxter Flattery called with verbal order for patient. He had a reaction to IV antibiotic at short stay and she advised him to take Tylenol 650 mg and Benadryl 25-50 mg 30 minutes prior to each infusion. She also prescribed Zofran 8 mg every 8 hours as needed for nausea. Rx sent to [facility] and patient notified. Myrtis Hopping

## 2013-04-10 NOTE — Progress Notes (Signed)
Patient reports feeling much better.

## 2013-04-11 NOTE — Telephone Encounter (Signed)
i also spoke to [facility] that they will give Glenn Wheeler 515ml bolus of IVF before and after his abelcet infusion in addition to premedication with benadryl and tylenol +/- zofran

## 2013-04-14 ENCOUNTER — Telehealth: Payer: Self-pay | Admitting: *Deleted

## 2013-04-14 ENCOUNTER — Inpatient Hospital Stay (HOSPITAL_COMMUNITY)
Admission: AD | Admit: 2013-04-14 | Discharge: 2013-04-16 | DRG: 607 | Disposition: A | Discharge status: Home / Self Care | Payer: 59 | Source: Ambulatory Visit | Attending: Internal Medicine | Admitting: Internal Medicine

## 2013-04-14 ENCOUNTER — Encounter (HOSPITAL_COMMUNITY): Payer: Self-pay | Admitting: Internal Medicine

## 2013-04-14 ENCOUNTER — Ambulatory Visit (INDEPENDENT_AMBULATORY_CARE_PROVIDER_SITE_OTHER): Payer: 59 | Admitting: Internal Medicine

## 2013-04-14 ENCOUNTER — Encounter: Payer: Self-pay | Admitting: Internal Medicine

## 2013-04-14 VITALS — BP 110/69 | HR 69 | Temp 97.8°F | Wt 216.0 lb

## 2013-04-14 DIAGNOSIS — Z9849 Cataract extraction status, unspecified eye: Secondary | ICD-10-CM

## 2013-04-14 DIAGNOSIS — B552 Mucocutaneous leishmaniasis: Principal | ICD-10-CM | POA: Diagnosis present

## 2013-04-14 DIAGNOSIS — R110 Nausea: Secondary | ICD-10-CM

## 2013-04-14 DIAGNOSIS — Z832 Family history of diseases of the blood and blood-forming organs and certain disorders involving the immune mechanism: Secondary | ICD-10-CM

## 2013-04-14 DIAGNOSIS — Z8042 Family history of malignant neoplasm of prostate: Secondary | ICD-10-CM

## 2013-04-14 DIAGNOSIS — Z860100 Personal history of colon polyps, unspecified: Secondary | ICD-10-CM

## 2013-04-14 DIAGNOSIS — Z86711 Personal history of pulmonary embolism: Secondary | ICD-10-CM | POA: Diagnosis present

## 2013-04-14 DIAGNOSIS — Z823 Family history of stroke: Secondary | ICD-10-CM

## 2013-04-14 DIAGNOSIS — E785 Hyperlipidemia, unspecified: Secondary | ICD-10-CM | POA: Diagnosis present

## 2013-04-14 DIAGNOSIS — E291 Testicular hypofunction: Secondary | ICD-10-CM

## 2013-04-14 DIAGNOSIS — I2699 Other pulmonary embolism without acute cor pulmonale: Secondary | ICD-10-CM

## 2013-04-14 DIAGNOSIS — Z87891 Personal history of nicotine dependence: Secondary | ICD-10-CM

## 2013-04-14 DIAGNOSIS — M791 Myalgia, unspecified site: Secondary | ICD-10-CM

## 2013-04-14 DIAGNOSIS — E782 Mixed hyperlipidemia: Secondary | ICD-10-CM | POA: Diagnosis present

## 2013-04-14 DIAGNOSIS — Z833 Family history of diabetes mellitus: Secondary | ICD-10-CM

## 2013-04-14 DIAGNOSIS — Z8249 Family history of ischemic heart disease and other diseases of the circulatory system: Secondary | ICD-10-CM

## 2013-04-14 DIAGNOSIS — Z7982 Long term (current) use of aspirin: Secondary | ICD-10-CM

## 2013-04-14 DIAGNOSIS — Z79899 Other long term (current) drug therapy: Secondary | ICD-10-CM

## 2013-04-14 DIAGNOSIS — B551 Cutaneous leishmaniasis: Secondary | ICD-10-CM

## 2013-04-14 DIAGNOSIS — K644 Residual hemorrhoidal skin tags: Secondary | ICD-10-CM

## 2013-04-14 DIAGNOSIS — M7712 Lateral epicondylitis, left elbow: Secondary | ICD-10-CM

## 2013-04-14 DIAGNOSIS — N529 Male erectile dysfunction, unspecified: Secondary | ICD-10-CM

## 2013-04-14 DIAGNOSIS — E669 Obesity, unspecified: Secondary | ICD-10-CM

## 2013-04-14 LAB — CBC WITH DIFFERENTIAL/PLATELET
BASOS ABS: 0 10*3/uL (ref 0.0–0.1)
BASOS PCT: 0 % (ref 0–1)
EOS ABS: 0.1 10*3/uL (ref 0.0–0.7)
EOS PCT: 2 % (ref 0–5)
HCT: 39.7 % (ref 39.0–52.0)
Hemoglobin: 13.8 g/dL (ref 13.0–17.0)
LYMPHS ABS: 1.4 10*3/uL (ref 0.7–4.0)
Lymphocytes Relative: 34 % (ref 12–46)
MCH: 29.5 pg (ref 26.0–34.0)
MCHC: 34.8 g/dL (ref 30.0–36.0)
MCV: 84.8 fL (ref 78.0–100.0)
Monocytes Absolute: 0.5 10*3/uL (ref 0.1–1.0)
Monocytes Relative: 11 % (ref 3–12)
NEUTROS PCT: 53 % (ref 43–77)
Neutro Abs: 2.1 10*3/uL (ref 1.7–7.7)
PLATELETS: 92 10*3/uL — AB (ref 150–400)
RBC: 4.68 MIL/uL (ref 4.22–5.81)
RDW: 13.8 % (ref 11.5–15.5)
WBC: 4 10*3/uL (ref 4.0–10.5)

## 2013-04-14 LAB — COMPREHENSIVE METABOLIC PANEL
ALBUMIN: 3.7 g/dL (ref 3.5–5.2)
ALT: 19 U/L (ref 0–53)
AST: 17 U/L (ref 0–37)
Alkaline Phosphatase: 58 U/L (ref 39–117)
BUN: 24 mg/dL — ABNORMAL HIGH (ref 6–23)
CALCIUM: 9 mg/dL (ref 8.4–10.5)
CO2: 26 mEq/L (ref 19–32)
Chloride: 103 mEq/L (ref 96–112)
Creatinine, Ser: 1.01 mg/dL (ref 0.50–1.35)
GFR calc non Af Amer: 79 mL/min — ABNORMAL LOW (ref >90)
GLUCOSE: 98 mg/dL (ref 70–99)
Potassium: 4 mEq/L (ref 3.7–5.3)
SODIUM: 141 meq/L (ref 137–147)
Total Bilirubin: 0.5 mg/dL (ref 0.3–1.2)
Total Protein: 6.3 g/dL (ref 6.0–8.3)

## 2013-04-14 LAB — CK: CK TOTAL: 111 U/L (ref 7–232)

## 2013-04-14 MED ORDER — ASPIRIN 81 MG PO TABS: 162.0000 mg | ORAL_TABLET | Freq: Every day | ORAL | Status: DC

## 2013-04-14 MED ORDER — DEXTROSE 5 % IV SOLN
300.0000 mg | INTRAVENOUS | Status: DC
  Administered 2013-04-14 – 2013-04-16 (×3): 300 mg via INTRAVENOUS
  Filled 2013-04-14 (×6): qty 300

## 2013-04-14 MED ORDER — ALBUTEROL SULFATE (2.5 MG/3ML) 0.083% IN NEBU
2.5000 mg | INHALATION_SOLUTION | RESPIRATORY_TRACT | Status: DC | PRN
Start: 2013-04-14 — End: 2013-04-16

## 2013-04-14 MED ORDER — ONDANSETRON HCL 4 MG/2ML IJ SOLN
4.0000 mg | Freq: Four times a day (QID) | INTRAMUSCULAR | Status: DC | PRN
  Administered 2013-04-15: 4 mg via INTRAVENOUS
  Filled 2013-04-14: qty 2

## 2013-04-14 MED ORDER — ASPIRIN EC 81 MG PO TBEC
162.0000 mg | DELAYED_RELEASE_TABLET | Freq: Every day | ORAL | Status: DC
  Administered 2013-04-14 – 2013-04-16 (×3): 162 mg via ORAL
  Filled 2013-04-14 (×3): qty 2

## 2013-04-14 MED ORDER — ONDANSETRON HCL 4 MG PO TABS: 4.0000 mg | ORAL_TABLET | Freq: Four times a day (QID) | ORAL | Status: DC | PRN

## 2013-04-14 MED ORDER — DIPHENHYDRAMINE HCL 50 MG/ML IJ SOLN
50.0000 mg | Freq: Four times a day (QID) | INTRAMUSCULAR | Status: DC | PRN
  Administered 2013-04-15: 50 mg via INTRAVENOUS

## 2013-04-14 MED ORDER — SODIUM CHLORIDE 0.9 % IV SOLN
INTRAVENOUS | Status: AC
Start: 2013-04-14 — End: 2013-04-15
  Administered 2013-04-14: 20 mL/h via INTRAVENOUS

## 2013-04-14 MED ORDER — ACETAMINOPHEN 325 MG PO TABS
650.0000 mg | ORAL_TABLET | ORAL | Status: DC
  Administered 2013-04-14 – 2013-04-16 (×3): 650 mg via ORAL
  Filled 2013-04-14 (×3): qty 2

## 2013-04-14 MED ORDER — ACETAMINOPHEN 325 MG PO TABS: 650.0000 mg | ORAL_TABLET | ORAL | Status: DC | PRN

## 2013-04-14 MED ORDER — MUPIROCIN 2 % EX OINT
1.0000 "application " | TOPICAL_OINTMENT | Freq: Two times a day (BID) | CUTANEOUS | Status: DC
  Administered 2013-04-14 – 2013-04-16 (×4): 1 via NASAL
  Filled 2013-04-14: qty 22

## 2013-04-14 MED ORDER — MEPERIDINE HCL 25 MG/ML IJ SOLN
25.0000 mg | INTRAMUSCULAR | Status: DC
  Administered 2013-04-14 – 2013-04-16 (×3): 25 mg via INTRAVENOUS
  Filled 2013-04-14 (×3): qty 1

## 2013-04-14 MED ORDER — SODIUM CHLORIDE 0.9 % IV SOLN
INTRAVENOUS | Status: DC
  Administered 2013-04-14 – 2013-04-16 (×6): via INTRAVENOUS

## 2013-04-14 MED ORDER — SODIUM CHLORIDE 0.9 % IJ SOLN
10.0000 mL | INTRAMUSCULAR | Status: DC | PRN
  Administered 2013-04-14: 20 mL
  Administered 2013-04-15: 10 mL

## 2013-04-14 MED ORDER — DIPHENHYDRAMINE HCL 50 MG/ML IJ SOLN
50.0000 mg | INTRAMUSCULAR | Status: DC
  Administered 2013-04-14 – 2013-04-16 (×2): 50 mg via INTRAVENOUS
  Filled 2013-04-14 (×3): qty 1

## 2013-04-14 MED ORDER — MEPERIDINE HCL 25 MG/ML IJ SOLN: 25.0000 mg | INTRAMUSCULAR | Status: DC | PRN

## 2013-04-14 MED ORDER — ENOXAPARIN SODIUM 40 MG/0.4ML ~~LOC~~ SOLN
40.0000 mg | SUBCUTANEOUS | Status: DC
  Administered 2013-04-14 – 2013-04-16 (×3): 40 mg via SUBCUTANEOUS
  Filled 2013-04-14 (×3): qty 0.4

## 2013-04-14 NOTE — Progress Notes (Signed)
Subjective:    Patient ID: Glenn Wheeler, male    DOB: 10-27-1955, 58 y.o.   MRN: [id number]  HPI Glenn Wheeler is a 58yo M with has past history of cutaneous leishmaniasis(lesion to scalp and chest wall) in aug 2012 originally treated in [place], [place] with 3 wks of daily infusion. At that time did have nasal congestion but unclear if thought to be related to leishmaniasis. He returned back to the [place] and sought care with Dr. Linus Salmons in Oct 2013. Throughout this time, left nasal lesion has intermittent swelling, where he had a biopsy 5 wks ago for diagnosis. tissue was sent off to testing to the CDC which confirmed the diagnosis of mucocutaneous leishmaniasis. He was in the infusion center on 3/12 to receive his first infusion of albecet which he tolerated fairly well.  He did notice some shivers, nausea, and muscle cramps. He received zofran, benadryl, and tylenol which helped his symptoms. Important to note that this medication was not done as  Premedication at this time. At home with home health on 3/13, he had premedicated with benadryl and tylenol, but still developed intense rigors roughly an hour into the infusion. I have asked him to hold his infusions until we see him today with the intent on admitting him to the hospital to see how he tolerates the first 2-3 doses of the medication and ensure it is tolerable for his disease process. After discharge, will likely arrange to have infusion at infusion center. Current Outpatient Prescriptions on File Prior to Visit  Medication Sig Dispense Refill  . acetaminophen (TYLENOL) 325 MG tablet Take 650 mg by mouth every 6 (six) hours as needed. 30 minutes prior to IV infusion      . aspirin 81 MG tablet Take 2 tablets daily.      . diphenhydrAMINE (BENADRYL) 25 MG tablet Take 25-50 mg by mouth every 6 (six) hours as needed. 30 minutes prior to IV infusion      . mupirocin ointment (BACTROBAN) 2 % Place 1 application into the nose 2 (two) times daily.      .  ondansetron (ZOFRAN) 8 MG tablet Take 1 tablet (8 mg total) by mouth every 8 (eight) hours as needed for nausea or vomiting.  30 tablet  0  . pravastatin (PRAVACHOL) 40 MG tablet TAKE 1 TABLET BY MOUTH DAILY  30 tablet  5  . [DISCONTINUED] fluticasone (FLONASE) 50 MCG/ACT nasal spray Place 1 spray into the nose daily.  16 g  3   No current facility-administered medications on file prior to visit.   Active Ambulatory Problems    Diagnosis Date Noted  . Pulmonary embolism 11/28/2010  . Leishmaniasis, cutaneous, American 12/01/2010  . Hyperlipidemia 02/15/2011  . Obesity 02/15/2011  . Left lateral epicondylitis 04/08/2011  . Myalgia 08/18/2011  . External hemorrhoid 08/18/2011  . Erectile dysfunction 12/03/2011  . Hx of colonic polyps 01/08/2012  . Hypogonadism male 01/12/2012   Resolved Ambulatory Problems    Diagnosis Date Noted  . Nasal congestion 02/15/2011  . URI (upper respiratory infection) 06/12/2011  . Epistaxis 10/18/2012  . Acute bronchitis 12/25/2012   Past Medical History  Diagnosis Date  . History of chicken pox   . Colon polyps   . Pulmonary embolus   . Leishmaniasis 05-2011   History   Social History  . Marital Status: Married    Spouse Name: N/A    Number of Children: N/A  . Years of Education: N/A   Occupational History  .  Not on file.   Social History Main Topics  . Smoking status: Former Research scientist (life sciences)  . Smokeless tobacco: Never Used     Comment: quit 1984 1/2 ppd for 4 years  . Alcohol Use: No  . Drug Use: No  . Sexual Activity: Not on file   Other Topics Concern  . Not on file   Social History Narrative  . No narrative on file   family history includes Clotting disorder in his father; Diabetes in his mother; Hyperlipidemia in his mother; Hypertension in his mother; Prostate cancer in his father; Stroke in his maternal grandfather and paternal grandfather. There is no history of Heart attack or Sudden death.  Review of Systems  Constitutional:  Negative for fever, chills, diaphoresis, activity change, appetite change, fatigue and unexpected weight change.  HENT: positive for congestion, sore throat, rhinorrhea, sneezing, trouble swallowing and sinus pressure.  Eyes: Negative for photophobia and visual disturbance.  Respiratory: Negative for cough, chest tightness, shortness of breath, wheezing and stridor.  Cardiovascular: Negative for chest pain, palpitations and leg swelling.  Gastrointestinal: Negative for nausea, vomiting, abdominal pain, diarrhea, constipation, blood in stool, abdominal distention and anal bleeding.  Genitourinary: Negative for dysuria, hematuria, flank pain and difficulty urinating.  Musculoskeletal: Negative for myalgias, back pain, joint swelling, arthralgias and gait problem.  Skin: Negative for color change, pallor, rash and wound.  Neurological: Negative for dizziness, tremors, weakness and light-headedness.  Hematological: Negative for adenopathy. Does not bruise/bleed easily.  Psychiatric/Behavioral: Negative for behavioral problems, confusion, sleep disturbance, dysphoric mood, decreased concentration and agitation.       Objective:   Physical Exam BP 110/69  Pulse 69  Temp(Src) 97.8 F (36.6 C) (Oral)  Wt 216 lb (97.977 kg) Physical Exam  Constitutional: He is oriented to person, place, and time. He appears well-developed and well-nourished. No distress.  HENT:  Mouth/Throat: Oropharynx is clear and moist. No oropharyngeal exudate.  Cardiovascular: Normal rate, regular rhythm and normal heart sounds. Exam reveals no gallop and no friction rub.  No murmur heard.  Pulmonary/Chest: Effort normal and breath sounds normal. No respiratory distress. He has no wheezes.  Abdominal: Soft. Bowel sounds are normal. He exhibits no distension. There is no tenderness.  Lymphadenopathy:  He has no cervical adenopathy.  Neurological: He is alert and oriented to person, place, and time.  Skin: Skin is warm and  dry. No rash noted. No erythema.  Psychiatric: He has a normal mood and affect. His behavior is normal.          Assessment & Plan:  Admit for mucocutaneous leishmaniasis. Will need to premedicate as listed below and give IVF bolus before and after. Recommend to get baseline cbc with diff, cmp, nad ck. Hold statin for now.  - demerol 25mg  , as needed PRN Q 4hr - tylenol 650mg  - zofran PRN - benadryl 50mg  IV - ambizone 3mg /kg x 1, infuse over 2 hr - 545mL bolus before and after  Will ask pharmacy to help with order entry with medicaiton. hospitalist team to accept. Dr. Linus Salmons to see in consultation

## 2013-04-14 NOTE — Progress Notes (Signed)
Addendum:  Labs reviewed; CMP and CK unremarkable. CBC: Platelets 92 but seems to have chronic low platelets (117-147)- monitor closely.   Vernell Leep, MD, FACP, FHM. [facility] Pager [phone]  If 7PM-7AM, please contact night-coverage [email or web address] Password [email or web address] 04/14/2013, 2:21 PM

## 2013-04-14 NOTE — Telephone Encounter (Signed)
Per Dr. Baxter Flattery, left message with nursing manager asking her to call back.  This is regarding the patient's experience with home health nursing last week.  Nursing manager should contact Dr. Baxter Flattery for more information. Landis Gandy, RN

## 2013-04-14 NOTE — H&P (Signed)
History and Physical  Glenn Wheeler PTW:[id number] DOB: 13-Oct-1955 DOA: 04/14/2013  Referring physician: Dr. Carlyle Basques, ID PCP: Nance Pear., NP  Outpatient Specialists:  1. ID: Dr. Scharlene Gloss. 2. ENT: Dr.Carl Phipps. 3. Hematology: Dr. Burney Gauze.  Chief Complaint: left nasal crusting  HPI: Glenn Wheeler is a 58 y.o. male who is referred from [facility] for Infectious Disease for premedication and IV antibiotics for recently diagnosed mucocutaneous Leishmaniasis which he could not tolerate at home. Patient lived for a year and a half b/w 2011-2013, in the [place] where he contracted cutaneous Leishmaniasis and was treated in [place] with IV sodium stiboglunate. He returned to the [place] in mid 2012 and since then has had persistent intermittent crusting of his left nostril with associated intermittent mild bleeding. This was extensively evaluated by infectious disease by ID and initially not felt to be related to Leishmaniasis and no specific treatment was carried out until 2015 January. Due to persistent symptoms, he went back to see a different ENT M.D. where biopsy of left nostril confirmed mucocutaneous Leishmaniasis. His care is being coordinated by the CDC with the infectious disease department. PICC line was placed on 04/10/13 and in the infusion Center patient received his first dose of IV amphotericin B liposomal after premedication with Zofran, Benadryl and Tylenol. He had some rigors, low-grade fevers of 101F, nausea and lower extremity muscle aches. On 04/11/13, home health attempted repeat dose of the same medications but patient did not tolerate same and had more severe rigors and lower extremity muscle aches without nausea or fevers. Infectious disease M.D. advised him to stop the medications over the weekend. He was seen at their office and sent to the hospital for further management. Patient has no further symptoms related to IV infusion at this time.  Infectious disease will continue to consult and manage his antibiotic regimen. Hospitalist admission was requested. He has history of pulmonary embolism x1 and completed a course of Xarelto 6 months ago.  Review of Systems: All systems reviewed and apart from history of presenting illness, are negative.  Past Medical History  Diagnosis Date  . History of chicken pox     childhood  . Hyperlipidemia   . Colon polyps     2001, 2007  . Pulmonary embolus   . Leishmaniasis 05-2011   Past Surgical History  Procedure Laterality Date  . Tendon repair  2005    right peroneal tendon-[facility]  . Rotator cuff repair  2007    left rotator cuff-[facility]  . Cataract extraction, bilateral  2009    [facility]   Social History:  reports that he has quit smoking. He has never used smokeless tobacco. He reports that he does not drink alcohol or use illicit drugs. Married. Independent of activities of daily living. He is a Theme park manager.  No Known Allergies  Family History  Problem Relation Age of Onset  . Prostate cancer Father     alive  . Hyperlipidemia Mother   . Hypertension Mother   . Diabetes Mother   . Stroke Paternal Grandfather   . Heart attack Neg Hx   . Sudden death Neg Hx   . Clotting disorder Father     blood clots  . Stroke Maternal Grandfather     Prior to Admission medications   Medication Sig Start Date End Date Taking? Authorizing Provider  acetaminophen (TYLENOL) 325 MG tablet Take 650 mg by mouth every 6 (six) hours as needed.  30 minutes prior to IV infusion    Historical Provider, MD  aspirin 81 MG tablet Take 2 tablets daily.    Historical Provider, MD  diphenhydrAMINE (BENADRYL) 25 MG tablet Take 25-50 mg by mouth every 6 (six) hours as needed. 30 minutes prior to IV infusion    Historical Provider, MD  mupirocin ointment (BACTROBAN) 2 % Place 1 application into the nose 2 (two) times daily.    Historical Provider, MD    ondansetron (ZOFRAN) 8 MG tablet Take 1 tablet (8 mg total) by mouth every 8 (eight) hours as needed for nausea or vomiting. 04/10/13   Carlyle Basques, MD  pravastatin (PRAVACHOL) 40 MG tablet TAKE 1 TABLET BY MOUTH DAILY 02/16/13   Debbrah Alar, NP   Physical Exam: Filed Vitals:   04/14/13 1020  BP: 109/64  Pulse: 63  Temp: 97.6 F (36.4 C)  TempSrc: Oral  Resp: 20  Weight: 96.1 kg (211 lb 13.8 oz)  SpO2: 96%     General exam: Moderately built and nourished middle-aged male patient, lying comfortably supine on the gurney in no obvious distress.  Head, eyes and ENT: Nontraumatic and normocephalic. Pupils equally reacting to light and accommodation. Oral mucosa moist. No acute findings of left nostril-no overt bleeding or crusting to external exam.  Neck: Supple. No JVD, carotid bruit or thyromegaly.  Lymphatics: No lymphadenopathy.  Respiratory system: Clear to auscultation. No increased work of breathing.  Cardiovascular system: S1 and S2 heard, RRR. No JVD, murmurs, gallops, clicks or pedal edema.  Gastrointestinal system: Abdomen is nondistended, soft and nontender. Normal bowel sounds heard. No organomegaly or masses appreciated.  Central nervous system: Alert and oriented. No focal neurological deficits.  Extremities: Symmetric 5 x 5 power. Peripheral pulses symmetrically felt.   Skin: No rashes or acute findings.  Musculoskeletal system: Negative exam.  Psychiatry: Pleasant and cooperative.   Labs on Admission:  Admission labs have been requested (CBC, CMP and CK)  Radiological Exams on Admission: No results found.    Assessment/Plan Principal Problem:   Leishmaniasis, mucocutaneous Active Problems:   Hyperlipidemia   History of pulmonary embolism   1. Mucocutaneous Leishmaniasis: Management as per infectious disease who will consult in the hospital-continue IV liposomal amphotericin under premedication with Tylenol, Benadryl, Demerol and Zofran.  He will receive pre-and post IV normal saline bolus. Pharmacy has been consulted to assist with management of IV amphotericin. Followup on admission labs. ID recommends monitoring him for 2-3 doses of the medication in the hospital and then OP management at an infusion Center. 2. Hyperlipidemia: As per infectious disease, check CK and hold statins for now. 3. History of pulmonary embolism: Patient has completed anticoagulation with Xarelto, 6 months ago and periodically follows up with hematology.     Code Status: Full  Family Communication: Discussed with patient spouse and friends at bedside.  Disposition Plan: Home when medically stable   Time spent: 41 minutes  Kiri Hinderliter, MD, FACP, FHM. [facility] Pager [phone]  If 7PM-7AM, please contact night-coverage [email or web address] Password [email or web address] 04/14/2013, 11:45 AM

## 2013-04-14 NOTE — Consult Note (Signed)
    [facility] for Infectious Disease     Reason for Consult: mucocutaneous leishmaniasis    Referring Physician: Dr. Algis Liming  Principal Problem:   Leishmaniasis, mucocutaneous Active Problems:   Hyperlipidemia   History of pulmonary embolism   . sodium chloride   Intravenous 2 times per day  . acetaminophen  650 mg Oral Q24H  . amphotericin B liposome (AMBISOME) IV  300 mg Intravenous Q24H  . aspirin EC  162 mg Oral Daily  . diphenhydrAMINE  50 mg Intravenous Q24H  . enoxaparin (LOVENOX) injection  40 mg Subcutaneous Q24H  . meperidine (DEMEROL) injection  25 mg Intravenous Q24H  . mupirocin ointment  1 application Nasal BID    Recommendations: Lipid formulation amphotericin at 3mg /kg Premedication with acetaminophen, benadryl, demerol Please consult case management for ? Of options for infusion center rather than home health   Assessment: He has mucocutaneous leishmaniasis with previous treatment with stribogluconate for cutaneous lesion.  He will need amphotericin for ideally 3 weeks.     Antibiotics: Amphotericin x 1 dose  HPI: Glenn Wheeler is a 58 y.o. male with a history of cutaneous leishmaniasis developed symtpoms of congestion several months following treatment and did not respond to usual ENT treatments.  He then saw a different ENT the next year and noted an ulcer and biopsy eventually returned with L. [place]razili[place], which is endemic in [place].  I discussed the treatment options with him and with the CDC and we have opted for lipid amphotericin as best option.  He received one dose and experienced chills, fever, myalgias.  HH then has been uncofortable to continue.     Review of Systems: A comprehensive review of systems was negative.  Past Medical History  Diagnosis Date  . History of chicken pox     childhood  . Hyperlipidemia   . Colon polyps     2001, 2007  . Pulmonary embolus   . Leishmaniasis 05-2011    History  Substance Use Topics  .  Smoking status: Former Research scientist (life sciences)  . Smokeless tobacco: Never Used     Comment: quit 1984 1/2 ppd for 4 years  . Alcohol Use: No    Family History  Problem Relation Age of Onset  . Prostate cancer Father     alive  . Hyperlipidemia Mother   . Hypertension Mother   . Diabetes Mother   . Stroke Paternal Grandfather   . Heart attack Neg Hx   . Sudden death Neg Hx   . Clotting disorder Father     blood clots  . Stroke Maternal Grandfather    No Known Allergies  OBJECTIVE: Blood pressure 109/64, pulse 63, temperature 97.6 F (36.4 C), temperature source Oral, resp. rate 20, weight 211 lb 13.8 oz (96.1 kg), SpO2 96.00%. General: Awake, alert, nad Skin: no rashes Lungs: CTA B Cor: RRR without m Abdomen: soft, nt, nd Ext: no edema  Microbiology: No results found for this or any previous visit (from the past 240 hour(s)).  Scharlene Gloss, [facility] [place] [email or web address] [phone] pager  [phone] cell 04/14/2013, 12:32 PM

## 2013-04-14 NOTE — Care Management Note (Addendum)
  Page 2 of 2   04/14/2013     3:17:24 PM   CARE MANAGEMENT NOTE 04/14/2013  Patient:  Glenn Wheeler, Glenn Wheeler   Account Number:  [id number]  Date Initiated:  04/14/2013  Documentation initiated by:  Magdalen Spatz  Subjective/Objective Assessment:     Action/Plan:   Anticipated DC Date:     Anticipated DC Plan:           Choice offered to / List presented to:             Status of service:   Medicare Important Message given?   (If response is "NO", the following Medicare IM given date fields will be blank) Date Medicare IM given:   Date Additional Medicare IM given:    Discharge Disposition:    Per UR Regulation:    If discussed at Long Length of Stay Meetings, dates discussed:    Comments:  04-15-13 Form was completed by MD . Molli Posey Long short stay , spoke with [place] . [place] will check on request and call back Magdalen Spatz RN BSN [phone]       04-14-13 Consult for  Lipid formulation amphotericin at 3mg /kg Premedication with acetaminophen, benadryl, demerol Please consult case management for ? Of options for infusion center rather than home health He has mucocutaneous leishmaniasis with previous treatment with stribogluconate for cutaneous lesion.  He will need amphotericin for ideally 3 weeks.   Patient would prefer to go to [facility] [place] [phone], spoke with Adline Potter at [facility] , they are unable to do this , they are an ER and patient would be charged for an ED visit everytime he came .   Patient's second choice is [facility] , spoke to [place] , gave her information , her charge nurse will check with pharmacy and short stay director and call NCM back .   Short stay form on shadow chart for MD to complete and sign .  Magdalen Spatz RN BSN [phone]

## 2013-04-14 NOTE — Progress Notes (Signed)
Patient is a direct admit to [place] room 3. Dx: leishmaniasis. Alert and oriented. Denies pain. Right upper arm PICC noted. MD paged.

## 2013-04-15 ENCOUNTER — Ambulatory Visit: Payer: Self-pay | Admitting: Internal Medicine

## 2013-04-15 DIAGNOSIS — R110 Nausea: Secondary | ICD-10-CM

## 2013-04-15 LAB — BASIC METABOLIC PANEL
BUN: 20 mg/dL (ref 6–23)
CALCIUM: 8.8 mg/dL (ref 8.4–10.5)
CO2: 26 mEq/L (ref 19–32)
Chloride: 106 mEq/L (ref 96–112)
Creatinine, Ser: 1.05 mg/dL (ref 0.50–1.35)
GFR calc Af Amer: 88 mL/min — ABNORMAL LOW (ref >90)
GFR, EST NON AFRICAN AMERICAN: 76 mL/min — AB (ref >90)
GLUCOSE: 124 mg/dL — AB (ref 70–99)
Potassium: 4 mEq/L (ref 3.7–5.3)
Sodium: 143 mEq/L (ref 137–147)

## 2013-04-15 LAB — CBC
HCT: 37.9 % — ABNORMAL LOW (ref 39.0–52.0)
Hemoglobin: 13.1 g/dL (ref 13.0–17.0)
MCH: 29.6 pg (ref 26.0–34.0)
MCHC: 34.6 g/dL (ref 30.0–36.0)
MCV: 85.6 fL (ref 78.0–100.0)
PLATELETS: 79 10*3/uL — AB (ref 150–400)
RBC: 4.43 MIL/uL (ref 4.22–5.81)
RDW: 13.9 % (ref 11.5–15.5)
WBC: 3.8 10*3/uL — ABNORMAL LOW (ref 4.0–10.5)

## 2013-04-15 NOTE — Progress Notes (Signed)
UR completed. Patient changed to inpatient- requiring IV antifungal

## 2013-04-15 NOTE — Progress Notes (Addendum)
PROGRESS NOTE  Glenn Wheeler IZT:[id number] DOB: [birth year] DOA: 04/14/2013 PCP: Nance Pear., NP  Assessment/Plan: Mucocutaneous Leishmaniasis: Management as per infectious disease who will consult in the hospital-continue IV liposomal amphotericin under premedication with Tylenol, Benadryl, Demerol and Zofran. He will receive pre-and post IV normal saline bolus. Pharmacy has been consulted to assist with management of IV amphotericin.   ID recommends monitoring him for 2-3 doses of the medication in the hospital and then OP management at an infusion Center-case management consulted  Hyperlipidemia: hold statin, CK ok  History of pulmonary embolism: Patient has completed anticoagulation with Xarelto, 6 months ago and periodically follows up with hematology.       Nausea- zofran   Code Status: full Family Communication: patient Disposition Plan: home once outpatient IV set up   Consultants:  ID  Procedures:    Antibiotics:    HPI/Subjective: Feeling better No SOB, no CP  Objective: Filed Vitals:   04/15/13 0540  BP: 121/75  Pulse: 72  Temp: 98.2 F (36.8 C)  Resp: 18    Intake/Output Summary (Last 24 hours) at 04/15/13 0813 Last data filed at 04/15/13 0500  Gross per 24 hour  Intake   1910 ml  Output    850 ml  Net   1060 ml   Filed Weights   04/14/13 1020 04/15/13 0540  Weight: 96.1 kg (211 lb 13.8 oz) 215.5 kg (475 lb 1.5 oz)    Exam:  General exam: Moderately built and nourished middle-aged male patient, lying comfortably supine on the gurney in no obvious distress.  Respiratory system: Clear to auscultation. No increased work of breathing.  Cardiovascular system: S1 and S2 heard, RRR. No JVD, murmurs, gallops, clicks or pedal edema.  Gastrointestinal system: Abdomen is nondistended, soft and nontender. Normal bowel sounds heard. No organomegaly or masses appreciated.   Extremities: Symmetric 5 x 5 power. Peripheral pulses symmetrically  felt.   Data Reviewed: Basic Metabolic Panel:  Recent Labs Lab 04/14/13 1225 04/15/13 0535  NA 141 143  K 4.0 4.0  CL 103 106  CO2 26 26  GLUCOSE 98 124*  BUN 24* 20  CREATININE 1.01 1.05  CALCIUM 9.0 8.8   Liver Function Tests:  Recent Labs Lab 04/14/13 1225  AST 17  ALT 19  ALKPHOS 58  BILITOT 0.5  PROT 6.3  ALBUMIN 3.7   No results found for this basename: LIPASE, AMYLASE,  in the last 168 hours No results found for this basename: AMMONIA,  in the last 168 hours CBC:  Recent Labs Lab 04/14/13 1225 04/15/13 0535  WBC 4.0 3.8*  NEUTROABS 2.1  --   HGB 13.8 13.1  HCT 39.7 37.9*  MCV 84.8 85.6  PLT 92* 79*   Cardiac Enzymes:  Recent Labs Lab 04/14/13 1225  CKTOTAL 111   BNP (last 3 results) No results found for this basename: PROBNP,  in the last 8760 hours CBG: No results found for this basename: GLUCAP,  in the last 168 hours  No results found for this or any previous visit (from the past 240 hour(s)).   Studies: No results found.  Scheduled Meds: . sodium chloride   Intravenous 2 times per day  . acetaminophen  650 mg Oral Q24H  . amphotericin B liposome (AMBISOME) IV  300 mg Intravenous Q24H  . aspirin EC  162 mg Oral Daily  . diphenhydrAMINE  50 mg Intravenous Q24H  . enoxaparin (LOVENOX) injection  40 mg Subcutaneous Q24H  . meperidine (DEMEROL) injection  25 mg Intravenous Q24H  . mupirocin ointment  1 application Nasal BID   Continuous Infusions: . sodium chloride 20 mL/hr at 04/15/13 0500   Antibiotics Given (last 72 hours)   None      Principal Problem:   Leishmaniasis, mucocutaneous Active Problems:   Hyperlipidemia   History of pulmonary embolism    Time spent: 35 min    Talon Witting  [facility] Pager [phone]. If 7PM-7AM, please contact night-coverage at [email or web address], password [facility] 04/15/2013, 8:13 AM  LOS: 1 day

## 2013-04-15 NOTE — Discharge Instructions (Signed)
Glenn Wheeler Short Stay Thursday April 17, 2013 at 1000 for [facility] infusion  Phone number [phone]   Saturday and Sundays you will have to go to Emergency Department at [facility] be there before 1100 am

## 2013-04-15 NOTE — Progress Notes (Signed)
    [facility] for Infectious Disease  Date of Admission:  04/14/2013  Antibiotics: ambisome  Subjective: Feels well, tolerated dose of Ambisome with premedication well  Objective: Temp:  [97.6 F (36.4 C)-98.2 F (36.8 C)] 98.2 F (36.8 C) (03/17 0540) Pulse Rate:  [63-87] 72 (03/17 0540) Resp:  [18-22] 18 (03/17 0540) BP: (106-121)/(49-75) 121/75 mmHg (03/17 0540) SpO2:  [96 %-99 %] 96 % (03/17 0540) Weight:  [211 lb 13.8 oz (96.1 kg)-475 lb 1.5 oz (215.5 kg)] 475 lb 1.5 oz (215.5 kg) (03/17 0540)  General: Awake, alert, nad Skin: no rashes Lungs: CTA B Cor: RRR without m Abdomen: soft, nt, nd HEENT: Nose with dried mucous  Lab Results Lab Results  Component Value Date   WBC 3.8* 04/15/2013   HGB 13.1 04/15/2013   HCT 37.9* 04/15/2013   MCV 85.6 04/15/2013   PLT 79* 04/15/2013    Lab Results  Component Value Date   CREATININE 1.05 04/15/2013   BUN 20 04/15/2013   NA 143 04/15/2013   K 4.0 04/15/2013   CL 106 04/15/2013   CO2 26 04/15/2013    Lab Results  Component Value Date   ALT 19 04/14/2013   AST 17 04/14/2013   ALKPHOS 58 04/14/2013   BILITOT 0.5 04/14/2013      Microbiology: No results found for this or any previous visit (from the past 240 hour(s)).  Studies/Results: No results found.  Assessment/Plan: 1)  Mucocutaneous leishmaniasis: tolerating ambisome.  Will try for 3 week course if he shows improvement.  I will discuss follow up with his ENT to monitor.  I appreciate [facility] and case management assistance in getting this arranged.    Scharlene Gloss, [place] for Infectious Disease [place] [email or web address] [phone] pager   [phone] cell 04/15/2013, 9:23 AM

## 2013-04-15 NOTE — Care Management (Signed)
  [place] at [facility] called back , unable to schedule appointment until April 23, 2013    Spoke with Cyril Mourning at [facility] , patient can go there Monday through Friday for infusions first appointment is  Thursday April 17, 2013 at 1000 for Ambisone infusion  Phone number [phone]   Saturday and Sundays patient  will have to go to Emergency Department at [facility] , spoke with assistant Director of ED Drue Dun unable to schedule appointment but patient needs to go to ED before 1100 am .   Faxed Physician Order sheet to [place] in short stay  ( [phone]) and Drue Dun in the ED at [place] . Also spoke with ED charge nurse . Confirmed both faxes went through .   Information on discharge instructions and discussed with patient .  Magdalen Spatz RN BSN [phone]

## 2013-04-16 ENCOUNTER — Other Ambulatory Visit (HOSPITAL_COMMUNITY): Payer: Self-pay | Admitting: *Deleted

## 2013-04-16 LAB — BASIC METABOLIC PANEL
BUN: 21 mg/dL (ref 6–23)
CO2: 26 mEq/L (ref 19–32)
CREATININE: 1.16 mg/dL (ref 0.50–1.35)
Calcium: 8.4 mg/dL (ref 8.4–10.5)
Chloride: 107 mEq/L (ref 96–112)
GFR calc Af Amer: 78 mL/min — ABNORMAL LOW (ref >90)
GFR calc non Af Amer: 67 mL/min — ABNORMAL LOW (ref >90)
Glucose, Bld: 104 mg/dL — ABNORMAL HIGH (ref 70–99)
POTASSIUM: 3.8 meq/L (ref 3.7–5.3)
Sodium: 146 mEq/L (ref 137–147)

## 2013-04-16 LAB — CBC
HCT: 37.9 % — ABNORMAL LOW (ref 39.0–52.0)
Hemoglobin: 13 g/dL (ref 13.0–17.0)
MCH: 29.3 pg (ref 26.0–34.0)
MCHC: 34.3 g/dL (ref 30.0–36.0)
MCV: 85.6 fL (ref 78.0–100.0)
Platelets: 84 10*3/uL — ABNORMAL LOW (ref 150–400)
RBC: 4.43 MIL/uL (ref 4.22–5.81)
RDW: 13.6 % (ref 11.5–15.5)
WBC: 5.6 10*3/uL (ref 4.0–10.5)

## 2013-04-16 MED ORDER — POLYETHYLENE GLYCOL 3350 17 G PO PACK
17.0000 g | PACK | Freq: Once | ORAL | Status: AC
  Administered 2013-04-16: 17 g via ORAL
  Filled 2013-04-16: qty 1

## 2013-04-16 NOTE — Discharge Summary (Signed)
Physician Discharge Summary  NIRVAN MUCCIO SHU:[id number] DOB: Aug 31, 1955 DOA: 04/14/2013  PCP: Lemont Fillers., NP  Admit date: 04/14/2013 Discharge date: 04/16/2013  Time spent: 35 minutes  Recommendations for Outpatient Follow-up:  -[facility] outpatient infusion center Monday through Friday for infusions first appointment is Thursday April 17, 2013 at 1000 for Ambisone infusion  Phone number [phone]  Saturday and Sundays patient will have to go to Emergency Department at [facility], unable to schedule appointment but patient needs to go to ED before 1100 am .    Discharge Diagnoses:  Principal Problem:   Leishmaniasis, mucocutaneous Active Problems:   Hyperlipidemia   History of pulmonary embolism   Discharge Condition: improved  Diet recommendation: cardiac  Filed Weights   04/14/13 1020 04/15/13 0540 04/16/13 0533  Weight: 96.1 kg (211 lb 13.8 oz) 97.75 kg (215 lb 8 oz) 97.206 kg (214 lb 4.8 oz)    History of present illness:  MAXI VAIL is a 58 y.o. male who is referred from [facility] for Infectious Disease for premedication and IV antibiotics for recently diagnosed mucocutaneous Leishmaniasis which he could not tolerate at home. Patient lived for a year and a half b/w 2011-2013, in the [place] where he contracted cutaneous Leishmaniasis and was treated in [place] with IV sodium stiboglunate. He returned to the [place] in mid 2012 and since then has had persistent intermittent crusting of his left nostril with associated intermittent mild bleeding. This was extensively evaluated by infectious disease by ID and initially not felt to be related to Leishmaniasis and no specific treatment was carried out until 2015 January. Due to persistent symptoms, he went back to see a different ENT M.D. where biopsy of left nostril confirmed mucocutaneous Leishmaniasis. His care is being coordinated by the CDC with the infectious disease department. PICC line was placed on 04/10/13 and  in the infusion Center patient received his first dose of IV amphotericin B liposomal after premedication with Zofran, Benadryl and Tylenol. He had some rigors, low-grade fevers of 101F, nausea and lower extremity muscle aches. On 04/11/13, home health attempted repeat dose of the same medications but patient did not tolerate same and had more severe rigors and lower extremity muscle aches without nausea or fevers. Infectious disease M.D. advised him to stop the medications over the weekend. He was seen at their office and sent to the hospital for further management. Patient has no further symptoms related to IV infusion at this time. Infectious disease will continue to consult and manage his antibiotic regimen. Hospitalist admission was requested. He has history of pulmonary embolism x1 and completed a course of Xarelto 6 months ago.   Hospital Course:  Mucocutaneous Leishmaniasis:  IV liposomal amphotericin under premedication with Tylenol, Benadryl, Demerol and Zofran. He will receive pre-and post IV normal saline bolus. Outpatient infusion has been arranged- tolerating well in hospital  Hyperlipidemia: hold statin until abx complete, CK ok   History of pulmonary embolism: Patient has completed anticoagulation with Xarelto, 6 months ago and periodically follows up with hematology.   Nausea- zofran   Procedures:    Consultations:  ID  Discharge Exam: Filed Vitals:   04/16/13 0533  BP: 125/69  Pulse: 65  Temp: 97.7 F (36.5 C)  Resp: 18    General: A+Ox3, NAd Cardiovascular: rrr Respiratory: clear anterior  Discharge Instructions      Discharge Orders   Future Appointments Provider Department Dept Phone   04/17/2013 10:00 AM [facility] Room [facility] [phone]   04/18/2013 10:00 AM [facility] Room [place] [phone]   04/25/2013 11:15 AM Gwendolyn Oklee  [phone]   04/25/2013 11:45 AM Volanda Napoleon, MD [place] [phone]   Future Orders Complete By Expires   Diet - low sodium heart healthy  As directed    Discharge instructions  As directed    Comments:     Hold Pravachol until antibiotics have been completed [facility]-Monday through Friday for infusions first appointment is  Thursday April 17, 2013 at 1000 for Ambisone infusion   Phone number [phone]    Saturday and Sundays patient  will have to go to Emergency Department at [facility], unable to schedule appointment but patient needs to go to ED before 1100 am .   Increase activity slowly  As directed        Medication List    STOP taking these medications       pravastatin 40 MG tablet  Commonly known as:  PRAVACHOL      TAKE these medications       acetaminophen 325 MG tablet  Commonly known as:  TYLENOL  Take 325-650 mg by mouth every 6 (six) hours as needed for mild pain, moderate pain, fever or headache. 30 minutes prior to IV infusion     aspirin 81 MG tablet  Take 2 tablets daily.     diphenhydrAMINE 25 MG tablet  Commonly known as:  BENADRYL  Take 25-50 mg by mouth every 6 (six) hours as needed. 30 minutes prior to IV infusion     mupirocin ointment 2 %  Commonly known as:  BACTROBAN  Place 1 application into the nose 2 (two) times daily.     ondansetron 8 MG tablet  Commonly known as:  ZOFRAN  Take 1 tablet (8 mg total) by mouth every 8 (eight) hours as needed for nausea or vomiting.       No Known Allergies    The results of significant diagnostics from this hospitalization (including imaging, microbiology, ancillary and laboratory) are listed below for reference.    Significant Diagnostic Studies: Ir Fluoro Guide Cv Line Right  04/10/2013   CLINICAL DATA:  Infection ; need for IV medications  EXAM: PICC LINE PLACEMENT WITH ULTRASOUND AND FLUOROSCOPIC GUIDANCE  FLUOROSCOPY TIME:  Eighteen seconds   PROCEDURE: The patient was advised of the possible risks and complications and agreed to undergo the procedure. The patient was then brought to the angiographic suite for the procedure.  The right arm was prepped with chlorhexidine, draped in the usual sterile fashion using maximum barrier technique (cap and mask, sterile gown, sterile gloves, large sterile sheet, hand hygiene and cutaneous antisepsis) and infiltrated locally with 1% Lidocaine.  Ultrasound demonstrated patency of the right basilic vein, and this was documented with an image. Under real-time ultrasound guidance, this vein was accessed with a 21 gauge micropuncture needle and image documentation was performed. A 0.018 wire was introduced in to the vein. Over this, a 5 [place] single lumen power injectable PICC was advanced to the lower SVC/right atrial junction. Fluoroscopy during the procedure and fluoro spot radiograph confirms appropriate catheter position. The catheter was flushed and covered with a sterile dressing.  Complications: None  IMPRESSION: Successful right arm power PICC line placement with ultrasound and fluoroscopic guidance. The catheter is ready for use.   Electronically  Signed   By: Markus Daft M.D.   On: 04/10/2013 11:53   Ir US Guide Vasc Access Right  04/10/2013   CLINICAL DATA:  Infection ; need for IV medications  EXAM: PICC LINE PLACEMENT WITH ULTRASOUND AND FLUOROSCOPIC GUIDANCE  FLUOROSCOPY TIME:  Eighteen seconds  PROCEDURE: The patient was advised of the possible risks and complications and agreed to undergo the procedure. The patient was then brought to the angiographic suite for the procedure.  The right arm was prepped with chlorhexidine, draped in the usual sterile fashion using maximum barrier technique (cap and mask, sterile gown, sterile gloves, large sterile sheet, hand hygiene and cutaneous antisepsis) and infiltrated locally with 1% Lidocaine.  Ultrasound demonstrated patency of the right basilic vein, and this  was documented with an image. Under real-time ultrasound guidance, this vein was accessed with a 21 gauge micropuncture needle and image documentation was performed. A 0.018 wire was introduced in to the vein. Over this, a 5 [place] single lumen power injectable PICC was advanced to the lower SVC/right atrial junction. Fluoroscopy during the procedure and fluoro spot radiograph confirms appropriate catheter position. The catheter was flushed and covered with a sterile dressing.  Complications: None  IMPRESSION: Successful right arm power PICC line placement with ultrasound and fluoroscopic guidance. The catheter is ready for use.   Electronically Signed   By: Markus Daft M.D.   On: 04/10/2013 11:53    Microbiology: No results found for this or any previous visit (from the past 240 hour(s)).   Labs: Basic Metabolic Panel:  Recent Labs Lab 04/14/13 1225 04/15/13 0535 04/16/13 0503  NA 141 143 146  K 4.0 4.0 3.8  CL 103 106 107  CO2 26 26 26   GLUCOSE 98 124* 104*  BUN 24* 20 21  CREATININE 1.01 1.05 1.16  CALCIUM 9.0 8.8 8.4   Liver Function Tests:  Recent Labs Lab 04/14/13 1225  AST 17  ALT 19  ALKPHOS 58  BILITOT 0.5  PROT 6.3  ALBUMIN 3.7   No results found for this basename: LIPASE, AMYLASE,  in the last 168 hours No results found for this basename: AMMONIA,  in the last 168 hours CBC:  Recent Labs Lab 04/14/13 1225 04/15/13 0535 04/16/13 0503  WBC 4.0 3.8* 5.6  NEUTROABS 2.1  --   --   HGB 13.8 13.1 13.0  HCT 39.7 37.9* 37.9*  MCV 84.8 85.6 85.6  PLT 92* 79* 84*   Cardiac Enzymes:  Recent Labs Lab 04/14/13 1225  CKTOTAL 111   BNP: BNP (last 3 results) No results found for this basename: PROBNP,  in the last 8760 hours CBG: No results found for this basename: GLUCAP,  in the last 168 hours     Signed:  Eulogio Bear  [facility] 04/16/2013, 8:46 AM

## 2013-04-16 NOTE — Progress Notes (Signed)
    [facility] for Infectious Disease  Date of Admission:  04/14/2013  Antibiotics: Ambisome day 3  Subjective: No complaints, current cocktail seems to be working  Objective: Temp:  [97.7 F (36.5 C)-98.4 F (36.9 C)] 97.7 F (36.5 C) (03/18 0533) Pulse Rate:  [65-78] 65 (03/18 0533) Resp:  [16-18] 18 (03/18 0533) BP: (121-125)/(67-69) 125/69 mmHg (03/18 0533) SpO2:  [96 %-98 %] 98 % (03/18 0533) Weight:  [214 lb 4.8 oz (97.206 kg)] 214 lb 4.8 oz (97.206 kg) (03/18 0533)  General: Awake, alert, nad Skin: no rashes, no lesions Lungs: CTA B Ext: no edema  Lab Results Lab Results  Component Value Date   WBC 5.6 04/16/2013   HGB 13.0 04/16/2013   HCT 37.9* 04/16/2013   MCV 85.6 04/16/2013   PLT 84* 04/16/2013    Lab Results  Component Value Date   CREATININE 1.16 04/16/2013   BUN 21 04/16/2013   NA 146 04/16/2013   K 3.8 04/16/2013   CL 107 04/16/2013   CO2 26 04/16/2013    Lab Results  Component Value Date   ALT 19 04/14/2013   AST 17 04/14/2013   ALKPHOS 58 04/14/2013   BILITOT 0.5 04/14/2013      Microbiology: No results found for this or any previous visit (from the past 240 hour(s)).  Studies/Results: No results found.  Assessment/Plan: 1) mucocutaneous leishmaniasis - tolerating Ambisome well and will continue for projected 3 weeks if he has continued improvement.  Longer if needed or will stop if no improvement.   He should get same demerol, Tylenol, benadryl premeds.  ED on weekends.  Appreciate [facility] and CM help in arranging this.    We will arrange follow up in [facility] and in his ENT office.  thanks  Thanks  Scharlene Gloss, [facility] for Infectious Disease [place] [email or web address] [phone] pager   [phone] cell 04/16/2013, 1:37 PM

## 2013-04-16 NOTE — Progress Notes (Signed)
Discharge instructions reviewed with patient and wife. Questions answered re: plan and premedications for remaining doses of IV antifungal. Patient verbalizes understanding. Printed AVS given to patient. Patient discharged to home. Accompanied by wife.

## 2013-04-17 ENCOUNTER — Encounter (HOSPITAL_COMMUNITY)
Admission: RE | Admit: 2013-04-17 | Discharge: 2013-04-17 | Disposition: A | Discharge status: Home / Self Care | Payer: 59 | Source: Ambulatory Visit | Attending: Internal Medicine | Admitting: Internal Medicine

## 2013-04-17 ENCOUNTER — Telehealth: Payer: Self-pay | Admitting: Hematology & Oncology

## 2013-04-17 MED ORDER — DEXTROSE 5 % IV SOLN
300.0000 mg | Freq: Every day | INTRAVENOUS | Status: DC
  Administered 2013-04-17: 300 mg via INTRAVENOUS
  Filled 2013-04-17: qty 300

## 2013-04-17 MED ORDER — DEXTROSE 5 % IV SOLN
Freq: Every day | INTRAVENOUS | Status: DC
  Administered 2013-04-17: 12:00:00 via INTRAVENOUS

## 2013-04-17 MED ORDER — SODIUM CHLORIDE 0.9 % IV SOLN
Freq: Two times a day (BID) | INTRAVENOUS | Status: DC | PRN
  Administered 2013-04-17: 10:00:00 via INTRAVENOUS

## 2013-04-17 MED ORDER — DIPHENHYDRAMINE HCL 50 MG/ML IJ SOLN: 50.0000 mg | Freq: Every day | INTRAMUSCULAR | Status: DC

## 2013-04-17 MED ORDER — ACETAMINOPHEN 325 MG PO TABS
ORAL_TABLET | ORAL | Status: AC
  Filled 2013-04-17: qty 2

## 2013-04-17 MED ORDER — HEPARIN SOD (PORK) LOCK FLUSH 100 UNIT/ML IV SOLN: 250.0000 [IU] | Freq: Every day | INTRAVENOUS | Status: DC

## 2013-04-17 MED ORDER — HEPARIN SOD (PORK) LOCK FLUSH 100 UNIT/ML IV SOLN
250.0000 [IU] | INTRAVENOUS | Status: DC | PRN
  Administered 2013-04-17: 250 [IU]

## 2013-04-17 MED ORDER — ACETAMINOPHEN 325 MG PO TABS
650.0000 mg | ORAL_TABLET | Freq: Every day | ORAL | Status: DC
  Administered 2013-04-17: 650 mg via ORAL

## 2013-04-17 MED ORDER — HEPARIN SOD (PORK) LOCK FLUSH 100 UNIT/ML IV SOLN
INTRAVENOUS | Status: AC
  Filled 2013-04-17: qty 5

## 2013-04-17 MED ORDER — DIPHENHYDRAMINE HCL 50 MG/ML IJ SOLN
INTRAMUSCULAR | Status: AC
  Administered 2013-04-17: 50 mg
  Filled 2013-04-17: qty 1

## 2013-04-17 MED ORDER — MEPERIDINE HCL 25 MG/ML IJ SOLN
25.0000 mg | Freq: Every day | INTRAMUSCULAR | Status: DC
  Administered 2013-04-17: 25 mg via INTRAVENOUS
  Filled 2013-04-17: qty 1

## 2013-04-17 NOTE — Progress Notes (Signed)
Patient reports that he was on O2 via n/c during infusion when he was an inpatient. Called Dr. Linus Salmons and asked if patient needs to be on O2. Dr. Linus Salmons said no. Also clarified that 04/14/13 is to count as day one of infusion. Last day of infusion is to be 05/04/13. Patient verbalizes understanding.

## 2013-04-17 NOTE — Telephone Encounter (Signed)
Pt cx 3-27 said he needed appointment at short stay and said he would call back to reschedule

## 2013-04-17 NOTE — Progress Notes (Signed)
Patient complains about severe thirst starting last night, and ache in left lower abdomen this am. Denies history of diabetes, and states that the ache has gone away. Called Dr. Linus Salmons. Dr. Linus Salmons states that patient's recent blood sugar was fine, and it is okay to proceed with medication as ordered today. Discussed with pharmacist as well. Thirst not listed as adverse affect of this drug. Patient aware.

## 2013-04-18 ENCOUNTER — Encounter (HOSPITAL_COMMUNITY)
Admission: RE | Admit: 2013-04-18 | Discharge: 2013-04-18 | Disposition: A | Discharge status: Home / Self Care | Payer: 59 | Source: Ambulatory Visit | Attending: Internal Medicine | Admitting: Internal Medicine

## 2013-04-18 MED ORDER — SODIUM CHLORIDE 0.9 % IV SOLN
Freq: Two times a day (BID) | INTRAVENOUS | Status: DC | PRN
  Administered 2013-04-18: 11:00:00 via INTRAVENOUS

## 2013-04-18 MED ORDER — DIPHENHYDRAMINE HCL 50 MG/ML IJ SOLN: 50.0000 mg | Freq: Every day | INTRAMUSCULAR | Status: DC

## 2013-04-18 MED ORDER — MEPERIDINE HCL 25 MG/ML IJ SOLN
25.0000 mg | Freq: Every day | INTRAMUSCULAR | Status: DC
  Administered 2013-04-18: 25 mg via INTRAVENOUS
  Filled 2013-04-18: qty 1

## 2013-04-18 MED ORDER — HEPARIN SOD (PORK) LOCK FLUSH 100 UNIT/ML IV SOLN
250.0000 [IU] | INTRAVENOUS | Status: DC | PRN
  Administered 2013-04-18: 250 [IU]

## 2013-04-18 MED ORDER — DEXTROSE 5 % IV SOLN
300.0000 mg | Freq: Every day | INTRAVENOUS | Status: DC
  Administered 2013-04-18: 300 mg via INTRAVENOUS
  Filled 2013-04-18: qty 300

## 2013-04-18 MED ORDER — HEPARIN SOD (PORK) LOCK FLUSH 100 UNIT/ML IV SOLN
INTRAVENOUS | Status: AC
  Administered 2013-04-18: 250 [IU]
  Filled 2013-04-18: qty 5

## 2013-04-18 MED ORDER — ACETAMINOPHEN 325 MG PO TABS
ORAL_TABLET | ORAL | Status: AC
  Administered 2013-04-18: 650 mg
  Filled 2013-04-18: qty 2

## 2013-04-18 MED ORDER — ACETAMINOPHEN 325 MG PO TABS: 650.0000 mg | ORAL_TABLET | Freq: Every day | ORAL | Status: DC

## 2013-04-18 MED ORDER — DIPHENHYDRAMINE HCL 50 MG/ML IJ SOLN
INTRAMUSCULAR | Status: AC
  Administered 2013-04-18: 50 mg
  Filled 2013-04-18: qty 1

## 2013-04-18 MED ORDER — HEPARIN SOD (PORK) LOCK FLUSH 100 UNIT/ML IV SOLN: 250.0000 [IU] | Freq: Every day | INTRAVENOUS | Status: DC

## 2013-04-18 MED ORDER — DEXTROSE 5 % IV SOLN: Freq: Every day | INTRAVENOUS | Status: DC

## 2013-04-19 ENCOUNTER — Emergency Department (HOSPITAL_COMMUNITY)
Admission: EM | Admit: 2013-04-19 | Discharge: 2013-04-19 | Disposition: A | Discharge status: Home / Self Care | Payer: 59 | Attending: Emergency Medicine | Admitting: Emergency Medicine

## 2013-04-19 ENCOUNTER — Encounter (HOSPITAL_COMMUNITY): Payer: Self-pay | Admitting: Emergency Medicine

## 2013-04-19 DIAGNOSIS — Z792 Long term (current) use of antibiotics: Secondary | ICD-10-CM | POA: Insufficient documentation

## 2013-04-19 DIAGNOSIS — Z7982 Long term (current) use of aspirin: Secondary | ICD-10-CM | POA: Insufficient documentation

## 2013-04-19 DIAGNOSIS — E785 Hyperlipidemia, unspecified: Secondary | ICD-10-CM | POA: Insufficient documentation

## 2013-04-19 DIAGNOSIS — Z86711 Personal history of pulmonary embolism: Secondary | ICD-10-CM | POA: Insufficient documentation

## 2013-04-19 DIAGNOSIS — Z860100 Personal history of colon polyps, unspecified: Secondary | ICD-10-CM | POA: Insufficient documentation

## 2013-04-19 DIAGNOSIS — Z87891 Personal history of nicotine dependence: Secondary | ICD-10-CM | POA: Insufficient documentation

## 2013-04-19 DIAGNOSIS — Z79899 Other long term (current) drug therapy: Secondary | ICD-10-CM

## 2013-04-19 DIAGNOSIS — B552 Mucocutaneous leishmaniasis: Secondary | ICD-10-CM | POA: Insufficient documentation

## 2013-04-19 LAB — CBC WITH DIFFERENTIAL/PLATELET
Basophils Absolute: 0 10*3/uL (ref 0.0–0.1)
Basophils Relative: 1 % (ref 0–1)
Eosinophils Absolute: 0.1 10*3/uL (ref 0.0–0.7)
Eosinophils Relative: 1 % (ref 0–5)
HCT: 37.7 % — ABNORMAL LOW (ref 39.0–52.0)
Hemoglobin: 13.5 g/dL (ref 13.0–17.0)
LYMPHS ABS: 1.2 10*3/uL (ref 0.7–4.0)
Lymphocytes Relative: 23 % (ref 12–46)
MCH: 29.5 pg (ref 26.0–34.0)
MCHC: 35.8 g/dL (ref 30.0–36.0)
MCV: 82.5 fL (ref 78.0–100.0)
Monocytes Absolute: 0.4 10*3/uL (ref 0.1–1.0)
Monocytes Relative: 8 % (ref 3–12)
NEUTROS PCT: 67 % (ref 43–77)
Neutro Abs: 3.5 10*3/uL (ref 1.7–7.7)
Platelets: 111 10*3/uL — ABNORMAL LOW (ref 150–400)
RBC: 4.57 MIL/uL (ref 4.22–5.81)
RDW: 13.2 % (ref 11.5–15.5)
WBC: 5.2 10*3/uL (ref 4.0–10.5)

## 2013-04-19 LAB — COMPREHENSIVE METABOLIC PANEL
ALT: 63 U/L — ABNORMAL HIGH (ref 0–53)
AST: 36 U/L (ref 0–37)
Albumin: 3.4 g/dL — ABNORMAL LOW (ref 3.5–5.2)
Alkaline Phosphatase: 72 U/L (ref 39–117)
BUN: 30 mg/dL — ABNORMAL HIGH (ref 6–23)
CHLORIDE: 105 meq/L (ref 96–112)
CO2: 25 meq/L (ref 19–32)
CREATININE: 1.33 mg/dL (ref 0.50–1.35)
Calcium: 8.8 mg/dL (ref 8.4–10.5)
GFR calc Af Amer: 66 mL/min — ABNORMAL LOW (ref >90)
GFR calc non Af Amer: 57 mL/min — ABNORMAL LOW (ref >90)
GLUCOSE: 106 mg/dL — AB (ref 70–99)
Potassium: 3.3 mEq/L — ABNORMAL LOW (ref 3.7–5.3)
Sodium: 143 mEq/L (ref 137–147)
Total Bilirubin: 0.5 mg/dL (ref 0.3–1.2)
Total Protein: 6 g/dL (ref 6.0–8.3)

## 2013-04-19 MED ORDER — AMPHOTERICIN B LIPOSOME 50 MG IV SUSR
3.0000 mg/kg | Freq: Once | INTRAVENOUS | Status: AC
  Administered 2013-04-19: 290 mg via INTRAVENOUS
  Filled 2013-04-19: qty 290

## 2013-04-19 MED ORDER — DIPHENHYDRAMINE HCL 50 MG/ML IJ SOLN
50.0000 mg | Freq: Once | INTRAMUSCULAR | Status: AC
  Administered 2013-04-19: 50 mg via INTRAVENOUS
  Filled 2013-04-19: qty 1

## 2013-04-19 MED ORDER — SODIUM CHLORIDE 0.9 % IV BOLUS (SEPSIS)
500.0000 mL | Freq: Once | INTRAVENOUS | Status: AC
  Administered 2013-04-19: 500 mL via INTRAVENOUS

## 2013-04-19 MED ORDER — ACETAMINOPHEN 500 MG PO TABS
500.0000 mg | ORAL_TABLET | Freq: Once | ORAL | Status: AC
  Administered 2013-04-19: 500 mg via ORAL
  Filled 2013-04-19: qty 1

## 2013-04-19 MED ORDER — ONDANSETRON HCL 4 MG/2ML IJ SOLN
4.0000 mg | Freq: Once | INTRAMUSCULAR | Status: AC
  Administered 2013-04-19: 4 mg via INTRAVENOUS
  Filled 2013-04-19: qty 2

## 2013-04-19 MED ORDER — DEXTROSE 5 % IV SOLN: 3.0000 mg/kg | Freq: Once | INTRAVENOUS | Status: DC

## 2013-04-19 MED ORDER — MEPERIDINE HCL 25 MG/ML IJ SOLN
25.0000 mg | Freq: Once | INTRAMUSCULAR | Status: AC
  Administered 2013-04-19: 25 mg via INTRAVENOUS
  Filled 2013-04-19: qty 1

## 2013-04-19 MED ORDER — ACETAMINOPHEN 325 MG PO TABS
650.0000 mg | ORAL_TABLET | Freq: Once | ORAL | Status: AC
  Administered 2013-04-19: 650 mg via ORAL
  Filled 2013-04-19: qty 2

## 2013-04-19 NOTE — ED Notes (Signed)
IV team at bedside 

## 2013-04-19 NOTE — ED Notes (Signed)
Pt presents for infusion of pt states that he has completed 5 infusion of ampotericin B. Pt previously and been admitted for a reaction. Pt was told to come to ED for infusion today and tomorrow related to short stay's hours. Pt states that he has had  A "common reaction" with severe "shake and bake" and fever. Pt reports condition is made better with premedication. See discharge notes.

## 2013-04-19 NOTE — ED Provider Notes (Signed)
CSN: [id number]     Arrival date & time 04/19/13  2979 History   First MD Initiated Contact with Patient 04/19/13 [phone]     Chief Complaint  Patient presents with  . 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 Patient followed by ID, Dr. Linus Salmons. Patient receiving Amphotericin B infusions daily for 21 days total. Patient presents today for his 6th infusion of his series of 21. PMH significant for PE in 10/2010 that was treated with xarelto x 2 years without any recurrence. Patient follows with Hematology q 6 months.  Patient denies any new symptoms since starting infusions.  Patient has ADRs to Amphotericin infusions: Shakiness, severe myalgias, nausea, and fever. Patient receives Benadryl 50mg , Demerol 25mg , zofran 8 mg, and tylenol 650 mg  30 minutes prior to 300mg  Amphotericin infusion to prevent side effects.   Past Medical History  Diagnosis Date  . History of chicken pox     childhood  . Hyperlipidemia   . Colon polyps     2001, 2007  . Pulmonary embolus   . Leishmaniasis 05-2011   Past Surgical History  Procedure Laterality Date  . Tendon repair  2005    right peroneal tendon-[facility]  . Rotator cuff repair  2007    left rotator cuff-[facility]  . Cataract extraction, bilateral  2009    [facility]   Family History  Problem Relation Age of Onset  . Prostate cancer Father     alive  . Hyperlipidemia Mother   . Hypertension Mother   . Diabetes Mother   . Stroke Paternal Grandfather   . Heart attack Neg Hx   . Sudden death Neg Hx   . Clotting disorder Father     blood clots  . Stroke Maternal Grandfather    History  Substance Use Topics  . Smoking status: Former Research scientist (life sciences)  . Smokeless tobacco: Never Used     Comment: quit 1984 1/2 ppd for 4 years  . Alcohol  Use: No    Review of Systems  All other systems reviewed and are negative.      Allergies  Review of patient's allergies indicates no known allergies.  Home Medications   Current Outpatient Rx  Name  Route  Sig  Dispense  Refill  . aspirin 81 MG tablet   Oral   Take 162 mg by mouth at bedtime.          . mupirocin ointment (BACTROBAN) 2 %   Nasal   Place 1 application into the nose 2 (two) times daily.         . ondansetron (ZOFRAN) 8 MG tablet   Oral   Take 1 tablet (8 mg total) by mouth every 8 (eight) hours as needed for nausea or vomiting.   30 tablet   0   . pravastatin (PRAVACHOL) 40 MG tablet   Oral   Take 40 mg by mouth daily.          BP 106/65  Pulse 65  Temp(Src) 98.6 F (37 C) (Oral)  Resp 16  Wt 213 lb 7 oz (96.815 kg)  SpO2 99% Physical Exam  Nursing note and vitals reviewed. Constitutional: He is oriented to person, place, and time. He appears well-developed and well-nourished. No distress.  HENT:  Head: Normocephalic and atraumatic.  1-2 cm crusted plaque like lesion to Left nasal septum.   Eyes: Conjunctivae  and EOM are normal.  Neck: Normal range of motion. Neck supple. No tracheal deviation present.  Cardiovascular: Normal rate, regular rhythm and normal heart sounds.   Pulmonary/Chest: Effort normal and breath sounds normal. No respiratory distress.  Abdominal: Soft. There is no tenderness.  Musculoskeletal: Normal range of motion. He exhibits no edema.  Neurological: He is alert and oriented to person, place, and time.  Skin: Skin is warm and dry. No rash noted. He is not diaphoretic.  Psychiatric: He has a normal mood and affect. His behavior is normal.    ED Course  Procedures (including critical care time) Labs Review Labs Reviewed  COMPREHENSIVE METABOLIC PANEL - Abnormal; Notable for the following:    Potassium 3.3 (*)    Glucose, Bld 106 (*)    BUN 30 (*)    Albumin 3.4 (*)    ALT 63 (*)    GFR calc non Af Amer 57  (*)    GFR calc Af Amer 66 (*)    All other components within normal limits  CBC WITH DIFFERENTIAL - Abnormal; Notable for the following:    HCT 37.7 (*)    Platelets 111 (*)    All other components within normal limits   Imaging Review No results found.   EKG Interpretation None      MDM   Final diagnoses:  Leishmaniasis, mucocutaneous  Medication therapy continued   Patient afebrile with Normal VS.  CMP and CBC appear stable with mild increase in ALT.  Patient received Ambisome infusion with pretreatment with Benadryl, Tylenol, Demerol, and Zofran. Patient did not have any significant reactions to infusion. Plan to have patient return tomorrow for next serial infusion.   Meds given in ED:  Medications  sodium chloride 0.9 % bolus 500 mL (0 mLs Intravenous Stopped 04/19/13 1128)  acetaminophen (TYLENOL) tablet 650 mg (650 mg Oral Given 04/19/13 1037)  meperidine (DEMEROL) injection 25 mg (25 mg Intravenous Given 04/19/13 1315)  diphenhydrAMINE (BENADRYL) injection 50 mg (50 mg Intravenous Given 04/19/13 1311)  ondansetron (ZOFRAN) injection 4 mg (4 mg Intravenous Given 04/19/13 1308)  amphotericin B liposome (AMBISOME) 290 mg in dextrose 5 % 500 mL IVPB (290 mg Intravenous Given 04/19/13 1352)  acetaminophen (TYLENOL) tablet 500 mg (500 mg Oral Given 04/19/13 1322)    Discharge Medication List as of 04/19/2013  7:22 PM        Sherrie George, PA-C 04/20/13 2121

## 2013-04-19 NOTE — Discharge Instructions (Signed)
Return to Emergency Department tomorrow for repeat IV infusion treatment. Return to sooner if you develop any adverse reactions to medication.

## 2013-04-19 NOTE — ED Notes (Signed)
Pt reports the routine for the amphotericin infusion begins with IVF for about an hour, then pre medicate with benedryl then demerol about 30 mins prior to infusion. Then after infusion pt is given more IVF for about an hour then he is discharged home. Per pharmacy prep time for mixing amphotericin is about 30 mins to 1 hour.

## 2013-04-20 ENCOUNTER — Encounter (HOSPITAL_COMMUNITY): Payer: Self-pay | Admitting: Emergency Medicine

## 2013-04-20 ENCOUNTER — Telehealth: Payer: Self-pay | Admitting: Family

## 2013-04-20 ENCOUNTER — Emergency Department (HOSPITAL_COMMUNITY)
Admission: EM | Admit: 2013-04-20 | Discharge: 2013-04-20 | Disposition: A | Discharge status: Home / Self Care | Payer: 59 | Attending: Emergency Medicine | Admitting: Emergency Medicine

## 2013-04-20 DIAGNOSIS — Z860100 Personal history of colon polyps, unspecified: Secondary | ICD-10-CM | POA: Insufficient documentation

## 2013-04-20 DIAGNOSIS — Z87891 Personal history of nicotine dependence: Secondary | ICD-10-CM | POA: Insufficient documentation

## 2013-04-20 DIAGNOSIS — Z79899 Other long term (current) drug therapy: Secondary | ICD-10-CM | POA: Insufficient documentation

## 2013-04-20 DIAGNOSIS — B552 Mucocutaneous leishmaniasis: Secondary | ICD-10-CM

## 2013-04-20 DIAGNOSIS — Z86711 Personal history of pulmonary embolism: Secondary | ICD-10-CM | POA: Insufficient documentation

## 2013-04-20 DIAGNOSIS — Z792 Long term (current) use of antibiotics: Secondary | ICD-10-CM | POA: Insufficient documentation

## 2013-04-20 DIAGNOSIS — Z7982 Long term (current) use of aspirin: Secondary | ICD-10-CM | POA: Insufficient documentation

## 2013-04-20 DIAGNOSIS — E785 Hyperlipidemia, unspecified: Secondary | ICD-10-CM | POA: Insufficient documentation

## 2013-04-20 MED ORDER — ONDANSETRON HCL 4 MG/2ML IJ SOLN
4.0000 mg | Freq: Once | INTRAMUSCULAR | Status: AC
  Administered 2013-04-20: 4 mg via INTRAVENOUS
  Filled 2013-04-20: qty 2

## 2013-04-20 MED ORDER — DEXTROSE 5 % IV SOLN
300.0000 mg | INTRAVENOUS | Status: DC
  Administered 2013-04-20: 300 mg via INTRAVENOUS
  Filled 2013-04-20: qty 300

## 2013-04-20 MED ORDER — SODIUM CHLORIDE 0.9 % IV BOLUS (SEPSIS)
1000.0000 mL | Freq: Once | INTRAVENOUS | Status: AC
  Administered 2013-04-20: 1000 mL via INTRAVENOUS

## 2013-04-20 MED ORDER — MEPERIDINE HCL 25 MG/ML IJ SOLN
25.0000 mg | Freq: Once | INTRAMUSCULAR | Status: AC
  Administered 2013-04-20: 25 mg via INTRAVENOUS
  Filled 2013-04-20: qty 1

## 2013-04-20 MED ORDER — ACETAMINOPHEN 325 MG PO TABS
650.0000 mg | ORAL_TABLET | Freq: Once | ORAL | Status: AC
  Administered 2013-04-20: 650 mg via ORAL
  Filled 2013-04-20: qty 2

## 2013-04-20 MED ORDER — DIPHENHYDRAMINE HCL 50 MG/ML IJ SOLN
50.0000 mg | Freq: Once | INTRAMUSCULAR | Status: AC
  Administered 2013-04-20: 50 mg via INTRAVENOUS
  Filled 2013-04-20: qty 1

## 2013-04-20 NOTE — Telephone Encounter (Signed)
Please call pt to arrange ed follow up.

## 2013-04-20 NOTE — ED Notes (Signed)
Patient stated is here for IV medication  Ambisone 300mg  IV daily.  Stated getting dose during the week in Short stay and in Ed on weekend.

## 2013-04-20 NOTE — ED Notes (Signed)
Pt pre-medicated prior to infusion. Vital signs remain stable. No signs of distress noted.

## 2013-04-20 NOTE — ED Notes (Addendum)
Pt resting quietly at the time. No signs of distress noted. Vital signs stable. Wife at bedside. EDP also at bedside speaking with patient and family. Pt to receive IV infusion in ED. Orders to be placed by EDP.

## 2013-04-20 NOTE — ED Notes (Signed)
Infusion continues without any difficulty. Pt resting quietly at the time. Vital signs stable. Wife at bedside. Pt denies pain. He remains alert and oriented x4.

## 2013-04-20 NOTE — Discharge Instructions (Signed)
Leishmaniasis Leishmaniasis is a parasitic disease. It is spread by the bite of infected sand flies. There are several different forms of leishmaniasis. The most common forms are cutaneous leishmaniasis, which causes skin sores, and visceral leishmaniasis, which affects some of the internal organs of the body (for example, spleen, liver, and bone marrow). Leishmaniasis is spread by the bite of some types of phlebotomine (blood sucking) sand flies. Sand flies become infected by biting an infected animal or person. Since sand flies do not make noise when they fly, people may not realize they are present. Sand flies are very small and may be hard to see. They are only about one-third the size of typical mosquitoes. Sand flies usually are most active in twilight, evening, and night-time hours. Sand flies are less active during the hottest time of the day. However, they will bite if they are disturbed, such as when a person brushes up against the trunk of a tree where sand flies are resting. Rarely, leishmaniasis is spread from a pregnant woman to her baby. Leishmaniasis also can be spread by blood transfusions or by contaminated needles. If not treated, visceral leishmaniasis can cause death.  SIGNS AND SYMPTOMS OF CUTANEOUS LEISHMANIASIS People who have cutaneous leishmaniasis have one or more sores on their skin. These develop within a few weeks (sometimes as long as months) from when they were bitten. If untreated, the sores can last from weeks to years. The sores can change in size and appearance over time. They often end up looking somewhat like a volcano, with a raised edge and central crater. Some sores are covered by a scab. The sores can be painless or painful. Some people have swollen glands near the sores (for example, under the arm if the sores are on the arm or hand). SIGNS AND SYMPTOMS OF VISCERAL LEISHMANIASIS People with visceral leishmaniasis usually become sick within several months of when they  were bitten. People who have visceral leishmaniasis usually have fever, weight loss, and an enlarged spleen and liver. Some patients have swollen glands. Certain blood tests are abnormal. For example, patients usually have low blood counts, including a low red blood cell count (anemia), low white blood cell count, and low platelet count.  WHO IS AT RISK FOR LEISHMANIASIS? People of all ages are at risk for leishmaniasis if they live or travel where leishmaniasis is found. Leishmaniasis usually is more common in rural than urban areas, but it is found in the outskirts of some cities. The risk for leishmaniasis is highest from dusk to dawn. This is when sand flies are the most active. All it takes to get infected is to be bitten by one infected sand fly. Adventure travelers, Veterinary surgeon, missionaries, Doctor, hospital (people who study birds), other people who do research outdoors at night, and soldiers are examples of people who may have an increased risk for leishmaniasis, especially cutaneous leishmaniasis.  CAN LEISHMANIASIS BE A SERIOUS DISEASE IF NOT TREATED? Yes. The skin sores of cutaneous leishmaniasis will heal on their own, but this can take months or even years. The sores can leave ugly scars. If not treated, infection that started in the skin could spread to the nose or mouth and cause sores there (mucosal leishmaniasis). This can happen with some of the types of the parasite found in [place] and [place]. Mucosal leishmaniasis might not be noticed until years after the original skin sores healed. The best way to prevent mucosal leishmaniasis is to treat the cutaneous infection before it spreads.  If not treated, visceral leishmaniasis can cause death.  WHAT SHOULD I DO IF I THINK I MIGHT HAVE LEISHMANIASIS? See your caregiver if you have traveled to an area where leishmaniasis is found and you have developed skin sores that are not healing. Tell your caregiver where you have traveled  and that you might be at risk for leishmaniasis. It is very rare for travelers to get visceral leishmaniasis.  HOW WILL MY CAREGIVER KNOW IF I HAVE LEISHMANIASIS? The first step is to find out if you have traveled to a part of the world where leishmaniasis is found. Your caregiver will ask you about any signs or symptoms of leishmaniasis you may have, such as skin sores that have not healed. If you have skin sores, your caregiver will likely want to take some samples directly from the sores. These samples can be examined for the parasite under a microscope, in cultures, and through other means. A blood test for detecting antibody (immune response) to the parasite can be helpful, particularly for cases of visceral leishmaniasis. However, tests to look for the parasite itself should also be done. CDC staff can help with the laboratory testing. Diagnosing leishmaniasis can be difficult. Sometimes the laboratory tests are negative even if a person has leishmaniasis. TREATMENT  Your caregiver can talk with CDC staff about whether your case of leishmaniasis should be treated. Most people who have cutaneous leishmaniasis do not need to be hospitalized during their treatment. PREVENTION  The best way for travelers to prevent leishmaniasis is by protecting themselves from sand fly bites. Vaccines and drugs for preventing infection are not yet available. Although sand flies are primarily nighttime biters, infection can be acquired during the daytime if resting sand flies are disturbed. Sand fly activity in an area can easily be underestimated because sand flies are noiseless fliers and rare bites might not be noticed. To decrease the risk of being bitten, travelers should:   Stay in well-screened or air-conditioned areas as much as possible.  Avoid outdoor activities, especially from dusk to dawn, when sand flies are the most active.  Wear long-sleeved shirts, long pants, and socks when outside. Tuck your shirt  into your pants.  Apply insect repellent on uncovered skin and under the ends of sleeves and pant legs. Follow the instructions on the label of the repellent. The most effective repellants are those that contain the chemical DEET (N,N-diethylmetatoluamide). The concentration of DEET varies among repellents. Repellents with DEET concentrations of 30-35% are quite effective. The effect should last about 4 hours. Lower concentrations should be used for children (no more than 10% DEET). Repellants with DEET should be used sparingly on children from 16 to 94 years old. Do not use at all on children less than 74 years old.  Spray clothing with permethrin-containing insecticides. The insecticide should be reapplied after every five washings.  Spray living and sleeping areas with an insecticide to kill insects.  If you are not sleeping in an area that is well screened or air-conditioned, use a bed net and tuck it under your mattress. If possible, use a bed net that has been soaked in or sprayed with permethrin. The permethrin will be effective for several months if the bed net is not washed. Keep in mind that sand flies are much smaller than mosquitoes and can get through smaller holes. Fine-mesh netting (at least 18 holes to the inch) is needed for an effective barrier against sand flies. This is particularly important if the bed net has  not been treated with permethrin. However, it may be uncomfortable to sleep under such a closely woven bed net when it is hot. NOTE: Bed nets, repellants containing DEET, and permethrin should be purchased before traveling and can be found in hardware, camping, and [employer] surplus stores. IF I HAVE ALREADY HAD LEISHMANIASIS, COULD I GET IT AGAIN? Yes. Some people have had cutaneous leishmaniasis more than once. Therefore, you should follow the preventive measures listed above whenever you are in an area where leishmaniasis is found.  Document Released: 12/14/2003 Document  Revised: 04/10/2011 Document Reviewed: 04/26/2005 [facility] Patient Information 2014 [place].  RETURN IMMEDIATELY IF you develop new shortness of breath, chest pain, fever, have difficulty moving parts of your body (new weakness, numbness, or incoordination), sudden change in speech, vision, swallowing, or understanding, faint or develop new dizziness, severe headache, become poorly responsive or have an altered mental status compared to baseline for you, new rash, abdominal pain, or bloody stools,  Return sooner also if you develop new problems for which you have not talked to your caregiver but you feel may be emergency medical conditions, or are unable to be cared for safely at home.

## 2013-04-20 NOTE — ED Provider Notes (Signed)
CSN: [id number]     Arrival date & time 04/20/13  1751 History   First MD Initiated Contact with Patient 04/20/13 [phone]     Chief Complaint  Patient presents with  . IV Medication     (Consider location/radiation/quality/duration/timing/severity/associated sxs/prior Treatment) HPI 58 year old male with history of mucocutaneous Leishmaniasis here for scheduled IV amphotericin infusion which he is receiving daily over the course of 3 weeks tolerating the medicine well so far with premedications and IV fluid bolus before and after amphotericin infusion. He feels well with no complaints he is no fever chest pain cough shortness breath abdominal pain vomiting or rashes confusion hallucinations or other concerns. Past Medical History  Diagnosis Date  . History of chicken pox     childhood  . Hyperlipidemia   . Colon polyps     2001, 2007  . Pulmonary embolus   . Leishmaniasis 05-2011   Past Surgical History  Procedure Laterality Date  . Tendon repair  2005    right peroneal tendon-[facility]  . Rotator cuff repair  2007    left rotator cuff-[facility]  . Cataract extraction, bilateral  2009    [facility]   Family History  Problem Relation Age of Onset  . Prostate cancer Father     alive  . Hyperlipidemia Mother   . Hypertension Mother   . Diabetes Mother   . Stroke Paternal Grandfather   . Heart attack Neg Hx   . Sudden death Neg Hx   . Clotting disorder Father     blood clots  . Stroke Maternal Grandfather    History  Substance Use Topics  . Smoking status: Former Research scientist (life sciences)  . Smokeless tobacco: Never Used     Comment: quit 1984 1/2 ppd for 4 years  . Alcohol Use: No    Review of Systems 10 Systems reviewed and are negative for acute change except as noted in the HPI.   Allergies  Review of patient's allergies indicates no known allergies.  Home Medications   Current Outpatient Rx  Name  Route  Sig  Dispense  Refill  .  aspirin 81 MG tablet   Oral   Take 162 mg by mouth at bedtime.          . mupirocin ointment (BACTROBAN) 2 %   Nasal   Place 1 application into the nose 2 (two) times daily.         . ondansetron (ZOFRAN) 8 MG tablet   Oral   Take 1 tablet (8 mg total) by mouth every 8 (eight) hours as needed for nausea or vomiting.   30 tablet   0   . pravastatin (PRAVACHOL) 40 MG tablet   Oral   Take 40 mg by mouth daily.          BP 124/71  Pulse 66  Temp(Src) 98.3 F (36.8 C) (Oral)  Resp 16  SpO2 97% Physical Exam  Nursing note and vitals reviewed. Constitutional:  Awake, alert, nontoxic appearance.  HENT:  Head: Atraumatic.  Eyes: Right eye exhibits no discharge. Left eye exhibits no discharge.  Neck: Neck supple.  Cardiovascular: Normal rate and regular rhythm.   No murmur heard. Pulmonary/Chest: Effort normal and breath sounds normal. No respiratory distress. He has no wheezes. He has no rales. He exhibits no tenderness.  Abdominal: Soft. There is no tenderness. There is no rebound.  Musculoskeletal: He exhibits no tenderness.  Baseline ROM, no obvious new focal weakness.  Neurological: He is alert.  Mental status and motor strength appears baseline for patient and situation.  Skin: No rash noted.  Psychiatric: He has a normal mood and affect.    ED Course  Procedures (including critical care time) Previous orders will be followed the patient receives amphotericin 300 mg IV once daily, premedicated with Tylenol 650 mg orally, premedicated also with 50 mg Benadryl IV and 25 mg Demerol IV as well as Zofran 4 mg IV with IV normal saline bolus 500 mL's both prior to, and after infusion of amphotericin.  Pt stable in ED with no significant deterioration in condition.Patient / Family / Caregiver informed of clinical course, understand medical decision-making process, and agree with plan. Labs Review Labs Reviewed - No data to display Imaging Review No results found.    EKG Interpretation None      MDM   Final diagnoses:  Leishmaniasis, mucocutaneous    I doubt any other EMC precluding discharge at this time including, but not necessarily limited to the following:sepsis.    Babette Relic, MD 04/21/13 2126

## 2013-04-21 ENCOUNTER — Encounter (HOSPITAL_COMMUNITY)
Admission: RE | Admit: 2013-04-21 | Discharge: 2013-04-21 | Disposition: A | Discharge status: Home / Self Care | Payer: 59 | Source: Ambulatory Visit | Attending: Internal Medicine | Admitting: Internal Medicine

## 2013-04-21 ENCOUNTER — Telehealth: Payer: Self-pay | Admitting: *Deleted

## 2013-04-21 DIAGNOSIS — Z79899 Other long term (current) drug therapy: Secondary | ICD-10-CM

## 2013-04-21 MED ORDER — SODIUM CHLORIDE 0.9 % IV SOLN
Freq: Two times a day (BID) | INTRAVENOUS | Status: DC | PRN
  Administered 2013-04-21: 500 mL/h via INTRAVENOUS

## 2013-04-21 MED ORDER — DEXTROSE 5 % IV SOLN
300.0000 mg | Freq: Every day | INTRAVENOUS | Status: DC
  Administered 2013-04-21: 300 mg via INTRAVENOUS
  Filled 2013-04-21: qty 300

## 2013-04-21 MED ORDER — MEPERIDINE HCL 25 MG/ML IJ SOLN
25.0000 mg | Freq: Every day | INTRAMUSCULAR | Status: DC
  Administered 2013-04-21: 25 mg via INTRAVENOUS
  Filled 2013-04-21: qty 1

## 2013-04-21 MED ORDER — HEPARIN SOD (PORK) LOCK FLUSH 100 UNIT/ML IV SOLN
250.0000 [IU] | INTRAVENOUS | Status: DC | PRN
  Administered 2013-04-21: 250 [IU]

## 2013-04-21 MED ORDER — ACETAMINOPHEN 325 MG PO TABS
ORAL_TABLET | ORAL | Status: AC
  Administered 2013-04-21: 650 mg
  Filled 2013-04-21: qty 2

## 2013-04-21 MED ORDER — HEPARIN SOD (PORK) LOCK FLUSH 100 UNIT/ML IV SOLN
INTRAVENOUS | Status: AC
  Administered 2013-04-21: 250 [IU]
  Filled 2013-04-21: qty 5

## 2013-04-21 MED ORDER — DIPHENHYDRAMINE HCL 50 MG/ML IJ SOLN
INTRAMUSCULAR | Status: AC
  Administered 2013-04-21: 50 mg
  Filled 2013-04-21: qty 1

## 2013-04-21 MED ORDER — DIPHENHYDRAMINE HCL 50 MG/ML IJ SOLN: 50.0000 mg | Freq: Every day | INTRAMUSCULAR | Status: DC

## 2013-04-21 MED ORDER — DEXTROSE 5 % IV SOLN
Freq: Every day | INTRAVENOUS | Status: DC
  Administered 2013-04-21: 13:00:00 via INTRAVENOUS

## 2013-04-21 MED ORDER — ACETAMINOPHEN 325 MG PO TABS: 650.0000 mg | ORAL_TABLET | Freq: Every day | ORAL | Status: DC

## 2013-04-21 MED ORDER — HEPARIN SOD (PORK) LOCK FLUSH 100 UNIT/ML IV SOLN: 250.0000 [IU] | Freq: Every day | INTRAVENOUS | Status: DC

## 2013-04-21 NOTE — Telephone Encounter (Signed)
Message copied by Landis Gandy on Mon Apr 21, 2013  3:31 PM ------      Message from: Thayer Headings      Created: Sun Apr 20, 2013  6:22 PM       Can you have short stay at [facility], who is giving him his amphtericin, do a CBC and CMP on about Tuesday?  Thanks ------

## 2013-04-21 NOTE — ED Provider Notes (Signed)
Medical screening examination/treatment/procedure(s) were performed by non-physician practitioner and as supervising physician I was immediately available for consultation/collaboration.   Houston Siren III, MD 04/21/13 [phone]

## 2013-04-21 NOTE — Telephone Encounter (Signed)
Left detailed message informing patient to call our office to schedule ED follow up.

## 2013-04-21 NOTE — Telephone Encounter (Signed)
Entered orders in EPIC per Dr. Linus Salmons.  Spoke with Short Stay B to make them aware for when the patient arrives tomorrow for his infusion.   Landis Gandy, RN

## 2013-04-21 NOTE — Progress Notes (Signed)
PICC line dressing changed per protocol. Site unremarkable. Clamp closed. End cap changed.

## 2013-04-22 ENCOUNTER — Encounter (HOSPITAL_COMMUNITY)
Admission: RE | Admit: 2013-04-22 | Discharge: 2013-04-22 | Disposition: A | Discharge status: Home / Self Care | Payer: 59 | Source: Ambulatory Visit | Attending: Internal Medicine | Admitting: Internal Medicine

## 2013-04-22 DIAGNOSIS — B552 Mucocutaneous leishmaniasis: Secondary | ICD-10-CM | POA: Insufficient documentation

## 2013-04-22 LAB — COMPREHENSIVE METABOLIC PANEL
ALBUMIN: 3.3 g/dL — AB (ref 3.5–5.2)
ALT: 36 U/L (ref 0–53)
AST: 18 U/L (ref 0–37)
Alkaline Phosphatase: 76 U/L (ref 39–117)
BILIRUBIN TOTAL: 0.4 mg/dL (ref 0.3–1.2)
BUN: 32 mg/dL — ABNORMAL HIGH (ref 6–23)
CHLORIDE: 107 meq/L (ref 96–112)
CO2: 23 meq/L (ref 19–32)
Calcium: 8.6 mg/dL (ref 8.4–10.5)
Creatinine, Ser: 1.34 mg/dL (ref 0.50–1.35)
GFR calc Af Amer: 65 mL/min — ABNORMAL LOW (ref >90)
GFR, EST NON AFRICAN AMERICAN: 56 mL/min — AB (ref >90)
Glucose, Bld: 155 mg/dL — ABNORMAL HIGH (ref 70–99)
Potassium: 2.8 mEq/L — CL (ref 3.7–5.3)
Sodium: 145 mEq/L (ref 137–147)
Total Protein: 5.9 g/dL — ABNORMAL LOW (ref 6.0–8.3)

## 2013-04-22 LAB — CBC
HCT: 34.5 % — ABNORMAL LOW (ref 39.0–52.0)
Hemoglobin: 12.5 g/dL — ABNORMAL LOW (ref 13.0–17.0)
MCH: 29.6 pg (ref 26.0–34.0)
MCHC: 36.2 g/dL — AB (ref 30.0–36.0)
MCV: 81.6 fL (ref 78.0–100.0)
PLATELETS: 99 10*3/uL — AB (ref 150–400)
RBC: 4.23 MIL/uL (ref 4.22–5.81)
RDW: 13.4 % (ref 11.5–15.5)
WBC: 5.7 10*3/uL (ref 4.0–10.5)

## 2013-04-22 MED ORDER — DEXTROSE 5 % IV SOLN: Freq: Every day | INTRAVENOUS | Status: DC

## 2013-04-22 MED ORDER — HEPARIN SOD (PORK) LOCK FLUSH 100 UNIT/ML IV SOLN: 250.0000 [IU] | Freq: Every day | INTRAVENOUS | Status: DC

## 2013-04-22 MED ORDER — ACETAMINOPHEN 325 MG PO TABS
650.0000 mg | ORAL_TABLET | Freq: Every day | ORAL | Status: DC
  Administered 2013-04-22: 650 mg via ORAL

## 2013-04-22 MED ORDER — DEXTROSE 5 % IV SOLN
300.0000 mg | Freq: Every day | INTRAVENOUS | Status: DC
  Administered 2013-04-22: 300 mg via INTRAVENOUS
  Filled 2013-04-22: qty 300

## 2013-04-22 MED ORDER — HEPARIN SOD (PORK) LOCK FLUSH 100 UNIT/ML IV SOLN
250.0000 [IU] | INTRAVENOUS | Status: DC | PRN
  Administered 2013-04-22: 250 [IU]

## 2013-04-22 MED ORDER — DIPHENHYDRAMINE HCL 50 MG/ML IJ SOLN
INTRAMUSCULAR | Status: AC
  Filled 2013-04-22: qty 1

## 2013-04-22 MED ORDER — SODIUM CHLORIDE 0.9 % IV SOLN
Freq: Two times a day (BID) | INTRAVENOUS | Status: DC | PRN
  Administered 2013-04-22 (×2): 500 mL/h via INTRAVENOUS

## 2013-04-22 MED ORDER — ACETAMINOPHEN 325 MG PO TABS
ORAL_TABLET | ORAL | Status: AC
Start: 2013-04-22 — End: 2013-04-22
  Filled 2013-04-22: qty 2

## 2013-04-22 MED ORDER — POTASSIUM CHLORIDE CRYS ER 20 MEQ PO TBCR
40.0000 meq | EXTENDED_RELEASE_TABLET | Freq: Once | ORAL | Status: AC
Start: 2013-04-22 — End: 2013-04-22
  Administered 2013-04-22: 40 meq via ORAL
  Filled 2013-04-22: qty 2

## 2013-04-22 MED ORDER — MEPERIDINE HCL 25 MG/ML IJ SOLN
25.0000 mg | Freq: Every day | INTRAMUSCULAR | Status: DC
  Administered 2013-04-22: 25 mg via INTRAVENOUS

## 2013-04-22 MED ORDER — HEPARIN SOD (PORK) LOCK FLUSH 100 UNIT/ML IV SOLN
INTRAVENOUS | Status: AC
  Administered 2013-04-22: 250 [IU]
  Filled 2013-04-22: qty 5

## 2013-04-22 MED ORDER — MEPERIDINE HCL 25 MG/ML IJ SOLN
INTRAMUSCULAR | Status: AC
  Administered 2013-04-22: 25 mg via INTRAVENOUS
  Filled 2013-04-22: qty 1

## 2013-04-22 MED ORDER — DIPHENHYDRAMINE HCL 50 MG/ML IJ SOLN
50.0000 mg | Freq: Every day | INTRAMUSCULAR | Status: DC
  Administered 2013-04-22: 50 mg via INTRAVENOUS

## 2013-04-22 NOTE — Telephone Encounter (Signed)
Left message for patient to return my call.

## 2013-04-23 ENCOUNTER — Encounter (HOSPITAL_COMMUNITY)
Admission: RE | Admit: 2013-04-23 | Discharge: 2013-04-23 | Disposition: A | Discharge status: Home / Self Care | Payer: 59 | Source: Ambulatory Visit | Attending: Internal Medicine | Admitting: Internal Medicine

## 2013-04-23 ENCOUNTER — Telehealth: Payer: Self-pay | Admitting: *Deleted

## 2013-04-23 MED ORDER — ACETAMINOPHEN 325 MG PO TABS
650.0000 mg | ORAL_TABLET | Freq: Every day | ORAL | Status: DC
  Administered 2013-04-23: 650 mg via ORAL

## 2013-04-23 MED ORDER — MEPERIDINE HCL 25 MG/ML IJ SOLN
INTRAMUSCULAR | Status: AC
  Administered 2013-04-23: 25 mg
  Filled 2013-04-23: qty 1

## 2013-04-23 MED ORDER — HEPARIN SOD (PORK) LOCK FLUSH 100 UNIT/ML IV SOLN: 250.0000 [IU] | INTRAVENOUS | Status: DC | PRN

## 2013-04-23 MED ORDER — SODIUM CHLORIDE 0.9 % IV SOLN
Freq: Two times a day (BID) | INTRAVENOUS | Status: DC | PRN
  Administered 2013-04-23 (×2): 500 mL/h via INTRAVENOUS

## 2013-04-23 MED ORDER — MEPERIDINE HCL 25 MG/ML IJ SOLN: 25.0000 mg | Freq: Every day | INTRAMUSCULAR | Status: DC

## 2013-04-23 MED ORDER — ACETAMINOPHEN 325 MG PO TABS
ORAL_TABLET | ORAL | Status: AC
  Filled 2013-04-23: qty 2

## 2013-04-23 MED ORDER — DEXTROSE 5 % IV SOLN
300.0000 mg | Freq: Every day | INTRAVENOUS | Status: DC
  Administered 2013-04-23: 300 mg via INTRAVENOUS
  Filled 2013-04-23: qty 300

## 2013-04-23 MED ORDER — DIPHENHYDRAMINE HCL 50 MG/ML IJ SOLN: 50.0000 mg | Freq: Every day | INTRAMUSCULAR | Status: DC

## 2013-04-23 MED ORDER — DIPHENHYDRAMINE HCL 50 MG/ML IJ SOLN
INTRAMUSCULAR | Status: AC
  Administered 2013-04-23: 50 mg
  Filled 2013-04-23: qty 1

## 2013-04-23 MED ORDER — HEPARIN SOD (PORK) LOCK FLUSH 100 UNIT/ML IV SOLN
INTRAVENOUS | Status: AC
  Filled 2013-04-23: qty 5

## 2013-04-23 MED ORDER — DEXTROSE 5 % IV SOLN
Freq: Every day | INTRAVENOUS | Status: DC
  Administered 2013-04-23: 10:00:00 via INTRAVENOUS

## 2013-04-23 MED ORDER — HEPARIN SOD (PORK) LOCK FLUSH 100 UNIT/ML IV SOLN
250.0000 [IU] | Freq: Every day | INTRAVENOUS | Status: DC
  Administered 2013-04-23: 250 [IU]

## 2013-04-23 NOTE — Telephone Encounter (Signed)
Dr. Jenell Milliner' office given Dr. Henreitta Leber cell phone to call regarding Dr. Henreitta Leber need for obtaining nasal sample for lab.

## 2013-04-24 ENCOUNTER — Encounter (HOSPITAL_COMMUNITY)
Admission: RE | Admit: 2013-04-24 | Discharge: 2013-04-24 | Disposition: A | Discharge status: Home / Self Care | Payer: 59 | Source: Ambulatory Visit | Attending: Internal Medicine | Admitting: Internal Medicine

## 2013-04-24 LAB — POTASSIUM: POTASSIUM: 2.6 meq/L — AB (ref 3.7–5.3)

## 2013-04-24 MED ORDER — POTASSIUM CHLORIDE CRYS ER 20 MEQ PO TBCR
EXTENDED_RELEASE_TABLET | ORAL | Status: AC
  Filled 2013-04-24: qty 2

## 2013-04-24 MED ORDER — HEPARIN SOD (PORK) LOCK FLUSH 100 UNIT/ML IV SOLN
INTRAVENOUS | Status: AC
  Filled 2013-04-24: qty 5

## 2013-04-24 MED ORDER — DIPHENHYDRAMINE HCL 50 MG/ML IJ SOLN
50.0000 mg | Freq: Every day | INTRAMUSCULAR | Status: DC
  Administered 2013-04-24: 50 mg via INTRAVENOUS

## 2013-04-24 MED ORDER — DEXTROSE 5 % IV SOLN
Freq: Every day | INTRAVENOUS | Status: DC
  Administered 2013-04-24: 10:00:00 via INTRAVENOUS

## 2013-04-24 MED ORDER — ACETAMINOPHEN 325 MG PO TABS
650.0000 mg | ORAL_TABLET | Freq: Every day | ORAL | Status: DC
  Administered 2013-04-24: 650 mg via ORAL

## 2013-04-24 MED ORDER — SODIUM CHLORIDE 0.9 % IV SOLN
Freq: Two times a day (BID) | INTRAVENOUS | Status: DC | PRN
  Administered 2013-04-24: 500 mL/h via INTRAVENOUS

## 2013-04-24 MED ORDER — AMPHOTERICIN B LIPOSOME 50 MG IV SUSR
300.0000 mg | Freq: Every day | INTRAVENOUS | Status: DC
  Administered 2013-04-24: 300 mg via INTRAVENOUS
  Filled 2013-04-24: qty 300

## 2013-04-24 MED ORDER — ACETAMINOPHEN 325 MG PO TABS
ORAL_TABLET | ORAL | Status: AC
  Administered 2013-04-24: 650 mg via ORAL
  Filled 2013-04-24: qty 2

## 2013-04-24 MED ORDER — POTASSIUM CHLORIDE CRYS ER 20 MEQ PO TBCR
EXTENDED_RELEASE_TABLET | ORAL | Status: AC
  Filled 2013-04-24: qty 1

## 2013-04-24 MED ORDER — MEPERIDINE HCL 25 MG/ML IJ SOLN
INTRAMUSCULAR | Status: AC
  Administered 2013-04-24: 25 mg via INTRAVENOUS
  Filled 2013-04-24: qty 1

## 2013-04-24 MED ORDER — POTASSIUM CHLORIDE CRYS ER 20 MEQ PO TBCR
40.0000 meq | EXTENDED_RELEASE_TABLET | Freq: Once | ORAL | Status: AC
  Administered 2013-04-24: 40 meq via ORAL

## 2013-04-24 MED ORDER — HEPARIN SOD (PORK) LOCK FLUSH 100 UNIT/ML IV SOLN
250.0000 [IU] | Freq: Every day | INTRAVENOUS | Status: DC
  Administered 2013-04-24: 15:00:00

## 2013-04-24 MED ORDER — HEPARIN SOD (PORK) LOCK FLUSH 100 UNIT/ML IV SOLN: 250.0000 [IU] | INTRAVENOUS | Status: DC | PRN

## 2013-04-24 MED ORDER — MEPERIDINE HCL 25 MG/ML IJ SOLN
25.0000 mg | Freq: Every day | INTRAMUSCULAR | Status: DC
  Administered 2013-04-24: 25 mg via INTRAVENOUS

## 2013-04-24 MED ORDER — DIPHENHYDRAMINE HCL 50 MG/ML IJ SOLN
INTRAMUSCULAR | Status: AC
  Administered 2013-04-24: 50 mg via INTRAVENOUS
  Filled 2013-04-24: qty 1

## 2013-04-24 MED ORDER — POTASSIUM CHLORIDE CRYS ER 20 MEQ PO TBCR
40.0000 meq | EXTENDED_RELEASE_TABLET | Freq: Two times a day (BID) | ORAL | Status: DC
  Administered 2013-04-24: 40 meq via ORAL

## 2013-04-24 NOTE — Progress Notes (Signed)
Lab called with critical potassium value for pt.  The potassium level is 2.6  MD called and orders were given for 40 meq of potassium now and repeat dose in 2 hours.  Will continue to monitor closely

## 2013-04-25 ENCOUNTER — Ambulatory Visit: Payer: Self-pay | Admitting: Hematology & Oncology

## 2013-04-25 ENCOUNTER — Other Ambulatory Visit: Payer: Self-pay | Admitting: Lab

## 2013-04-25 ENCOUNTER — Encounter (HOSPITAL_COMMUNITY)
Admission: RE | Admit: 2013-04-25 | Discharge: 2013-04-25 | Disposition: A | Discharge status: Home / Self Care | Payer: 59 | Source: Ambulatory Visit | Attending: Internal Medicine | Admitting: Internal Medicine

## 2013-04-25 MED ORDER — SODIUM CHLORIDE 0.9 % IV SOLN
Freq: Two times a day (BID) | INTRAVENOUS | Status: DC | PRN
  Administered 2013-04-25: 09:00:00 via INTRAVENOUS

## 2013-04-25 MED ORDER — HEPARIN SOD (PORK) LOCK FLUSH 100 UNIT/ML IV SOLN
INTRAVENOUS | Status: AC
  Administered 2013-04-25: 250 [IU]
  Filled 2013-04-25: qty 5

## 2013-04-25 MED ORDER — DIPHENHYDRAMINE HCL 50 MG/ML IJ SOLN: 50.0000 mg | Freq: Every day | INTRAMUSCULAR | Status: DC

## 2013-04-25 MED ORDER — DEXTROSE 5 % IV SOLN
300.0000 mg | Freq: Every day | INTRAVENOUS | Status: DC
  Administered 2013-04-25: 300 mg via INTRAVENOUS
  Filled 2013-04-25: qty 300

## 2013-04-25 MED ORDER — ACETAMINOPHEN 325 MG PO TABS
ORAL_TABLET | ORAL | Status: AC
  Administered 2013-04-25: 650 mg
  Filled 2013-04-25: qty 2

## 2013-04-25 MED ORDER — DEXTROSE 5 % IV SOLN
Freq: Every day | INTRAVENOUS | Status: DC
  Administered 2013-04-25: 11:00:00 via INTRAVENOUS

## 2013-04-25 MED ORDER — ACETAMINOPHEN 325 MG PO TABS: 650.0000 mg | ORAL_TABLET | Freq: Every day | ORAL | Status: DC

## 2013-04-25 MED ORDER — HEPARIN SOD (PORK) LOCK FLUSH 100 UNIT/ML IV SOLN: 250.0000 [IU] | Freq: Every day | INTRAVENOUS | Status: DC

## 2013-04-25 MED ORDER — MEPERIDINE HCL 25 MG/ML IJ SOLN: 25.0000 mg | Freq: Every day | INTRAMUSCULAR | Status: DC

## 2013-04-25 MED ORDER — HEPARIN SOD (PORK) LOCK FLUSH 100 UNIT/ML IV SOLN: 250.0000 [IU] | INTRAVENOUS | Status: DC | PRN

## 2013-04-25 MED ORDER — MEPERIDINE HCL 25 MG/ML IJ SOLN
INTRAMUSCULAR | Status: AC
  Administered 2013-04-25: 25 mg
  Filled 2013-04-25: qty 1

## 2013-04-25 MED ORDER — DIPHENHYDRAMINE HCL 50 MG/ML IJ SOLN
INTRAMUSCULAR | Status: AC
  Administered 2013-04-25: 50 mg
  Filled 2013-04-25: qty 1

## 2013-04-25 NOTE — Telephone Encounter (Signed)
Mailed letter °

## 2013-04-25 NOTE — Telephone Encounter (Signed)
OK to mail letter

## 2013-04-25 NOTE — Telephone Encounter (Signed)
Left message for patient to return my call.

## 2013-04-26 ENCOUNTER — Encounter (HOSPITAL_COMMUNITY): Payer: Self-pay | Admitting: Emergency Medicine

## 2013-04-26 ENCOUNTER — Emergency Department (HOSPITAL_COMMUNITY)
Admission: EM | Admit: 2013-04-26 | Discharge: 2013-04-26 | Disposition: A | Discharge status: Home / Self Care | Payer: 59 | Attending: Emergency Medicine | Admitting: Emergency Medicine

## 2013-04-26 DIAGNOSIS — Z86711 Personal history of pulmonary embolism: Secondary | ICD-10-CM | POA: Insufficient documentation

## 2013-04-26 DIAGNOSIS — Z87891 Personal history of nicotine dependence: Secondary | ICD-10-CM | POA: Insufficient documentation

## 2013-04-26 DIAGNOSIS — Z79899 Other long term (current) drug therapy: Secondary | ICD-10-CM | POA: Insufficient documentation

## 2013-04-26 DIAGNOSIS — Z860100 Personal history of colon polyps, unspecified: Secondary | ICD-10-CM | POA: Insufficient documentation

## 2013-04-26 DIAGNOSIS — B559 Leishmaniasis, unspecified: Secondary | ICD-10-CM | POA: Insufficient documentation

## 2013-04-26 DIAGNOSIS — Z7982 Long term (current) use of aspirin: Secondary | ICD-10-CM | POA: Insufficient documentation

## 2013-04-26 DIAGNOSIS — E785 Hyperlipidemia, unspecified: Secondary | ICD-10-CM | POA: Insufficient documentation

## 2013-04-26 MED ORDER — MEPERIDINE HCL 25 MG/ML IJ SOLN
25.0000 mg | Freq: Once | INTRAMUSCULAR | Status: AC
  Administered 2013-04-26: 25 mg via INTRAVENOUS
  Filled 2013-04-26: qty 1

## 2013-04-26 MED ORDER — KETOROLAC TROMETHAMINE 30 MG/ML IJ SOLN: 30.0000 mg | Freq: Once | INTRAMUSCULAR | Status: DC

## 2013-04-26 MED ORDER — SODIUM CHLORIDE 0.9 % IV BOLUS (SEPSIS): 1000.0000 mL | Freq: Once | INTRAVENOUS | Status: DC

## 2013-04-26 MED ORDER — SODIUM CHLORIDE 0.9 % IV BOLUS (SEPSIS)
500.0000 mL | INTRAVENOUS | Status: AC
  Administered 2013-04-26: 500 mL via INTRAVENOUS

## 2013-04-26 MED ORDER — DIPHENHYDRAMINE HCL 50 MG/ML IJ SOLN
25.0000 mg | Freq: Once | INTRAMUSCULAR | Status: AC
  Administered 2013-04-26: 25 mg via INTRAVENOUS
  Filled 2013-04-26: qty 1

## 2013-04-26 MED ORDER — ONDANSETRON HCL 4 MG/2ML IJ SOLN
4.0000 mg | Freq: Once | INTRAMUSCULAR | Status: AC
  Administered 2013-04-26: 4 mg via INTRAVENOUS
  Filled 2013-04-26: qty 2

## 2013-04-26 MED ORDER — ACETAMINOPHEN 325 MG PO TABS
650.0000 mg | ORAL_TABLET | Freq: Once | ORAL | Status: AC
  Administered 2013-04-26: 650 mg via ORAL
  Filled 2013-04-26: qty 2

## 2013-04-26 MED ORDER — AMPHOTERICIN B 50 MG IJ SOLR
300.0000 mg | INTRAMUSCULAR | Status: DC
  Administered 2013-04-26: 300 mg via INTRAVENOUS
  Filled 2013-04-26 (×2): qty 300

## 2013-04-26 MED ORDER — DIPHENHYDRAMINE HCL 50 MG/ML IJ SOLN
50.0000 mg | Freq: Once | INTRAMUSCULAR | Status: AC
  Administered 2013-04-26: 50 mg via INTRAVENOUS
  Filled 2013-04-26: qty 1

## 2013-04-26 NOTE — ED Notes (Signed)
Dr.Lockwood at bedside  

## 2013-04-26 NOTE — ED Notes (Signed)
Patient trembling/shaking, is alert and oriented and c/o lower back pain, states he has had symptoms like this previously when he has had amphotericin B, informed Dr. Vanita Panda, orders received.  Will continue to assess

## 2013-04-26 NOTE — ED Provider Notes (Signed)
CSN: [id number]     Arrival date & time 04/26/13  5093 History   First MD Initiated Contact with Patient 04/26/13 [phone]     Chief Complaint  Patient presents with  . Infusion      HPI  Patient presents for infusion therapy. Patient is currently receiving infusion therapy for Leshmaniasis This is a 13 of a planned 21 day course of amphotericin. Since yesterday the patient has no new changes in his condition, including the new fever, syncope, chest pain, dyspnea. The patient typically receives therapy at outpatient center, but on weekends that is not available.   Past Medical History  Diagnosis Date  . History of chicken pox     childhood  . Hyperlipidemia   . Colon polyps     2001, 2007  . Pulmonary embolus   . Leishmaniasis 05-2011   Past Surgical History  Procedure Laterality Date  . Tendon repair  2005    right peroneal tendon-[facility]  . Rotator cuff repair  2007    left rotator cuff-[facility]  . Cataract extraction, bilateral  2009    [facility]   Family History  Problem Relation Age of Onset  . Prostate cancer Father     alive  . Hyperlipidemia Mother   . Hypertension Mother   . Diabetes Mother   . Stroke Paternal Grandfather   . Heart attack Neg Hx   . Sudden death Neg Hx   . Clotting disorder Father     blood clots  . Stroke Maternal Grandfather    History  Substance Use Topics  . Smoking status: Former Research scientist (life sciences)  . Smokeless tobacco: Never Used     Comment: quit 1984 1/2 ppd for 4 years  . Alcohol Use: No    Review of Systems  Constitutional:       Per HPI, otherwise negative  HENT:       Intranasal lesions are hardening  Respiratory:       Per HPI, otherwise negative  Cardiovascular:       Per HPI, otherwise negative  Gastrointestinal: Negative for vomiting.  Musculoskeletal:       Per HPI, otherwise negative  Skin:       No new wounds  Neurological: Negative for weakness.      Allergies   Review of patient's allergies indicates no known allergies.  Home Medications   Current Outpatient Rx  Name  Route  Sig  Dispense  Refill  . aspirin 81 MG tablet   Oral   Take 162 mg by mouth at bedtime.          . mupirocin ointment (BACTROBAN) 2 %   Nasal   Place 1 application into the nose 2 (two) times daily.         . ondansetron (ZOFRAN) 8 MG tablet   Oral   Take 1 tablet (8 mg total) by mouth every 8 (eight) hours as needed for nausea or vomiting.   30 tablet   0   . pravastatin (PRAVACHOL) 40 MG tablet   Oral   Take 40 mg by mouth daily.          BP 115/50  Pulse 52  Temp(Src) 97.5 F (36.4 C) (Oral)  SpO2 98% Physical Exam  Nursing note and vitals reviewed. Constitutional: He is oriented to person, place, and time.  Awake, alert, nontoxic appearance.  HENT:  Head: Atraumatic.  Eyes: Right eye exhibits no discharge. Left eye exhibits no discharge.  Pulmonary/Chest: Effort  normal. No respiratory distress.  Musculoskeletal: He exhibits no tenderness.  No gross lesions  Neurological: He is alert and oriented to person, place, and time.  Speech is clear, face is symmetric and gait in normal  Skin: No rash noted.  Psychiatric: He has a normal mood and affect.    ED Course  Procedures (including critical care time)  Update: Patient's case discussed with pharmacy.  Patient received a non-lysosomal version of the drug, whereas he previously has received lysosomal variants.  He has received sufficient quantity of the medication (per pharmacy) and infusion has ended.  On re-exam the patient is sleeping.  BP good, mild bradycardia - NAD.    11:46 AM Following infusion patient is shaking, but he remains awake, alert, appropriately interactive.  Blood pressure is 115/50, heart rate is 60s, he is afebrile. NS, benadryl provided.  12:10 PM Patient now asleep  2:43 PM Patient awake and alert, no active complaints. MDM   Final diagnoses:   Leishmaniasis    Patient presents for scheduled infusion therapy for leishmaniasis.  He received therapy, with minor complication, but no decompensation.  Patient will return tomorrow for therapy.  The patient required lysosomal amphotericin, this should be confirmed prior to providing medication.    Carmin Muskrat, MD 04/26/13 1444

## 2013-04-26 NOTE — Discharge Instructions (Signed)
Please return tomorrow for another infusion treatment.  Return here sooner if you develop new, or concerning changes in your condition.   Leishmaniasis Leishmaniasis is a parasitic disease. It is spread by the bite of infected sand flies. There are several different forms of leishmaniasis. The most common forms are cutaneous leishmaniasis, which causes skin sores, and visceral leishmaniasis, which affects some of the internal organs of the body (for example, spleen, liver, and bone marrow). Leishmaniasis is spread by the bite of some types of phlebotomine (blood sucking) sand flies. Sand flies become infected by biting an infected animal or person. Since sand flies do not make noise when they fly, people may not realize they are present. Sand flies are very small and may be hard to see. They are only about one-third the size of typical mosquitoes. Sand flies usually are most active in twilight, evening, and night-time hours. Sand flies are less active during the hottest time of the day. However, they will bite if they are disturbed, such as when a person brushes up against the trunk of a tree where sand flies are resting. Rarely, leishmaniasis is spread from a pregnant woman to her baby. Leishmaniasis also can be spread by blood transfusions or by contaminated needles. If not treated, visceral leishmaniasis can cause death.  SIGNS AND SYMPTOMS OF CUTANEOUS LEISHMANIASIS People who have cutaneous leishmaniasis have one or more sores on their skin. These develop within a few weeks (sometimes as long as months) from when they were bitten. If untreated, the sores can last from weeks to years. The sores can change in size and appearance over time. They often end up looking somewhat like a volcano, with a raised edge and central crater. Some sores are covered by a scab. The sores can be painless or painful. Some people have swollen glands near the sores (for example, under the arm if the sores are on the arm or  hand). SIGNS AND SYMPTOMS OF VISCERAL LEISHMANIASIS People with visceral leishmaniasis usually become sick within several months of when they were bitten. People who have visceral leishmaniasis usually have fever, weight loss, and an enlarged spleen and liver. Some patients have swollen glands. Certain blood tests are abnormal. For example, patients usually have low blood counts, including a low red blood cell count (anemia), low white blood cell count, and low platelet count.  WHO IS AT RISK FOR LEISHMANIASIS? People of all ages are at risk for leishmaniasis if they live or travel where leishmaniasis is found. Leishmaniasis usually is more common in rural than urban areas, but it is found in the outskirts of some cities. The risk for leishmaniasis is highest from dusk to dawn. This is when sand flies are the most active. All it takes to get infected is to be bitten by one infected sand fly. Adventure travelers, Veterinary surgeon, missionaries, Doctor, hospital (people who study birds), other people who do research outdoors at night, and soldiers are examples of people who may have an increased risk for leishmaniasis, especially cutaneous leishmaniasis.  CAN LEISHMANIASIS BE A SERIOUS DISEASE IF NOT TREATED? Yes. The skin sores of cutaneous leishmaniasis will heal on their own, but this can take months or even years. The sores can leave ugly scars. If not treated, infection that started in the skin could spread to the nose or mouth and cause sores there (mucosal leishmaniasis). This can happen with some of the types of the parasite found in [place] and [place]. Mucosal leishmaniasis might not be noticed until  years after the original skin sores healed. The best way to prevent mucosal leishmaniasis is to treat the cutaneous infection before it spreads. If not treated, visceral leishmaniasis can cause death.  WHAT SHOULD I DO IF I THINK I MIGHT HAVE LEISHMANIASIS? See your caregiver if you have  traveled to an area where leishmaniasis is found and you have developed skin sores that are not healing. Tell your caregiver where you have traveled and that you might be at risk for leishmaniasis. It is very rare for travelers to get visceral leishmaniasis.  HOW WILL MY CAREGIVER KNOW IF I HAVE LEISHMANIASIS? The first step is to find out if you have traveled to a part of the world where leishmaniasis is found. Your caregiver will ask you about any signs or symptoms of leishmaniasis you may have, such as skin sores that have not healed. If you have skin sores, your caregiver will likely want to take some samples directly from the sores. These samples can be examined for the parasite under a microscope, in cultures, and through other means. A blood test for detecting antibody (immune response) to the parasite can be helpful, particularly for cases of visceral leishmaniasis. However, tests to look for the parasite itself should also be done. CDC staff can help with the laboratory testing. Diagnosing leishmaniasis can be difficult. Sometimes the laboratory tests are negative even if a person has leishmaniasis. TREATMENT  Your caregiver can talk with CDC staff about whether your case of leishmaniasis should be treated. Most people who have cutaneous leishmaniasis do not need to be hospitalized during their treatment. PREVENTION  The best way for travelers to prevent leishmaniasis is by protecting themselves from sand fly bites. Vaccines and drugs for preventing infection are not yet available. Although sand flies are primarily nighttime biters, infection can be acquired during the daytime if resting sand flies are disturbed. Sand fly activity in an area can easily be underestimated because sand flies are noiseless fliers and rare bites might not be noticed. To decrease the risk of being bitten, travelers should:   Stay in well-screened or air-conditioned areas as much as possible.  Avoid outdoor activities,  especially from dusk to dawn, when sand flies are the most active.  Wear long-sleeved shirts, long pants, and socks when outside. Tuck your shirt into your pants.  Apply insect repellent on uncovered skin and under the ends of sleeves and pant legs. Follow the instructions on the label of the repellent. The most effective repellants are those that contain the chemical DEET (N,N-diethylmetatoluamide). The concentration of DEET varies among repellents. Repellents with DEET concentrations of 30-35% are quite effective. The effect should last about 4 hours. Lower concentrations should be used for children (no more than 10% DEET). Repellants with DEET should be used sparingly on children from 51 to 54 years old. Do not use at all on children less than 44 years old.  Spray clothing with permethrin-containing insecticides. The insecticide should be reapplied after every five washings.  Spray living and sleeping areas with an insecticide to kill insects.  If you are not sleeping in an area that is well screened or air-conditioned, use a bed net and tuck it under your mattress. If possible, use a bed net that has been soaked in or sprayed with permethrin. The permethrin will be effective for several months if the bed net is not washed. Keep in mind that sand flies are much smaller than mosquitoes and can get through smaller holes. Fine-mesh netting (at least  18 holes to the inch) is needed for an effective barrier against sand flies. This is particularly important if the bed net has not been treated with permethrin. However, it may be uncomfortable to sleep under such a closely woven bed net when it is hot. NOTE: Bed nets, repellants containing DEET, and permethrin should be purchased before traveling and can be found in hardware, camping, and [employer] surplus stores. IF I HAVE ALREADY HAD LEISHMANIASIS, COULD I GET IT AGAIN? Yes. Some people have had cutaneous leishmaniasis more than once. Therefore, you should  follow the preventive measures listed above whenever you are in an area where leishmaniasis is found.  Document Released: 12/14/2003 Document Revised: 04/10/2011 Document Reviewed: 04/26/2005 [facility] Patient Information 2014 [place].

## 2013-04-26 NOTE — ED Notes (Signed)
Amphotericin B infused at 166 ml/hr per pharmacist instructions

## 2013-04-26 NOTE — ED Notes (Signed)
Amphotericin stopped per Dr. Vanita Panda and pharmacists instructions

## 2013-04-26 NOTE — ED Notes (Signed)
Patient states that he is feeling nauseated

## 2013-04-26 NOTE — ED Notes (Signed)
Pt is in the process of receiving a 21 day infusion for Leishmaniasis. Pt usually goes to short stay for his infusion, but they are not open on the weekends so he was told to come here. Pt denies any pain or any other complaints at this time.

## 2013-04-27 ENCOUNTER — Encounter (HOSPITAL_COMMUNITY): Payer: Self-pay | Admitting: Emergency Medicine

## 2013-04-27 ENCOUNTER — Emergency Department (HOSPITAL_COMMUNITY)
Admission: EM | Admit: 2013-04-27 | Discharge: 2013-04-27 | Disposition: A | Discharge status: Home / Self Care | Payer: 59 | Attending: Emergency Medicine | Admitting: Emergency Medicine

## 2013-04-27 DIAGNOSIS — Z86711 Personal history of pulmonary embolism: Secondary | ICD-10-CM | POA: Insufficient documentation

## 2013-04-27 DIAGNOSIS — Z87891 Personal history of nicotine dependence: Secondary | ICD-10-CM | POA: Insufficient documentation

## 2013-04-27 DIAGNOSIS — Z860100 Personal history of colon polyps, unspecified: Secondary | ICD-10-CM | POA: Insufficient documentation

## 2013-04-27 DIAGNOSIS — N179 Acute kidney failure, unspecified: Secondary | ICD-10-CM

## 2013-04-27 DIAGNOSIS — E785 Hyperlipidemia, unspecified: Secondary | ICD-10-CM | POA: Insufficient documentation

## 2013-04-27 DIAGNOSIS — Z7982 Long term (current) use of aspirin: Secondary | ICD-10-CM | POA: Insufficient documentation

## 2013-04-27 DIAGNOSIS — Z79899 Other long term (current) drug therapy: Secondary | ICD-10-CM | POA: Insufficient documentation

## 2013-04-27 DIAGNOSIS — E876 Hypokalemia: Secondary | ICD-10-CM

## 2013-04-27 LAB — COMPREHENSIVE METABOLIC PANEL
ALK PHOS: 72 U/L (ref 39–117)
ALT: 29 U/L (ref 0–53)
AST: 20 U/L (ref 0–37)
Albumin: 3.1 g/dL — ABNORMAL LOW (ref 3.5–5.2)
BUN: 32 mg/dL — AB (ref 6–23)
CHLORIDE: 107 meq/L (ref 96–112)
CO2: 24 mEq/L (ref 19–32)
Calcium: 7.8 mg/dL — ABNORMAL LOW (ref 8.4–10.5)
Creatinine, Ser: 1.94 mg/dL — ABNORMAL HIGH (ref 0.50–1.35)
GFR calc Af Amer: 42 mL/min — ABNORMAL LOW (ref >90)
GFR calc non Af Amer: 36 mL/min — ABNORMAL LOW (ref >90)
Glucose, Bld: 148 mg/dL — ABNORMAL HIGH (ref 70–99)
Sodium: 144 mEq/L (ref 137–147)
Total Bilirubin: 0.7 mg/dL (ref 0.3–1.2)
Total Protein: 5.5 g/dL — ABNORMAL LOW (ref 6.0–8.3)

## 2013-04-27 LAB — CBC
HEMATOCRIT: 29.9 % — AB (ref 39.0–52.0)
HEMOGLOBIN: 11 g/dL — AB (ref 13.0–17.0)
MCH: 29.3 pg (ref 26.0–34.0)
MCHC: 36.8 g/dL — ABNORMAL HIGH (ref 30.0–36.0)
MCV: 79.7 fL (ref 78.0–100.0)
Platelets: 95 10*3/uL — ABNORMAL LOW (ref 150–400)
RBC: 3.75 MIL/uL — ABNORMAL LOW (ref 4.22–5.81)
RDW: 14 % (ref 11.5–15.5)
WBC: 3.6 10*3/uL — AB (ref 4.0–10.5)

## 2013-04-27 LAB — POTASSIUM: POTASSIUM: 2.6 meq/L — AB (ref 3.7–5.3)

## 2013-04-27 MED ORDER — ONDANSETRON HCL 4 MG/2ML IJ SOLN
4.0000 mg | Freq: Once | INTRAMUSCULAR | Status: AC
  Administered 2013-04-27: 4 mg via INTRAVENOUS
  Filled 2013-04-27: qty 2

## 2013-04-27 MED ORDER — MEPERIDINE HCL 25 MG/ML IJ SOLN
25.0000 mg | Freq: Once | INTRAMUSCULAR | Status: AC
  Administered 2013-04-27: 25 mg via INTRAVENOUS
  Filled 2013-04-27: qty 1

## 2013-04-27 MED ORDER — SODIUM CHLORIDE 0.9 % IV BOLUS (SEPSIS)
500.0000 mL | INTRAVENOUS | Status: AC
  Administered 2013-04-27: 500 mL via INTRAVENOUS

## 2013-04-27 MED ORDER — SODIUM CHLORIDE 0.9 % IV BOLUS (SEPSIS)
500.0000 mL | Freq: Once | INTRAVENOUS | Status: DC
Start: 2013-04-27 — End: 2013-04-27

## 2013-04-27 MED ORDER — ACETAMINOPHEN 325 MG PO TABS
650.0000 mg | ORAL_TABLET | Freq: Once | ORAL | Status: AC
  Administered 2013-04-27: 650 mg via ORAL
  Filled 2013-04-27: qty 2

## 2013-04-27 MED ORDER — POTASSIUM CHLORIDE CRYS ER 20 MEQ PO TBCR: 80.0000 meq | EXTENDED_RELEASE_TABLET | Freq: Once | ORAL | Status: DC

## 2013-04-27 MED ORDER — POTASSIUM CHLORIDE 10 MEQ/100ML IV SOLN
10.0000 meq | INTRAVENOUS | Status: AC
  Administered 2013-04-27 (×4): 10 meq via INTRAVENOUS
  Filled 2013-04-27 (×2): qty 100

## 2013-04-27 MED ORDER — DEXTROSE 5 % IV SOLN
300.0000 mg | INTRAVENOUS | Status: DC
  Administered 2013-04-27: 300 mg via INTRAVENOUS
  Filled 2013-04-27: qty 300

## 2013-04-27 MED ORDER — HEPARIN SOD (PORK) LOCK FLUSH 100 UNIT/ML IV SOLN
500.0000 [IU] | Freq: Once | INTRAVENOUS | Status: AC
  Administered 2013-04-27: 500 [IU]
  Filled 2013-04-27: qty 5

## 2013-04-27 MED ORDER — DIPHENHYDRAMINE HCL 50 MG/ML IJ SOLN
25.0000 mg | Freq: Once | INTRAMUSCULAR | Status: AC
  Administered 2013-04-27: 25 mg via INTRAVENOUS
  Filled 2013-04-27: qty 1

## 2013-04-27 MED ORDER — POTASSIUM CHLORIDE CRYS ER 20 MEQ PO TBCR
60.0000 meq | EXTENDED_RELEASE_TABLET | Freq: Once | ORAL | Status: DC
  Filled 2013-04-27: qty 3

## 2013-04-27 MED ORDER — DIPHENHYDRAMINE HCL 25 MG PO CAPS: 50.0000 mg | ORAL_CAPSULE | Freq: Once | ORAL | Status: DC

## 2013-04-27 MED ORDER — POTASSIUM CHLORIDE CRYS ER 20 MEQ PO TBCR
40.0000 meq | EXTENDED_RELEASE_TABLET | Freq: Once | ORAL | Status: AC
  Administered 2013-04-27: 40 meq via ORAL
  Filled 2013-04-27: qty 2

## 2013-04-27 NOTE — ED Provider Notes (Signed)
CSN: [id number]     Arrival date & time 04/27/13  3875 History   First MD Initiated Contact with Patient 04/27/13 [phone]     Chief Complaint  Patient presents with  . IV Medication      HPI  Patient presents for planned infusion. This is #14 of a planned 21 day course of Amphotericin for Leishmaniasis. Since yesterday (when I saw him) he notes no new fever, emesis, cp, dyspnea, urinary changes.  He does have ongoing anorexia.   Past Medical History  Diagnosis Date  . History of chicken pox     childhood  . Hyperlipidemia   . Colon polyps     2001, 2007  . Pulmonary embolus   . Leishmaniasis 05-2011   Past Surgical History  Procedure Laterality Date  . Tendon repair  2005    right peroneal tendon-[facility]  . Rotator cuff repair  2007    left rotator cuff-[facility]  . Cataract extraction, bilateral  2009    [facility]   Family History  Problem Relation Age of Onset  . Prostate cancer Father     alive  . Hyperlipidemia Mother   . Hypertension Mother   . Diabetes Mother   . Stroke Paternal Grandfather   . Heart attack Neg Hx   . Sudden death Neg Hx   . Clotting disorder Father     blood clots  . Stroke Maternal Grandfather    History  Substance Use Topics  . Smoking status: Former Research scientist (life sciences)  . Smokeless tobacco: Never Used     Comment: quit 1984 1/2 ppd for 4 years  . Alcohol Use: No    Review of Systems  All other systems reviewed and are negative.      Allergies  Review of patient's allergies indicates no known allergies.  Home Medications   Current Outpatient Rx  Name  Route  Sig  Dispense  Refill  . acetaminophen (TYLENOL) 325 MG tablet   Oral   Take 325-650 mg by mouth every 6 (six) hours as needed for mild pain or moderate pain (back pain).         Marland Kitchen aspirin 81 MG tablet   Oral   Take 162 mg by mouth at bedtime.          . mupirocin ointment (BACTROBAN) 2 %   Nasal   Place 1 application into  the nose 2 (two) times daily.         . ondansetron (ZOFRAN) 8 MG tablet   Oral   Take 1 tablet (8 mg total) by mouth every 8 (eight) hours as needed for nausea or vomiting.   30 tablet   0   . pravastatin (PRAVACHOL) 40 MG tablet   Oral   Take 40 mg by mouth daily.          BP 113/63  Pulse 51  Temp(Src) 98.2 F (36.8 C) (Oral)  Resp 18  SpO2 100% Physical Exam  Nursing note and vitals reviewed. Constitutional: He is oriented to person, place, and time.  Awake, alert, nontoxic appearance.  HENT:  Head: Atraumatic.  Eyes: Right eye exhibits no discharge. Left eye exhibits no discharge.  Pulmonary/Chest: Effort normal. No respiratory distress.  Musculoskeletal: He exhibits no tenderness.  No gross lesions  Neurological: He is alert and oriented to person, place, and time.  Speech is clear, face is symmetric and gait in normal  Skin: No rash noted.  Psychiatric: He has a normal mood and  affect.    ED Course  Procedures (including critical care time) Labs Review Labs Reviewed  CBC - Abnormal; Notable for the following:    WBC 3.6 (*)    RBC 3.75 (*)    Hemoglobin 11.0 (*)    HCT 29.9 (*)    MCHC 36.8 (*)    Platelets 95 (*)    All other components within normal limits  COMPREHENSIVE METABOLIC PANEL - Abnormal; Notable for the following:    Glucose, Bld 148 (*)    BUN 32 (*)    Creatinine, Ser 1.94 (*)    Calcium 7.8 (*)    Total Protein 5.5 (*)    Albumin 3.1 (*)    GFR calc non Af Amer 36 (*)    GFR calc Af Amer 42 (*)    All other components within normal limits   I reviewed the labs and yesterday's visit notes.  After the initial encounter I ensured that the patient will receive the appropriate formulation by speaking with pharmacy.   9:59 AM Labs demonstrate worsening renal function, hypokalemia.  I discussed patient's case with our infectious disease team.  With this progression, patient will not receive amphotericin today.  He will receive  potassium supplementation.  Patient has been intolerant of oral supplement of as far. Patient will follow up with infectious disease tomorrow  4:04 PM Repeat potassium is increasing.  Patient is sitting upright, in no distress, smiling. He will f/u tomorrow with ID. He will receive additional PO supplement prior to DC.   MDM   Final diagnoses:  Hypokalemia  Acute kidney injury    Patient presents for a planned IV treatment session, but is found to have progressive renal disease and hypokalemia prohibiting the treatment. Patient had IVF resuscitation and potassium supplementation, which was well tolerated. With increase in serum K, and no distress, he will f/u tomorrow for re-check and consideration of appropriate ongoing care. Throughout the patient's emergency department course today and yesterday he had no decompensation, fever, blood pressure issues. Patient is mildly bradycardic, but without other dysrhythmia, or any cp / syncope.      Carmin Muskrat, MD 04/27/13 [phone]

## 2013-04-27 NOTE — Discharge Instructions (Signed)
As discussed, it is important that you follow up as soon as possible with your physician for continued management of your condition. ° °If you develop any new, or concerning changes in your condition, please return to the emergency department immediately. ° °

## 2013-04-27 NOTE — ED Notes (Signed)
Potassium critical level 2.4 EDP aware. EKG ordered .

## 2013-04-27 NOTE — ED Notes (Signed)
PER EDP [place] ,draw Potassium level after last dose of IV potassium

## 2013-04-27 NOTE — ED Notes (Signed)
Last dose of potassium infused . Lab contacted to draw Potassium level.

## 2013-04-27 NOTE — ED Notes (Signed)
PT unable to Tol. PO potassium . POC to give via PICC line.

## 2013-04-27 NOTE — ED Notes (Signed)
EDP [facility] aware  of PT HR .

## 2013-04-27 NOTE — ED Notes (Addendum)
Here for his short stay infusion, here for infusion on the weekends. Day 14 of 21 day infusions. To be getting Amphotericin B for tropical infection "Leishmaniasis". He lived in [place] for 24 years.  "pt to see Dr. Vanita Panda". (denies: any sx at this time).

## 2013-04-28 ENCOUNTER — Other Ambulatory Visit: Payer: Self-pay | Admitting: *Deleted

## 2013-04-28 ENCOUNTER — Other Ambulatory Visit: Payer: 59

## 2013-04-28 ENCOUNTER — Telehealth: Payer: Self-pay | Admitting: *Deleted

## 2013-04-28 ENCOUNTER — Other Ambulatory Visit: Payer: Self-pay | Admitting: Internal Medicine

## 2013-04-28 ENCOUNTER — Encounter (HOSPITAL_COMMUNITY): Payer: Self-pay

## 2013-04-28 ENCOUNTER — Ambulatory Visit: Payer: Self-pay | Admitting: Infectious Disease

## 2013-04-28 DIAGNOSIS — N179 Acute kidney failure, unspecified: Secondary | ICD-10-CM

## 2013-04-28 LAB — COMPLETE METABOLIC PANEL WITH GFR
ALT: 30 U/L (ref 0–53)
AST: 22 U/L (ref 0–37)
Albumin: 3.6 g/dL (ref 3.5–5.2)
Alkaline Phosphatase: 85 U/L (ref 39–117)
BILIRUBIN TOTAL: 0.6 mg/dL (ref 0.3–1.2)
BUN: 26 mg/dL — ABNORMAL HIGH (ref 6–23)
CO2: 25 mEq/L (ref 19–32)
Calcium: 8.3 mg/dL — ABNORMAL LOW (ref 8.4–10.5)
Chloride: 107 mEq/L (ref 96–112)
Creat: 1.92 mg/dL — ABNORMAL HIGH (ref 0.50–1.35)
GFR, EST AFRICAN AMERICAN: 43 mL/min — AB
GFR, EST NON AFRICAN AMERICAN: 37 mL/min — AB
GLUCOSE: 148 mg/dL — AB (ref 70–99)
Potassium: 2.7 mEq/L — CL (ref 3.5–5.3)
Sodium: 148 mEq/L — ABNORMAL HIGH (ref 135–145)
Total Protein: 6.2 g/dL (ref 6.0–8.3)

## 2013-04-28 LAB — MAGNESIUM: Magnesium: 1.5 mg/dL (ref 1.5–2.5)

## 2013-04-28 MED ORDER — MAGNESIUM GLUCONATE 30 MG PO TABS: 30.0000 mg | ORAL_TABLET | Freq: Two times a day (BID) | ORAL | Status: DC

## 2013-04-28 MED ORDER — POTASSIUM CHLORIDE ER 10 MEQ PO TBCR: 40.0000 meq | EXTENDED_RELEASE_TABLET | Freq: Two times a day (BID) | ORAL | Status: DC

## 2013-04-28 NOTE — Telephone Encounter (Signed)
D/C IV ABX doses for 04/29/13 and 05/01/13 per Dr. Linus Salmons.  Draw CMP 04/30/13 per Dr. Linus Salmons.  Faxing order to Short Stay at [phone] with these orders.  Fax confirmation received.

## 2013-04-28 NOTE — Addendum Note (Signed)
Addended by: Lorne Skeens D on: 04/28/2013 11:34 AM   Modules accepted: Orders

## 2013-04-29 ENCOUNTER — Telehealth: Payer: Self-pay | Admitting: *Deleted

## 2013-04-29 ENCOUNTER — Encounter (HOSPITAL_COMMUNITY): Payer: Self-pay

## 2013-04-29 ENCOUNTER — Ambulatory Visit: Payer: Self-pay | Admitting: Internal Medicine

## 2013-04-29 ENCOUNTER — Telehealth: Payer: Self-pay | Admitting: Licensed Clinical Social Worker

## 2013-04-29 NOTE — Telephone Encounter (Signed)
Yes, ok to substitute, Mag Ox is fine.

## 2013-04-29 NOTE — Telephone Encounter (Signed)
Confirmed with Kristen in Short Stay B.  She will contact Dr. Linus Salmons with questions.   Landis Gandy, RN

## 2013-04-29 NOTE — Telephone Encounter (Signed)
Ok I will notify pharmacy

## 2013-04-29 NOTE — Telephone Encounter (Signed)
Message copied by Landis Gandy on Tue Apr 29, 2013  3:55 PM ------      Message from: Thayer Headings      Created: Tue Apr 29, 2013  2:53 PM       jsut to be sure, he is getting his next dose of lipid ampho tomorrow am in short stay.  They should check a CMP and get results (stat) prior to administration to be sure his creatinine is ok and can call me with result on my cell at [phone].  Thanks.   ------

## 2013-04-29 NOTE — Telephone Encounter (Signed)
Pharmacy is out of Magnesium Magonate and wanted to know if it was ok to substitute with Magnesium oxide or Magnesium Citrate. Please advise

## 2013-04-30 ENCOUNTER — Encounter (HOSPITAL_COMMUNITY)
Admission: RE | Admit: 2013-04-30 | Discharge: 2013-04-30 | Disposition: A | Discharge status: Home / Self Care | Payer: 59 | Source: Ambulatory Visit | Attending: Internal Medicine | Admitting: Internal Medicine

## 2013-04-30 ENCOUNTER — Inpatient Hospital Stay: Payer: Self-pay | Admitting: Internal Medicine

## 2013-04-30 DIAGNOSIS — B552 Mucocutaneous leishmaniasis: Secondary | ICD-10-CM | POA: Insufficient documentation

## 2013-04-30 LAB — COMPREHENSIVE METABOLIC PANEL
ALBUMIN: 3.2 g/dL — AB (ref 3.5–5.2)
ALT: 30 U/L (ref 0–53)
AST: 25 U/L (ref 0–37)
Alkaline Phosphatase: 81 U/L (ref 39–117)
BILIRUBIN TOTAL: 0.5 mg/dL (ref 0.3–1.2)
BUN: 24 mg/dL — AB (ref 6–23)
CALCIUM: 8.1 mg/dL — AB (ref 8.4–10.5)
CO2: 26 mEq/L (ref 19–32)
CREATININE: 1.62 mg/dL — AB (ref 0.50–1.35)
Chloride: 106 mEq/L (ref 96–112)
GFR calc Af Amer: 52 mL/min — ABNORMAL LOW (ref >90)
GFR calc non Af Amer: 45 mL/min — ABNORMAL LOW (ref >90)
Glucose, Bld: 174 mg/dL — ABNORMAL HIGH (ref 70–99)
Potassium: 2.8 mEq/L — CL (ref 3.7–5.3)
Sodium: 145 mEq/L (ref 137–147)
Total Protein: 5.8 g/dL — ABNORMAL LOW (ref 6.0–8.3)

## 2013-04-30 MED ORDER — SODIUM CHLORIDE 0.9 % IV SOLN
Freq: Two times a day (BID) | INTRAVENOUS | Status: DC | PRN
  Administered 2013-04-30: 09:00:00 via INTRAVENOUS
  Administered 2013-04-30: 500 mL/h via INTRAVENOUS

## 2013-04-30 MED ORDER — MEPERIDINE HCL 25 MG/ML IJ SOLN
INTRAMUSCULAR | Status: AC
  Administered 2013-04-30: 25 mg via INTRAVENOUS
  Filled 2013-04-30: qty 1

## 2013-04-30 MED ORDER — ACETAMINOPHEN 325 MG PO TABS
ORAL_TABLET | ORAL | Status: AC
  Filled 2013-04-30: qty 2

## 2013-04-30 MED ORDER — MEPERIDINE HCL 25 MG/ML IJ SOLN
25.0000 mg | Freq: Every day | INTRAMUSCULAR | Status: DC
  Administered 2013-04-30: 25 mg via INTRAVENOUS

## 2013-04-30 MED ORDER — DIPHENHYDRAMINE HCL 50 MG/ML IJ SOLN
50.0000 mg | Freq: Every day | INTRAMUSCULAR | Status: DC
  Administered 2013-04-30: 50 mg via INTRAVENOUS

## 2013-04-30 MED ORDER — DIPHENHYDRAMINE HCL 50 MG/ML IJ SOLN
INTRAMUSCULAR | Status: AC
  Administered 2013-04-30: 50 mg via INTRAVENOUS
  Filled 2013-04-30: qty 1

## 2013-04-30 MED ORDER — HEPARIN SOD (PORK) LOCK FLUSH 100 UNIT/ML IV SOLN: 250.0000 [IU] | INTRAVENOUS | Status: DC | PRN

## 2013-04-30 MED ORDER — HEPARIN SOD (PORK) LOCK FLUSH 100 UNIT/ML IV SOLN
250.0000 [IU] | Freq: Every day | INTRAVENOUS | Status: DC
  Administered 2013-04-30: 250 [IU]

## 2013-04-30 MED ORDER — ACETAMINOPHEN 325 MG PO TABS
650.0000 mg | ORAL_TABLET | Freq: Every day | ORAL | Status: DC
  Administered 2013-04-30: 650 mg via ORAL

## 2013-04-30 MED ORDER — POTASSIUM CHLORIDE 10 MEQ/100ML IV SOLN
10.0000 meq | INTRAVENOUS | Status: AC
  Administered 2013-04-30 (×4): 10 meq via INTRAVENOUS
  Filled 2013-04-30 (×4): qty 100

## 2013-04-30 MED ORDER — DEXTROSE 5 % IV SOLN: Freq: Every day | INTRAVENOUS | Status: DC

## 2013-04-30 MED ORDER — DEXTROSE 5 % IV SOLN
300.0000 mg | Freq: Every day | INTRAVENOUS | Status: DC
  Administered 2013-04-30: 300 mg via INTRAVENOUS
  Filled 2013-04-30: qty 300

## 2013-04-30 MED ORDER — HEPARIN SOD (PORK) LOCK FLUSH 100 UNIT/ML IV SOLN
INTRAVENOUS | Status: AC
  Administered 2013-04-30: 250 [IU]
  Filled 2013-04-30: qty 5

## 2013-04-30 NOTE — Progress Notes (Signed)
Called and spoke with Dr Linus Salmons regarding patient picc line care on days he is not coming in to short stay.  Dr Linus Salmons stated his office would contact home health and the patient to set up picc line care on days he is not at short stay.  I passed this information on to the patient and he verbalized understanding.

## 2013-05-01 ENCOUNTER — Encounter (HOSPITAL_COMMUNITY): Payer: Self-pay

## 2013-05-02 ENCOUNTER — Encounter (HOSPITAL_COMMUNITY)
Admission: RE | Admit: 2013-05-02 | Discharge: 2013-05-02 | Disposition: A | Discharge status: Home / Self Care | Payer: 59 | Source: Ambulatory Visit | Attending: Internal Medicine | Admitting: Internal Medicine

## 2013-05-02 LAB — COMPREHENSIVE METABOLIC PANEL
ALT: 41 U/L (ref 0–53)
AST: 30 U/L (ref 0–37)
Albumin: 3.2 g/dL — ABNORMAL LOW (ref 3.5–5.2)
Alkaline Phosphatase: 89 U/L (ref 39–117)
BUN: 18 mg/dL (ref 6–23)
CALCIUM: 7.9 mg/dL — AB (ref 8.4–10.5)
CO2: 26 meq/L (ref 19–32)
Chloride: 106 mEq/L (ref 96–112)
Creatinine, Ser: 1.34 mg/dL (ref 0.50–1.35)
GFR calc Af Amer: 65 mL/min — ABNORMAL LOW (ref >90)
GFR calc non Af Amer: 56 mL/min — ABNORMAL LOW (ref >90)
Glucose, Bld: 147 mg/dL — ABNORMAL HIGH (ref 70–99)
Potassium: 3.1 mEq/L — ABNORMAL LOW (ref 3.7–5.3)
Sodium: 145 mEq/L (ref 137–147)
Total Bilirubin: 0.5 mg/dL (ref 0.3–1.2)
Total Protein: 5.8 g/dL — ABNORMAL LOW (ref 6.0–8.3)

## 2013-05-02 MED ORDER — HEPARIN SOD (PORK) LOCK FLUSH 100 UNIT/ML IV SOLN: 250.0000 [IU] | Freq: Every day | INTRAVENOUS | Status: DC

## 2013-05-02 MED ORDER — HEPARIN SOD (PORK) LOCK FLUSH 100 UNIT/ML IV SOLN: 250.0000 [IU] | INTRAVENOUS | Status: DC | PRN

## 2013-05-02 MED ORDER — MEPERIDINE HCL 25 MG/ML IJ SOLN
25.0000 mg | Freq: Every day | INTRAMUSCULAR | Status: DC
  Administered 2013-05-02: 25 mg via INTRAVENOUS

## 2013-05-02 MED ORDER — MEPERIDINE HCL 25 MG/ML IJ SOLN
INTRAMUSCULAR | Status: AC
  Filled 2013-05-02: qty 1

## 2013-05-02 MED ORDER — DIPHENHYDRAMINE HCL 50 MG/ML IJ SOLN
50.0000 mg | Freq: Every day | INTRAMUSCULAR | Status: DC
  Administered 2013-05-02: 50 mg via INTRAVENOUS

## 2013-05-02 MED ORDER — DIPHENHYDRAMINE HCL 50 MG/ML IJ SOLN
INTRAMUSCULAR | Status: AC
  Administered 2013-05-02: 50 mg via INTRAVENOUS
  Filled 2013-05-02: qty 1

## 2013-05-02 MED ORDER — HEPARIN SOD (PORK) LOCK FLUSH 100 UNIT/ML IV SOLN
INTRAVENOUS | Status: AC
  Filled 2013-05-02: qty 5

## 2013-05-02 MED ORDER — DEXTROSE 5 % IV SOLN
Freq: Every day | INTRAVENOUS | Status: DC
  Administered 2013-05-02: 12:00:00 via INTRAVENOUS

## 2013-05-02 MED ORDER — SODIUM CHLORIDE 0.9 % IV SOLN
Freq: Two times a day (BID) | INTRAVENOUS | Status: DC | PRN
  Administered 2013-05-02 (×2): 500 mL/h via INTRAVENOUS

## 2013-05-02 MED ORDER — DEXTROSE 5 % IV SOLN
300.0000 mg | Freq: Every day | INTRAVENOUS | Status: DC
  Administered 2013-05-02: 300 mg via INTRAVENOUS
  Filled 2013-05-02: qty 300

## 2013-05-02 MED ORDER — ACETAMINOPHEN 325 MG PO TABS
ORAL_TABLET | ORAL | Status: AC
  Administered 2013-05-02: 650 mg via ORAL
  Filled 2013-05-02: qty 2

## 2013-05-02 MED ORDER — ACETAMINOPHEN 325 MG PO TABS
650.0000 mg | ORAL_TABLET | Freq: Every day | ORAL | Status: DC
  Administered 2013-05-02: 650 mg via ORAL

## 2013-05-02 NOTE — Progress Notes (Signed)
Picc line dressing site changed using sterile technique per protocal.  Site WNL

## 2013-05-05 ENCOUNTER — Telehealth: Payer: Self-pay | Admitting: *Deleted

## 2013-05-05 ENCOUNTER — Encounter (HOSPITAL_COMMUNITY)
Admission: RE | Admit: 2013-05-05 | Discharge: 2013-05-05 | Disposition: A | Discharge status: Home / Self Care | Payer: 59 | Source: Ambulatory Visit | Attending: Internal Medicine | Admitting: Internal Medicine

## 2013-05-05 ENCOUNTER — Other Ambulatory Visit: Payer: Self-pay | Admitting: Internal Medicine

## 2013-05-05 DIAGNOSIS — B559 Leishmaniasis, unspecified: Secondary | ICD-10-CM

## 2013-05-05 MED ORDER — ACETAMINOPHEN 325 MG PO TABS
650.0000 mg | ORAL_TABLET | Freq: Every day | ORAL | Status: DC
  Administered 2013-05-05: 650 mg via ORAL

## 2013-05-05 MED ORDER — HEPARIN SOD (PORK) LOCK FLUSH 100 UNIT/ML IV SOLN
250.0000 [IU] | INTRAVENOUS | Status: DC | PRN
  Administered 2013-05-05: 250 [IU]

## 2013-05-05 MED ORDER — SODIUM CHLORIDE 0.9 % IV SOLN
Freq: Two times a day (BID) | INTRAVENOUS | Status: DC | PRN
  Administered 2013-05-05: 1000 mL via INTRAVENOUS

## 2013-05-05 MED ORDER — ACETAMINOPHEN 325 MG PO TABS
ORAL_TABLET | ORAL | Status: AC
  Administered 2013-05-05: 650 mg via ORAL
  Filled 2013-05-05: qty 2

## 2013-05-05 MED ORDER — MEPERIDINE HCL 25 MG/ML IJ SOLN
INTRAMUSCULAR | Status: AC
  Administered 2013-05-05: 25 mg via INTRAVENOUS
  Filled 2013-05-05: qty 1

## 2013-05-05 MED ORDER — DIPHENHYDRAMINE HCL 50 MG/ML IJ SOLN
50.0000 mg | Freq: Every day | INTRAMUSCULAR | Status: DC
  Administered 2013-05-05: 50 mg via INTRAVENOUS

## 2013-05-05 MED ORDER — DEXTROSE 5 % IV SOLN
Freq: Every day | INTRAVENOUS | Status: DC
  Administered 2013-05-05: 500 mL via INTRAVENOUS

## 2013-05-05 MED ORDER — HEPARIN SOD (PORK) LOCK FLUSH 100 UNIT/ML IV SOLN
INTRAVENOUS | Status: AC
Start: 2013-05-05 — End: 2013-05-05
  Administered 2013-05-05: 250 [IU]
  Filled 2013-05-05: qty 5

## 2013-05-05 MED ORDER — DEXTROSE 5 % IV SOLN
300.0000 mg | Freq: Every day | INTRAVENOUS | Status: DC
  Administered 2013-05-05: 300 mg via INTRAVENOUS
  Filled 2013-05-05: qty 300

## 2013-05-05 MED ORDER — HEPARIN SOD (PORK) LOCK FLUSH 100 UNIT/ML IV SOLN: 250.0000 [IU] | Freq: Every day | INTRAVENOUS | Status: DC

## 2013-05-05 MED ORDER — DIPHENHYDRAMINE HCL 50 MG/ML IJ SOLN
INTRAMUSCULAR | Status: AC
  Administered 2013-05-05: 50 mg via INTRAVENOUS
  Filled 2013-05-05: qty 1

## 2013-05-05 MED ORDER — MEPERIDINE HCL 25 MG/ML IJ SOLN
25.0000 mg | Freq: Every day | INTRAMUSCULAR | Status: DC
  Administered 2013-05-05: 25 mg via INTRAVENOUS

## 2013-05-05 NOTE — Telephone Encounter (Signed)
Call from patient's wife; stating that the [facility] is requesting he see another ID MD in [place], [place] and he has been given a work in appt for 05/16/13. He had an appt on that day at short stay for IV antibiotic and wanted to have that appt changed to 05/19/13. Spoke with Dr. Baxter Flattery and she was ok with changing date. Short Stay, scheduler, [place] notified. Patient requested last office note and lab, release signed and note given. Explained he will need to sign release at [facility] for their notes. Myrtis Hopping

## 2013-05-05 NOTE — Telephone Encounter (Signed)
Call from Dr. Baxter Flattery for patient to have a [place] lab at short stay prior to IV antibiotic on 05/07/13. Short stay notified and given Dr. Storm Frisk pager and cell # to report results. Myrtis Hopping

## 2013-05-06 ENCOUNTER — Other Ambulatory Visit (HOSPITAL_COMMUNITY): Payer: Self-pay | Admitting: *Deleted

## 2013-05-07 ENCOUNTER — Other Ambulatory Visit: Payer: Self-pay | Admitting: Internal Medicine

## 2013-05-07 ENCOUNTER — Encounter (HOSPITAL_COMMUNITY)
Admission: RE | Admit: 2013-05-07 | Discharge: 2013-05-07 | Disposition: A | Discharge status: Home / Self Care | Payer: 59 | Source: Ambulatory Visit | Attending: Internal Medicine | Admitting: Internal Medicine

## 2013-05-07 ENCOUNTER — Telehealth: Payer: Self-pay | Admitting: *Deleted

## 2013-05-07 DIAGNOSIS — B559 Leishmaniasis, unspecified: Secondary | ICD-10-CM

## 2013-05-07 LAB — COMPREHENSIVE METABOLIC PANEL
ALK PHOS: 99 U/L (ref 39–117)
ALT: 38 U/L (ref 0–53)
AST: 28 U/L (ref 0–37)
Albumin: 3.4 g/dL — ABNORMAL LOW (ref 3.5–5.2)
BUN: 22 mg/dL (ref 6–23)
CALCIUM: 8.6 mg/dL (ref 8.4–10.5)
CO2: 29 mEq/L (ref 19–32)
Chloride: 102 mEq/L (ref 96–112)
Creatinine, Ser: 1.37 mg/dL — ABNORMAL HIGH (ref 0.50–1.35)
GFR, EST AFRICAN AMERICAN: 64 mL/min — AB (ref >90)
GFR, EST NON AFRICAN AMERICAN: 55 mL/min — AB (ref >90)
GLUCOSE: 152 mg/dL — AB (ref 70–99)
Potassium: 3.1 mEq/L — ABNORMAL LOW (ref 3.7–5.3)
Sodium: 145 mEq/L (ref 137–147)
TOTAL PROTEIN: 6.2 g/dL (ref 6.0–8.3)
Total Bilirubin: 0.5 mg/dL (ref 0.3–1.2)

## 2013-05-07 MED ORDER — HEPARIN SOD (PORK) LOCK FLUSH 100 UNIT/ML IV SOLN: 250.0000 [IU] | INTRAVENOUS | Status: DC | PRN

## 2013-05-07 MED ORDER — ACETAMINOPHEN 325 MG PO TABS
ORAL_TABLET | ORAL | Status: AC
  Filled 2013-05-07: qty 2

## 2013-05-07 MED ORDER — DEXTROSE 5 % IV SOLN
Freq: Every day | INTRAVENOUS | Status: DC
  Administered 2013-05-07: 11:00:00 via INTRAVENOUS

## 2013-05-07 MED ORDER — ACETAMINOPHEN 325 MG PO TABS
650.0000 mg | ORAL_TABLET | Freq: Every day | ORAL | Status: DC
  Administered 2013-05-07: 650 mg via ORAL

## 2013-05-07 MED ORDER — MEPERIDINE HCL 25 MG/ML IJ SOLN
INTRAMUSCULAR | Status: AC
  Filled 2013-05-07: qty 1

## 2013-05-07 MED ORDER — DIPHENHYDRAMINE HCL 50 MG/ML IJ SOLN
INTRAMUSCULAR | Status: AC
  Filled 2013-05-07: qty 1

## 2013-05-07 MED ORDER — MEPERIDINE HCL 25 MG/ML IJ SOLN
25.0000 mg | Freq: Every day | INTRAMUSCULAR | Status: DC
  Administered 2013-05-07: 25 mg via INTRAVENOUS

## 2013-05-07 MED ORDER — DEXTROSE 5 % IV SOLN
300.0000 mg | Freq: Every day | INTRAVENOUS | Status: DC
  Administered 2013-05-07: 300 mg via INTRAVENOUS
  Filled 2013-05-07: qty 300

## 2013-05-07 MED ORDER — HEPARIN SOD (PORK) LOCK FLUSH 100 UNIT/ML IV SOLN
250.0000 [IU] | Freq: Every day | INTRAVENOUS | Status: DC
  Administered 2013-05-07: 250 [IU]

## 2013-05-07 MED ORDER — SODIUM CHLORIDE 0.9 % IV SOLN
Freq: Two times a day (BID) | INTRAVENOUS | Status: DC | PRN
  Administered 2013-05-07: 11:00:00 via INTRAVENOUS
  Administered 2013-05-07: 500 mL/h via INTRAVENOUS

## 2013-05-07 MED ORDER — DIPHENHYDRAMINE HCL 50 MG/ML IJ SOLN
50.0000 mg | Freq: Every day | INTRAMUSCULAR | Status: DC
  Administered 2013-05-07: 50 mg via INTRAVENOUS

## 2013-05-07 NOTE — Telephone Encounter (Signed)
Per Dr. Baxter Flattery lab order for [facility] and Magnesium given to Joseph Art, RN at short stay to be drawn prior to patient's IV infusion on Friday 05/09/13. Results to be called to Dr. Baxter Flattery. Myrtis Hopping

## 2013-05-09 ENCOUNTER — Other Ambulatory Visit: Payer: Self-pay | Admitting: Internal Medicine

## 2013-05-09 ENCOUNTER — Ambulatory Visit (HOSPITAL_COMMUNITY)
Admission: RE | Admit: 2013-05-09 | Discharge: 2013-05-09 | Disposition: A | Discharge status: Home / Self Care | Payer: 59 | Source: Ambulatory Visit | Attending: Internal Medicine | Admitting: Internal Medicine

## 2013-05-09 ENCOUNTER — Encounter (HOSPITAL_COMMUNITY)
Admission: RE | Admit: 2013-05-09 | Discharge: 2013-05-09 | Disposition: A | Discharge status: Home / Self Care | Payer: 59 | Source: Ambulatory Visit | Attending: Internal Medicine | Admitting: Internal Medicine

## 2013-05-09 DIAGNOSIS — I499 Cardiac arrhythmia, unspecified: Secondary | ICD-10-CM | POA: Insufficient documentation

## 2013-05-09 DIAGNOSIS — B559 Leishmaniasis, unspecified: Secondary | ICD-10-CM

## 2013-05-09 LAB — BASIC METABOLIC PANEL
BUN: 23 mg/dL (ref 6–23)
CALCIUM: 8.9 mg/dL (ref 8.4–10.5)
CHLORIDE: 108 meq/L (ref 96–112)
CO2: 28 mEq/L (ref 19–32)
CREATININE: 1.57 mg/dL — AB (ref 0.50–1.35)
GFR calc Af Amer: 54 mL/min — ABNORMAL LOW (ref >90)
GFR calc non Af Amer: 47 mL/min — ABNORMAL LOW (ref >90)
Glucose, Bld: 128 mg/dL — ABNORMAL HIGH (ref 70–99)
Potassium: 3.7 mEq/L (ref 3.7–5.3)
Sodium: 149 mEq/L — ABNORMAL HIGH (ref 137–147)

## 2013-05-09 LAB — MAGNESIUM: Magnesium: 2 mg/dL (ref 1.5–2.5)

## 2013-05-09 MED ORDER — MEPERIDINE HCL 25 MG/ML IJ SOLN: 25.0000 mg | Freq: Every day | INTRAMUSCULAR | Status: DC

## 2013-05-09 MED ORDER — DEXTROSE 5 % IV SOLN: Freq: Every day | INTRAVENOUS | Status: DC

## 2013-05-09 MED ORDER — SODIUM CHLORIDE 0.9 % IV SOLN
Freq: Two times a day (BID) | INTRAVENOUS | Status: DC | PRN
  Administered 2013-05-09: 500 mL/h via INTRAVENOUS

## 2013-05-09 MED ORDER — SODIUM CHLORIDE 0.9 % IV SOLN
Freq: Once | INTRAVENOUS | Status: AC
  Administered 2013-05-09: 500 mL/h via INTRAVENOUS

## 2013-05-09 MED ORDER — DIPHENHYDRAMINE HCL 50 MG/ML IJ SOLN: 50.0000 mg | Freq: Every day | INTRAMUSCULAR | Status: DC

## 2013-05-09 MED ORDER — HEPARIN SOD (PORK) LOCK FLUSH 100 UNIT/ML IV SOLN
INTRAVENOUS | Status: AC
  Filled 2013-05-09: qty 5

## 2013-05-09 MED ORDER — ACETAMINOPHEN 325 MG PO TABS: 650.0000 mg | ORAL_TABLET | Freq: Every day | ORAL | Status: DC

## 2013-05-09 MED ORDER — HEPARIN SOD (PORK) LOCK FLUSH 100 UNIT/ML IV SOLN
250.0000 [IU] | Freq: Every day | INTRAVENOUS | Status: DC
  Administered 2013-05-09: 250 [IU]

## 2013-05-09 MED ORDER — HEPARIN SOD (PORK) LOCK FLUSH 100 UNIT/ML IV SOLN
250.0000 [IU] | INTRAVENOUS | Status: DC | PRN
Start: 2013-05-09 — End: 2013-05-10

## 2013-05-09 NOTE — Progress Notes (Signed)
Right upper arm PICC line dressing changed using sterile technique per protocol.  Site WNL.  Patient tolerated well.  PICC also flushed with 92mL of NS and 2.85mL of 100 units/mL of heparin.

## 2013-05-10 ENCOUNTER — Telehealth: Payer: Self-pay | Admitting: Internal Medicine

## 2013-05-10 NOTE — Telephone Encounter (Signed)
We held his ampho dosing on Friday 4/10 due to mildly increased Cr from 4/8 infusion. He received 1L NS in infusion center and Pt asked to cut down K+ supp to 1meQ daily. Due to complaint of fluttering of heart episodes 1-2 per day, cxr was done in order to check picc line position. cxr shows end of picc in svc not in atria, thought to be unlikely cause. We will recheck bmp prior to infusion on Monday 4/13

## 2013-05-12 ENCOUNTER — Encounter (HOSPITAL_COMMUNITY)
Admission: RE | Admit: 2013-05-12 | Discharge: 2013-05-12 | Disposition: A | Discharge status: Home / Self Care | Payer: 59 | Source: Ambulatory Visit | Attending: Internal Medicine | Admitting: Internal Medicine

## 2013-05-12 DIAGNOSIS — Z5181 Encounter for therapeutic drug level monitoring: Secondary | ICD-10-CM | POA: Insufficient documentation

## 2013-05-12 DIAGNOSIS — B559 Leishmaniasis, unspecified: Secondary | ICD-10-CM | POA: Insufficient documentation

## 2013-05-12 LAB — BASIC METABOLIC PANEL
BUN: 19 mg/dL (ref 6–23)
CO2: 27 mEq/L (ref 19–32)
Calcium: 8.7 mg/dL (ref 8.4–10.5)
Chloride: 102 mEq/L (ref 96–112)
Creatinine, Ser: 1.28 mg/dL (ref 0.50–1.35)
GFR, EST AFRICAN AMERICAN: 69 mL/min — AB (ref >90)
GFR, EST NON AFRICAN AMERICAN: 60 mL/min — AB (ref >90)
Glucose, Bld: 126 mg/dL — ABNORMAL HIGH (ref 70–99)
POTASSIUM: 3.7 meq/L (ref 3.7–5.3)
SODIUM: 145 meq/L (ref 137–147)

## 2013-05-12 MED ORDER — DIPHENHYDRAMINE HCL 50 MG/ML IJ SOLN
INTRAMUSCULAR | Status: AC
  Filled 2013-05-12: qty 1

## 2013-05-12 MED ORDER — MEPERIDINE HCL 25 MG/ML IJ SOLN
25.0000 mg | Freq: Every day | INTRAMUSCULAR | Status: DC
  Administered 2013-05-12: 25 mg via INTRAVENOUS

## 2013-05-12 MED ORDER — HEPARIN SOD (PORK) LOCK FLUSH 100 UNIT/ML IV SOLN
INTRAVENOUS | Status: AC
  Administered 2013-05-12: 500 [IU]
  Filled 2013-05-12: qty 5

## 2013-05-12 MED ORDER — MEPERIDINE HCL 25 MG/ML IJ SOLN
INTRAMUSCULAR | Status: AC
  Filled 2013-05-12: qty 1

## 2013-05-12 MED ORDER — HEPARIN SOD (PORK) LOCK FLUSH 100 UNIT/ML IV SOLN: 250.0000 [IU] | Freq: Every day | INTRAVENOUS | Status: DC

## 2013-05-12 MED ORDER — ACETAMINOPHEN 325 MG PO TABS
ORAL_TABLET | ORAL | Status: AC
  Filled 2013-05-12: qty 2

## 2013-05-12 MED ORDER — DEXTROSE 5 % IV SOLN
300.0000 mg | Freq: Every day | INTRAVENOUS | Status: DC
  Administered 2013-05-12: 300 mg via INTRAVENOUS
  Filled 2013-05-12 (×2): qty 300

## 2013-05-12 MED ORDER — DEXTROSE 5 % IV SOLN
Freq: Every day | INTRAVENOUS | Status: DC
  Administered 2013-05-12: 12:00:00 via INTRAVENOUS

## 2013-05-12 MED ORDER — ACETAMINOPHEN 325 MG PO TABS
650.0000 mg | ORAL_TABLET | Freq: Every day | ORAL | Status: DC
  Administered 2013-05-12: 650 mg via ORAL

## 2013-05-12 MED ORDER — SODIUM CHLORIDE 0.9 % IV SOLN
Freq: Two times a day (BID) | INTRAVENOUS | Status: DC | PRN
  Administered 2013-05-12 (×2): 500 mL via INTRAVENOUS

## 2013-05-12 MED ORDER — HEPARIN SOD (PORK) LOCK FLUSH 100 UNIT/ML IV SOLN: 250.0000 [IU] | INTRAVENOUS | Status: DC | PRN

## 2013-05-12 MED ORDER — DIPHENHYDRAMINE HCL 50 MG/ML IJ SOLN
50.0000 mg | Freq: Every day | INTRAMUSCULAR | Status: DC
  Administered 2013-05-12: 50 mg via INTRAVENOUS

## 2013-05-14 ENCOUNTER — Encounter (HOSPITAL_COMMUNITY)
Admission: RE | Admit: 2013-05-14 | Discharge: 2013-05-14 | Disposition: A | Discharge status: Home / Self Care | Payer: 59 | Source: Ambulatory Visit | Attending: Internal Medicine | Admitting: Internal Medicine

## 2013-05-14 LAB — BASIC METABOLIC PANEL
BUN: 24 mg/dL — ABNORMAL HIGH (ref 6–23)
CHLORIDE: 101 meq/L (ref 96–112)
CO2: 26 mEq/L (ref 19–32)
CREATININE: 1.46 mg/dL — AB (ref 0.50–1.35)
Calcium: 9 mg/dL (ref 8.4–10.5)
GFR calc non Af Amer: 51 mL/min — ABNORMAL LOW (ref >90)
GFR, EST AFRICAN AMERICAN: 59 mL/min — AB (ref >90)
Glucose, Bld: 155 mg/dL — ABNORMAL HIGH (ref 70–99)
POTASSIUM: 3.5 meq/L — AB (ref 3.7–5.3)
Sodium: 143 mEq/L (ref 137–147)

## 2013-05-14 MED ORDER — MEPERIDINE HCL 25 MG/ML IJ SOLN
INTRAMUSCULAR | Status: AC
  Administered 2013-05-14: 25 mg via INTRAVENOUS
  Filled 2013-05-14: qty 1

## 2013-05-14 MED ORDER — ACETAMINOPHEN 325 MG PO TABS
ORAL_TABLET | ORAL | Status: AC
  Filled 2013-05-14: qty 2

## 2013-05-14 MED ORDER — HEPARIN SOD (PORK) LOCK FLUSH 100 UNIT/ML IV SOLN: 250.0000 [IU] | Freq: Every day | INTRAVENOUS | Status: DC

## 2013-05-14 MED ORDER — DIPHENHYDRAMINE HCL 50 MG/ML IJ SOLN
50.0000 mg | Freq: Every day | INTRAMUSCULAR | Status: DC
  Administered 2013-05-14: 50 mg via INTRAVENOUS

## 2013-05-14 MED ORDER — MEPERIDINE HCL 25 MG/ML IJ SOLN
25.0000 mg | Freq: Every day | INTRAMUSCULAR | Status: DC
  Administered 2013-05-14: 25 mg via INTRAVENOUS

## 2013-05-14 MED ORDER — SODIUM CHLORIDE 0.9 % IV SOLN
Freq: Two times a day (BID) | INTRAVENOUS | Status: DC | PRN
  Administered 2013-05-14: 500 mL via INTRAVENOUS
  Administered 2013-05-14: 09:00:00 via INTRAVENOUS

## 2013-05-14 MED ORDER — DEXTROSE 5 % IV SOLN: Freq: Every day | INTRAVENOUS | Status: DC

## 2013-05-14 MED ORDER — DIPHENHYDRAMINE HCL 50 MG/ML IJ SOLN
INTRAMUSCULAR | Status: AC
  Administered 2013-05-14: 50 mg via INTRAVENOUS
  Filled 2013-05-14: qty 1

## 2013-05-14 MED ORDER — DEXTROSE 5 % IV SOLN
300.0000 mg | Freq: Every day | INTRAVENOUS | Status: DC
  Administered 2013-05-14: 300 mg via INTRAVENOUS
  Filled 2013-05-14: qty 300

## 2013-05-14 MED ORDER — ACETAMINOPHEN 325 MG PO TABS
650.0000 mg | ORAL_TABLET | Freq: Every day | ORAL | Status: DC
  Administered 2013-05-14: 650 mg via ORAL

## 2013-05-14 MED ORDER — HEPARIN SOD (PORK) LOCK FLUSH 100 UNIT/ML IV SOLN: 250.0000 [IU] | INTRAVENOUS | Status: DC | PRN

## 2013-05-14 NOTE — Progress Notes (Signed)
Right arm dressing from PICC d/c is clean, dry, intact. Surrounding area flat and within normal limits. Patient verbalizes instructions that he was given from the IV nurse. Wife at side.

## 2013-05-19 ENCOUNTER — Encounter (HOSPITAL_COMMUNITY): Payer: Self-pay

## 2013-05-21 ENCOUNTER — Telehealth: Payer: Self-pay | Admitting: Hematology & Oncology

## 2013-05-21 NOTE — Telephone Encounter (Signed)
Pt called made 5-14 appointment

## 2013-05-28 ENCOUNTER — Ambulatory Visit: Payer: Self-pay | Admitting: Internal Medicine

## 2013-05-28 ENCOUNTER — Ambulatory Visit (INDEPENDENT_AMBULATORY_CARE_PROVIDER_SITE_OTHER): Payer: 59 | Admitting: Internal Medicine

## 2013-05-28 ENCOUNTER — Encounter: Payer: Self-pay | Admitting: Internal Medicine

## 2013-05-28 ENCOUNTER — Telehealth: Payer: Self-pay | Admitting: *Deleted

## 2013-05-28 VITALS — BP 143/81 | HR 74 | Temp 98.4°F | Ht 72.0 in | Wt 215.0 lb

## 2013-05-28 DIAGNOSIS — B552 Mucocutaneous leishmaniasis: Secondary | ICD-10-CM

## 2013-05-28 NOTE — Assessment & Plan Note (Signed)
Seems to be improving.  Amphotericin still in system and healing.    He should follow up in about 6 weeks with Dr. Jenell Milliner for repeat exam, if still doing well (not worsening ulceration), he could then follow up at 3 month intervals for a year, unless otherwise indicated.   He can follow up with me on a PRN basis.

## 2013-05-28 NOTE — Telephone Encounter (Signed)
Faxed today's office notes to Dr. Jenell Milliner at [facility].

## 2013-05-28 NOTE — Progress Notes (Signed)
   Subjective:    Patient ID: Glenn Wheeler, male    DOB: 06-Oct-1955, 58 y.o.   MRN: [id number]  HPI Here for follow up of mucocutaneous leishmaniasis.  He has completed 21 days of liposomal amphotericin for L. Braziliensis.  He did have an elevation of creat but is back to normal.  Had labs with his PCP and creat 1.2 (his report) and K, Mg, Ca all normal.  Still with some tremors or twitching of extremities, particularly the right thumb. Started on metoprolol for this and seems to help.  Equates it to amphotericin effects.  Seen by ENT, Dr. Jenell Milliner, and examined.  Less inflammation, no granulation tissue.  Seems to be improved.     Review of Systems  Constitutional: Negative for fever and fatigue.  HENT:       Still with congestion, less blood clots, overall feels better but still stuffy  Gastrointestinal: Negative for nausea and diarrhea.  Musculoskeletal:       Shaky  Skin: Negative for rash.  Neurological: Negative for dizziness.       Objective:   Physical Exam  Constitutional: He appears well-developed and well-nourished. No distress.  Musculoskeletal:  Twitching of right thumb  Skin: No rash noted.  Psychiatric: He has a normal mood and affect.          Assessment & Plan:

## 2013-06-05 ENCOUNTER — Ambulatory Visit (INDEPENDENT_AMBULATORY_CARE_PROVIDER_SITE_OTHER): Payer: 59 | Admitting: Neurology

## 2013-06-05 ENCOUNTER — Encounter: Payer: Self-pay | Admitting: Neurology

## 2013-06-05 VITALS — BP 136/83 | HR 86 | Ht 72.5 in | Wt 218.0 lb

## 2013-06-05 DIAGNOSIS — R251 Tremor, unspecified: Secondary | ICD-10-CM | POA: Insufficient documentation

## 2013-06-05 DIAGNOSIS — R259 Unspecified abnormal involuntary movements: Secondary | ICD-10-CM | POA: Insufficient documentation

## 2013-06-05 MED ORDER — TRIHEXYPHENIDYL HCL 2 MG PO TABS: ORAL_TABLET | ORAL | Status: DC

## 2013-06-05 NOTE — Progress Notes (Signed)
[facility]    Provider:  Dr Janann Colonel Referring Provider: Debbrah Alar, NP Primary Care Physician:  Nance Pear., NP  CC:  tremor  HPI:  Glenn Wheeler is a 58 y.o. male here as a referral from Dr. Inda Castle for tremor evaluation.  Patient recently completed  course of IV amphotericin for  cutaneous leishmanias. Symptoms started during course of IV amphotericin, had total of 21 day course. This course was complicated by hypokalemia and acute renal injury which has been improved. Initially saw PCP for tremor, started on metoprolol 25mg  TID which has given him minimal benefit. Tremor varies throughout the day and on a day to day basis.   Tremor described as both a rest and action/postural tremor. Can be made worse with stress and anxiety. Has not noted any change in his handwriting. No noted bradykinesia, no difficulty with fine motor tasks. No difficulty with walking, no gait instability. No prior tremor history.  Patient has mild family history of essential tremor.    Review of Systems: Out of a complete 14 system review, the patient complains of only the following symptoms, and all other reviewed systems are negative. + tremor, decreased energy  History   Social History  . Marital Status: Married    Spouse Name: N/A    Number of Children: N/A  . Years of Education: N/A   Occupational History  . Not on file.   Social History Main Topics  . Smoking status: Former Research scientist (life sciences)  . Smokeless tobacco: Never Used     Comment: quit 1984 1/2 ppd for 4 years  . Alcohol Use: No  . Drug Use: No  . Sexual Activity: Not on file   Other Topics Concern  . Not on file   Social History Narrative  . No narrative on file    Family History  Problem Relation Age of Onset  . Prostate cancer Father     alive  . Hyperlipidemia Mother   . Hypertension Mother   . Diabetes Mother   . Stroke Paternal Grandfather   . Heart attack Neg Hx   . Sudden death Neg  Hx   . Clotting disorder Father     blood clots  . Stroke Maternal Grandfather     Past Medical History  Diagnosis Date  . History of chicken pox     childhood  . Hyperlipidemia   . Colon polyps     2001, 2007  . Pulmonary embolus   . Leishmaniasis 05-2011    Past Surgical History  Procedure Laterality Date  . Tendon repair  2005    right peroneal tendon-[facility]  . Rotator cuff repair  2007    left rotator cuff-[facility] [place]  . Cataract extraction, bilateral  2009    [facility]    Current Outpatient Prescriptions  Medication Sig Dispense Refill  . acetaminophen (TYLENOL) 325 MG tablet Take 325-650 mg by mouth every 6 (six) hours as needed for mild pain or moderate pain (back pain).      Marland Kitchen aspirin 81 MG tablet Take 162 mg by mouth at bedtime.       . metoprolol tartrate (LOPRESSOR) 25 MG tablet Take 25 mg by mouth 3 (three) times daily.      . pravastatin (PRAVACHOL) 40 MG tablet Take 40 mg by mouth daily.      . [DISCONTINUED] fluticasone (FLONASE) 50 MCG/ACT nasal spray Place 1 spray into the nose daily.  16 g  3   No current facility-administered  medications for this visit.    Allergies as of 06/05/2013  . (No Known Allergies)    Vitals: There were no vitals taken for this visit. Last Weight:  Wt Readings from Last 1 Encounters:  05/28/13 215 lb (97.523 kg)   Last Height:   Ht Readings from Last 1 Encounters:  05/28/13 6' (1.829 m)     Physical exam: Exam: Gen: NAD, conversant Eyes: anicteric sclerae, moist conjunctivae HENT: Atraumatic, oropharynx clear Neck: Trachea midline; supple,  Lungs: CTA, no wheezing, rales, rhonic                          CV: RRR, no MRG Abdomen: Soft, non-tender;  Extremities: No peripheral edema  Skin: Normal temperature, no rash,  Psych: Appropriate affect, pleasant  Neuro: MS: AA&Ox3, appropriately interactive, normal affect   Speech: fluent w/o paraphasic error  Memory:  good recent and remote recall  CN: PERRL, EOMI no nystagmus, no ptosis, sensation intact to LT V1-V3 bilat, face symmetric, no weakness, hearing grossly intact, palate elevates symmetrically, shoulder shrug 5/5 bilat,  tongue protrudes midline, no fasiculations noted.  Motor: normal bulk and tone Strength: 5/5  In all extremities  Coord: moderate fluctuating rest tremor noted bilateral hands L>R, re-emergent hand tremor bilaterally L>R, minimal intention tremor L>R. No bradykinesia with finger taps, hand opening, foot taps. No micrographia  Reflexes: symmetrical, bilat downgoing toes  Sens: LT intact in all extremities  Gait: posture, stance, stride and arm-swing normal. Tandem gait intact. Able to walk on heels and toes. Romberg absent. Bilateral hand re-emergent tremor   Assessment:  After physical and neurologic examination, review of laboratory studies, imaging, neurophysiology testing and pre-existing records, assessment will be reviewed on the problem list.  Plan:  Treatment plan and additional workup will be reviewed under Problem List.  1)Tremor  57y/o gentleman presenting for initial evaluation of bilateral hand tremor which began during a 21-day course of IV amphotericin B for cutaneous leishmanias. Tremor appears to be predominantly a resting tremor, there are no other acute concerns at this time. Physical exam shows a moderate L>R resting tremor, re-emergent hand tremor and mild bilateral intention tremor, no other parkinsonian features are noted on exam. Differential of symptoms would include a medication induced parkinsonism vs possibility of early unmasking of a true PD related to use of the amphotericin B. Will taper off lopressor and start Artane 1mg  BID. Counseled patient on potential side effects, can titrate up as tolerated. Will check CMP and ammonia level today. Follow up as needed.   Jim Like, DO  [facility] [address] [place], [place] [address]  Phone [phone] Fax [phone]

## 2013-06-05 NOTE — Patient Instructions (Addendum)
Overall you are doing fairly well but I do want to suggest a few things today:   Remember to drink plenty of fluid, eat healthy meals and do not skip any meals. Try to eat protein with a every meal and eat a healthy snack such as fruit or nuts in between meals. Try to keep a regular sleep-wake schedule and try to exercise daily, particularly in the form of walking, 20-30 minutes a day, if you can.   Your symptoms are concerning for a medication induced parkinsonism. The good news is that the majority of these cases resolve over time. Unfortunately it can take months for all the symptoms to resolve.   As far as your medications are concerned, I would like to suggest the following: 1)Please start taking Artane 1mg  (1/2 tablet) twice a day 2)Please decrease the lopressor to 2 tablets daily for 1 week, then decrease to 1 tablet daily for 1 week and then discontinue  As far as diagnostic testing:  1)Please have some blood work checked today  Follow up as needed. Please call us with any interim questions, concerns, problems, updates or refill requests.   My clinical assistant and will answer any of your questions and relay your messages to me and also relay most of my messages to you.   Our phone number is [phone]. We also have an after hours call service for urgent matters and there is a physician on-call for urgent questions. For any emergencies you know to call 911 or go to the nearest emergency room

## 2013-06-06 DIAGNOSIS — G2119 Other drug induced secondary parkinsonism: Secondary | ICD-10-CM | POA: Insufficient documentation

## 2013-06-06 LAB — COMPREHENSIVE METABOLIC PANEL
ALT: 37 IU/L (ref 0–44)
AST: 27 IU/L (ref 0–40)
Albumin/Globulin Ratio: 2.1 (ref 1.1–2.5)
Albumin: 4.5 g/dL (ref 3.5–5.5)
Alkaline Phosphatase: 104 IU/L (ref 39–117)
BUN/Creatinine Ratio: 22 — ABNORMAL HIGH (ref 9–20)
BUN: 29 mg/dL — AB (ref 6–24)
CALCIUM: 9.2 mg/dL (ref 8.7–10.2)
CHLORIDE: 103 mmol/L (ref 97–108)
CO2: 24 mmol/L (ref 18–29)
Creatinine, Ser: 1.3 mg/dL — ABNORMAL HIGH (ref 0.76–1.27)
GFR calc Af Amer: 69 mL/min/{1.73_m2} (ref >59)
GFR, EST NON AFRICAN AMERICAN: 60 mL/min/{1.73_m2} (ref >59)
GLUCOSE: 107 mg/dL — AB (ref 65–99)
Globulin, Total: 2.1 g/dL (ref 1.5–4.5)
Potassium: 4.2 mmol/L (ref 3.5–5.2)
Sodium: 143 mmol/L (ref 134–144)
TOTAL PROTEIN: 6.6 g/dL (ref 6.0–8.5)
Total Bilirubin: 0.5 mg/dL (ref 0.0–1.2)

## 2013-06-06 LAB — AMMONIA: AMMONIA: 113 ug/dL — AB (ref 27–102)

## 2013-06-11 ENCOUNTER — Other Ambulatory Visit: Payer: Self-pay | Admitting: *Deleted

## 2013-06-11 DIAGNOSIS — I2699 Other pulmonary embolism without acute cor pulmonale: Secondary | ICD-10-CM

## 2013-06-12 ENCOUNTER — Other Ambulatory Visit (HOSPITAL_BASED_OUTPATIENT_CLINIC_OR_DEPARTMENT_OTHER): Payer: 59 | Admitting: Lab

## 2013-06-12 ENCOUNTER — Ambulatory Visit (HOSPITAL_BASED_OUTPATIENT_CLINIC_OR_DEPARTMENT_OTHER): Payer: 59 | Admitting: Hematology & Oncology

## 2013-06-12 VITALS — BP 128/74 | HR 77 | Temp 97.4°F | Resp 16 | Wt 218.0 lb

## 2013-06-12 DIAGNOSIS — I2699 Other pulmonary embolism without acute cor pulmonale: Secondary | ICD-10-CM

## 2013-06-12 DIAGNOSIS — R259 Unspecified abnormal involuntary movements: Secondary | ICD-10-CM

## 2013-06-12 LAB — COMPREHENSIVE METABOLIC PANEL
ALT: 32 U/L (ref 0–53)
AST: 26 U/L (ref 0–37)
Albumin: 4.4 g/dL (ref 3.5–5.2)
Alkaline Phosphatase: 91 U/L (ref 39–117)
BUN: 27 mg/dL — AB (ref 6–23)
CALCIUM: 8.9 mg/dL (ref 8.4–10.5)
CHLORIDE: 104 meq/L (ref 96–112)
CO2: 27 mEq/L (ref 19–32)
CREATININE: 1.35 mg/dL (ref 0.50–1.35)
Glucose, Bld: 105 mg/dL — ABNORMAL HIGH (ref 70–99)
Potassium: 3.9 mEq/L (ref 3.5–5.3)
Sodium: 140 mEq/L (ref 135–145)
Total Bilirubin: 0.9 mg/dL (ref 0.2–1.2)
Total Protein: 6.8 g/dL (ref 6.0–8.3)

## 2013-06-12 LAB — CBC WITH DIFFERENTIAL (CANCER CENTER ONLY)
BASO#: 0.1 10*3/uL (ref 0.0–0.2)
BASO%: 0.9 % (ref 0.0–2.0)
EOS%: 1.7 % (ref 0.0–7.0)
Eosinophils Absolute: 0.1 10*3/uL (ref 0.0–0.5)
HEMATOCRIT: 36.7 % — AB (ref 38.7–49.9)
HEMOGLOBIN: 12.8 g/dL — AB (ref 13.0–17.1)
LYMPH#: 1.7 10*3/uL (ref 0.9–3.3)
LYMPH%: 30.5 % (ref 14.0–48.0)
MCH: 29.4 pg (ref 28.0–33.4)
MCHC: 34.9 g/dL (ref 32.0–35.9)
MCV: 84 fL (ref 82–98)
MONO#: 0.5 10*3/uL (ref 0.1–0.9)
MONO%: 9 % (ref 0.0–13.0)
NEUT#: 3.2 10*3/uL (ref 1.5–6.5)
NEUT%: 57.9 % (ref 40.0–80.0)
Platelets: 120 10*3/uL — ABNORMAL LOW (ref 145–400)
RBC: 4.36 10*6/uL (ref 4.20–5.70)
RDW: 14 % (ref 11.1–15.7)
WBC: 5.5 10*3/uL (ref 4.0–10.0)

## 2013-06-12 NOTE — Progress Notes (Signed)
Hematology and Oncology Follow Up Visit  Glenn Wheeler [id number] [birth year] 58 y.o. 06/12/2013   Principle Diagnosis:   Idiopathic pulmonary embolism  Mucocutaneous leishmaniasis    Current Therapy:   Aspirin 162mg  daily     Interim History:  Mr.  Wheeler is back for followup. Last him back in November. Enforcing, he now has very significant tremors which he developed after taking liposomal amphotericin B for mucocutaneous leishmaniasis. He completed this about 3 weeks ago. His tremors are quite significant. They are mostly resting tremors. He is on medication to try to help with this.  As far as clotting is concerned, there's not been any issues with this. He has had no problems with respect to DVT or pulmonary embolism.  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 Medications: Current outpatient prescriptions:acetaminophen (TYLENOL) 325 MG tablet, Take 325-650 mg by mouth every 6 (six) hours as needed for mild pain or moderate pain (back pain)., Disp: , Rfl: ;  aspirin 81 MG tablet, Take 162 mg by mouth at bedtime. , Disp: , Rfl: ;  atorvastatin (LIPITOR) 40 MG tablet, Take 40 mg by mouth at bedtime., Disp: , Rfl: ;  metoprolol tartrate (LOPRESSOR) 25 MG tablet, Take 25 mg by mouth 3 (three) times daily., Disp: , Rfl:  trihexyphenidyl (ARTANE) 2 MG tablet, Dispense 2mg  tablets: take 1/2 tablet po bid, Disp: 30 tablet, Rfl: 6;  pravastatin (PRAVACHOL) 40 MG tablet, Take 40 mg by mouth daily., Disp: , Rfl: ;  [DISCONTINUED] fluticasone (FLONASE) 50 MCG/ACT nasal spray, Place 1 spray into the nose daily., Disp: 16 g, Rfl: 3  Allergies: No Known Allergies  Past Medical History, Surgical history, Social history, and Family History were reviewed and updated.  Review of Systems: As above  Physical Exam:  weight is 218 lb (98.884 kg). His oral temperature is 97.4 F (36.3 C). His blood pressure is 128/74 and his pulse is 77. His respiration is 16.   Well  developed and well nourished white woman. He does have these resting tremors. His headache head and neck exam shows no ocular or oral lesions. He is no palpable cervical or supraclavicular lymph nodes. Lungs are clear. Cardiac exam regular in rhythm with a normal S1 and S2. There are no murmurs rubs or bruits. Abdomen is soft. Has good bowel sounds. There is no fluid wave. There is no palpable liver or spleen tip. Back exam no tenderness over the spine ribs or hips. Extremities shows no clubbing cyanosis or edema. No venous cords noted in the legs. He has no swelling. Has good range of motion of his joints. Has good strength. Skin exam no rashes, ecchymosis or petechia. Neurological exam shows a resting tremors in his hands.  Lab Results  Component Value Date   WBC 5.5 06/12/2013   HGB 12.8* 06/12/2013   HCT 36.7* 06/12/2013   MCV 84 06/12/2013   PLT 120* 06/12/2013     Chemistry      Component Value Date/Time   NA 143 06/05/2013 0919   NA 143 05/14/2013 0908   K 4.2 06/05/2013 0919   CL 103 06/05/2013 0919   CO2 24 06/05/2013 0919   BUN 29* 06/05/2013 0919   BUN 24* 05/14/2013 0908   CREATININE 1.30* 06/05/2013 0919   CREATININE 1.92* 04/28/2013 1027      Component Value Date/Time   CALCIUM 9.2 06/05/2013 0919   ALKPHOS 104 06/05/2013 0919   AST 27 06/05/2013 0919   ALT  37 06/05/2013 0919   BILITOT 0.5 06/05/2013 0919         Impression and Plan: Glenn Wheeler is 58 year old woman with a history of pulmonary embolism. We did a thorough hypercoagulable workup. Everything came back normal. He was on anticoagulation for 2 years. He was on Xarelto. He did well with this. He now is on aspirin.  I don't see any problem from my point of view with respect to thromboembolic disease.  I just feel bad that he has these side effects from the amphotericin B. I just don't recall tremors been a problem with his medication from a we used a back earlier in my career.  I just don't to have to see him back in the office on a  regular schedule now. He striking the back to see [place] if he thinks is any problems or if he has any additional needs.  We will continue to pray for him.   Volanda Napoleon, MD 5/14/20159:53 AM

## 2013-06-13 ENCOUNTER — Telehealth: Payer: Self-pay | Admitting: Neurology

## 2013-06-13 NOTE — Telephone Encounter (Signed)
Call returned, all questions answered. Will continue on artane BID and increase beta blocker to 2 tablets daily

## 2013-06-13 NOTE — Telephone Encounter (Signed)
Pt is calling stating that he is taking trihexphenley 2 mg and pt decrease his dosage of metoprolol and now pt is having a lot of nervousness and anxiety. Please advise

## 2013-06-13 NOTE — Telephone Encounter (Signed)
Patient has been taking Trihexyphenieyl 2mg  and has decreased dosage on Metoprolol--patient is having a lot of nervousness and anxiety--please call.

## 2013-06-16 ENCOUNTER — Encounter: Payer: Self-pay | Admitting: Internal Medicine

## 2013-06-21 ENCOUNTER — Other Ambulatory Visit: Payer: Self-pay | Admitting: Neurology

## 2013-06-21 MED ORDER — METOPROLOL TARTRATE 25 MG PO TABS: ORAL_TABLET | ORAL | Status: DC

## 2013-06-21 MED ORDER — TRIHEXYPHENIDYL HCL 2 MG PO TABS: ORAL_TABLET | ORAL | Status: DC

## 2013-06-25 ENCOUNTER — Telehealth: Payer: Self-pay | Admitting: *Deleted

## 2013-06-25 NOTE — Telephone Encounter (Signed)
Returned patients call. He notes sensation of dizziness and light headed feeling upon standing. Counseled him that he should go to urgent care for further evaluation. He expressed understanding. He is currently taking metoprolol 1 tablet daily, will discontinue this medication. His tremors have resolved. Will plan to start tapering off Artane next week.

## 2013-06-25 NOTE — Telephone Encounter (Signed)
Calling for pt who after eating got up and is c/o of dizziness and light headedness.  New problem for pt.  Referred to urgent care, but they felt like they needed to speak to Dr. Janann Colonel who has been treating this pt.  Dr. Janann Colonel consulted will talk with pt.

## 2013-07-23 ENCOUNTER — Telehealth: Payer: Self-pay | Admitting: *Deleted

## 2013-07-23 ENCOUNTER — Ambulatory Visit (AMBULATORY_SURGERY_CENTER): Payer: Self-pay | Admitting: *Deleted

## 2013-07-23 VITALS — Ht 72.0 in | Wt 221.4 lb

## 2013-07-23 DIAGNOSIS — Z860100 Personal history of colon polyps, unspecified: Secondary | ICD-10-CM

## 2013-07-23 MED ORDER — NA SULFATE-K SULFATE-MG SULF 17.5-3.13-1.6 GM/177ML PO SOLN: ORAL | Status: DC

## 2013-07-23 NOTE — Telephone Encounter (Signed)
OK for direct

## 2013-07-23 NOTE — Progress Notes (Signed)
Patient denies any allergies to eggs or soy. Patient denies any problems with anesthesia/sedation. Patient denies any oxygen use at home and does not take any diet/weight loss medications. EMMI education assisgned to patient on colonoscopy, this was explained and instructions given to patient. Sent phone note to Dr.Gessner to review pt's HX.

## 2013-07-23 NOTE — Telephone Encounter (Signed)
Patient is here for direct colonoscopy on 08/06/13. Pt states he thinks previous colonoscopy was at age 58 then 5 years later (2005,2010)  both in [place], states both times benign polyps removed and he was told to repeat colonoscopy every 5 years. Patient did see Amy Esterwood,PA in 2013. Patient is no longer on blood thinner Xarelto. Patient also explained that he has been treated for parasite Leishmaniasis, last IV treatment was 05/14/13, he states he no longer has this. Patient denies any GI problems, denies any family hx colon cancer. Please review patient's hx. Okay for direct colonoscopy at [facility] at this time? Thanks,Robbin PV

## 2013-07-24 NOTE — Telephone Encounter (Signed)
Noted  

## 2013-07-30 ENCOUNTER — Encounter: Payer: Self-pay | Admitting: Internal Medicine

## 2013-08-06 ENCOUNTER — Encounter: Payer: Self-pay | Admitting: Internal Medicine

## 2013-08-06 ENCOUNTER — Ambulatory Visit (AMBULATORY_SURGERY_CENTER): Payer: 59 | Admitting: Internal Medicine

## 2013-08-06 VITALS — BP 122/84 | HR 68 | Temp 96.2°F | Resp 13 | Ht 72.0 in | Wt 221.0 lb

## 2013-08-06 DIAGNOSIS — Z860100 Personal history of colon polyps, unspecified: Secondary | ICD-10-CM

## 2013-08-06 MED ORDER — SODIUM CHLORIDE 0.9 % IV SOLN: 500.0000 mL | INTRAVENOUS | Status: DC

## 2013-08-06 NOTE — Progress Notes (Signed)
A/ox3, pleased with MAC, report to RN 

## 2013-08-06 NOTE — Patient Instructions (Addendum)
Your colonoscopy was normal!  Next routine colonoscopy in 10 years - 2025  I appreciate the opportunity to care for you. Gatha Mayer, MD, FACG   YOU HAD AN ENDOSCOPIC PROCEDURE TODAY AT [facility]: Refer to the procedure report that was given to you for any specific questions about what was found during the examination.  If the procedure report does not answer your questions, please call your gastroenterologist to clarify.  If you requested that your care partner not be given the details of your procedure findings, then the procedure report has been included in a sealed envelope for you to review at your convenience later.  YOU SHOULD EXPECT: Some feelings of bloating in the abdomen. Passage of more gas than usual.  Walking can help get rid of the air that was put into your GI tract during the procedure and reduce the bloating. If you had a lower endoscopy (such as a colonoscopy or flexible sigmoidoscopy) you may notice spotting of blood in your stool or on the toilet paper. If you underwent a bowel prep for your procedure, then you may not have a normal bowel movement for a few days.  DIET: Your first meal following the procedure should be a light meal and then it is ok to progress to your normal diet.  A half-sandwich or bowl of soup is an example of a good first meal.  Heavy or fried foods are harder to digest and may make you feel nauseous or bloated.  Likewise meals heavy in dairy and vegetables can cause extra gas to form and this can also increase the bloating.  Drink plenty of fluids but you should avoid alcoholic beverages for 24 hours.  ACTIVITY: Your care partner should take you home directly after the procedure.  You should plan to take it easy, moving slowly for the rest of the day.  You can resume normal activity the day after the procedure however you should NOT DRIVE or use heavy machinery for 24 hours (because of the sedation medicines used during the test).     SYMPTOMS TO REPORT IMMEDIATELY: A gastroenterologist can be reached at any hour.  During normal business hours, 8:30 AM to 5:00 PM Monday through Friday, call [phone].  After hours and on weekends, please call the GI answering service at [phone] who will take a message and have the physician on call contact you.   Following lower endoscopy (colonoscopy or flexible sigmoidoscopy):  Excessive amounts of blood in the stool  Significant tenderness or worsening of abdominal pains  Swelling of the abdomen that is new, acute  Fever of 100F or higher  Following upper endoscopy (EGD)  Vomiting of blood or coffee ground material  New chest pain or pain under the shoulder blades  Painful or persistently difficult swallowing  New shortness of breath  Fever of 100F or higher  Black, tarry-looking stools  FOLLOW UP: If any biopsies were taken you will be contacted by phone or by letter within the next 1-3 weeks.  Call your gastroenterologist if you have not heard about the biopsies in 3 weeks.  Our staff will call the home number listed on your records the next business day following your procedure to check on you and address any questions or concerns that you may have at that time regarding the information given to you following your procedure. This is a courtesy call and so if there is no answer at the home number and we have not  heard from you through the emergency physician on call, we will assume that you have returned to your regular daily activities without incident.  SIGNATURES/CONFIDENTIALITY: You and/or your care partner have signed paperwork which will be entered into your electronic medical record.  These signatures attest to the fact that that the information above on your After Visit Summary has been reviewed and is understood.  Full responsibility of the confidentiality of this discharge information lies with you and/or your care-partner.

## 2013-08-06 NOTE — Op Note (Addendum)
[place]  [address] [place], [id number]   COLONOSCOPY PROCEDURE REPORT  PATIENT: Glenn, Wheeler  MR#: [id number] BIRTHDATE: 04/01/1955 , 58  yrs. old GENDER: Male ENDOSCOPIST: Gatha Mayer, MD, [facility] PROCEDURE DATE:  08/06/2013 PROCEDURE:   Colonoscopy, surveillance First Screening Colonoscopy - Avg.  risk and is 50 yrs.  old or older - No.  Prior Negative Screening - Now for repeat screening. N/A  History of Adenoma - Now for follow-up colonoscopy & has been > or = to 3 yrs.  N/A  Polyps Removed Today? No.  Recommend repeat exam, <10 yrs? No. ASA CLASS:   Class II INDICATIONS:Patient's personal history of colon polyps. MEDICATIONS: Propofol (Diprivan) 280 mg IV, MAC sedation, administered by CRNA, and These medications were titrated to patient response per physician's verbal order  DESCRIPTION OF PROCEDURE:   After the risks benefits and alternatives of the procedure were thoroughly explained, informed consent was obtained.  A digital rectal exam revealed no abnormalities of the rectum, A digital rectal exam revealed the prostate was not enlarged, and A digital rectal exam revealed no prostatic nodules.   The [id number]  endoscope was introduced through the anus and advanced to the cecum, which was identified by both the appendix and ileocecal valve. No adverse events experienced.   The quality of the prep was excellent using Suprep  The instrument was then slowly withdrawn as the colon was fully examined.      COLON FINDINGS: A normal appearing cecum, ileocecal valve, and appendiceal orifice were identified.  The ascending, hepatic flexure, transverse, splenic flexure, descending, sigmoid colon and rectum appeared unremarkable.  No polyps or cancers were seen.   A right colon retroflexion was performed.  Retroflexed views revealed no abnormalities. The time to cecum=2 minutes 01 seconds. Withdrawal time=8 minutes 45 seconds.  The scope  was withdrawn and the procedure completed. COMPLICATIONS: There were no complications.  ENDOSCOPIC IMPRESSION: Normal colonoscopy - excellent prep - hx hyperllastic polyps in past per patient (last colonoscopy 2007)  RECOMMENDATIONS: Repeat colonoscopy 10 years - 2025   eSigned:  Gatha Mayer, MD, [facility] 08/06/2013 10:32 AM   cc:          The Patient and Nilda Simmer, MD

## 2013-08-07 ENCOUNTER — Telehealth: Payer: Self-pay | Admitting: *Deleted

## 2013-08-07 NOTE — Telephone Encounter (Signed)
  Follow up Call-  Call back number 08/06/2013  Post procedure Call Back phone  # [phone]  Permission to leave phone message Yes     Patient questions:  Do you have a fever, pain , or abdominal swelling? No. Pain Score  0 *  Have you tolerated food without any problems? Yes.    Have you been able to return to your normal activities? Yes.    Do you have any questions about your discharge instructions: Diet   No. Medications  No. Follow up visit  No.  Do you have questions or concerns about your Care? No.  Actions: * If pain score is 4 or above: No action needed, pain <4.

## 2013-11-21 ENCOUNTER — Telehealth: Payer: Self-pay | Admitting: Neurology

## 2014-01-16 DIAGNOSIS — I872 Venous insufficiency (chronic) (peripheral): Secondary | ICD-10-CM | POA: Insufficient documentation

## 2016-04-05 DIAGNOSIS — M79644 Pain in right finger(s): Secondary | ICD-10-CM | POA: Diagnosis not present

## 2016-04-27 DIAGNOSIS — S63650A Sprain of metacarpophalangeal joint of right index finger, initial encounter: Secondary | ICD-10-CM | POA: Diagnosis not present

## 2016-05-19 DIAGNOSIS — Z719 Counseling, unspecified: Secondary | ICD-10-CM | POA: Diagnosis not present

## 2016-05-26 DIAGNOSIS — Z719 Counseling, unspecified: Secondary | ICD-10-CM | POA: Diagnosis not present

## 2016-06-02 DIAGNOSIS — Z719 Counseling, unspecified: Secondary | ICD-10-CM | POA: Diagnosis not present

## 2016-06-08 DIAGNOSIS — S63650D Sprain of metacarpophalangeal joint of right index finger, subsequent encounter: Secondary | ICD-10-CM | POA: Diagnosis not present

## 2016-06-09 DIAGNOSIS — Z719 Counseling, unspecified: Secondary | ICD-10-CM | POA: Diagnosis not present

## 2016-06-16 DIAGNOSIS — Z719 Counseling, unspecified: Secondary | ICD-10-CM | POA: Diagnosis not present

## 2016-06-23 DIAGNOSIS — Z719 Counseling, unspecified: Secondary | ICD-10-CM | POA: Diagnosis not present

## 2016-06-30 DIAGNOSIS — Z719 Counseling, unspecified: Secondary | ICD-10-CM | POA: Diagnosis not present

## 2016-07-07 DIAGNOSIS — Z719 Counseling, unspecified: Secondary | ICD-10-CM | POA: Diagnosis not present

## 2016-07-17 DIAGNOSIS — Z719 Counseling, unspecified: Secondary | ICD-10-CM | POA: Diagnosis not present

## 2016-07-21 DIAGNOSIS — Z719 Counseling, unspecified: Secondary | ICD-10-CM | POA: Diagnosis not present

## 2016-07-28 DIAGNOSIS — Z719 Counseling, unspecified: Secondary | ICD-10-CM | POA: Diagnosis not present

## 2016-08-04 DIAGNOSIS — Z719 Counseling, unspecified: Secondary | ICD-10-CM | POA: Diagnosis not present

## 2016-08-11 DIAGNOSIS — Z719 Counseling, unspecified: Secondary | ICD-10-CM | POA: Diagnosis not present

## 2016-10-25 DIAGNOSIS — Z0000 Encounter for general adult medical examination without abnormal findings: Secondary | ICD-10-CM | POA: Diagnosis not present

## 2016-10-25 DIAGNOSIS — Z125 Encounter for screening for malignant neoplasm of prostate: Secondary | ICD-10-CM | POA: Diagnosis not present

## 2016-10-25 DIAGNOSIS — E7800 Pure hypercholesterolemia, unspecified: Secondary | ICD-10-CM | POA: Diagnosis not present

## 2016-11-01 ENCOUNTER — Encounter: Payer: Self-pay | Admitting: Hematology & Oncology

## 2016-11-01 NOTE — Progress Notes (Unsigned)
Faxed medical records to: [facility] P: [phone] F: [phone]      COPY SCANNED

## 2017-04-24 DIAGNOSIS — E7800 Pure hypercholesterolemia, unspecified: Secondary | ICD-10-CM | POA: Diagnosis not present

## 2017-04-26 DIAGNOSIS — Z0000 Encounter for general adult medical examination without abnormal findings: Secondary | ICD-10-CM | POA: Diagnosis not present

## 2017-04-26 DIAGNOSIS — L570 Actinic keratosis: Secondary | ICD-10-CM | POA: Diagnosis not present

## 2017-04-26 DIAGNOSIS — E7800 Pure hypercholesterolemia, unspecified: Secondary | ICD-10-CM | POA: Diagnosis not present

## 2017-10-25 DIAGNOSIS — E7800 Pure hypercholesterolemia, unspecified: Secondary | ICD-10-CM | POA: Diagnosis not present

## 2017-10-25 DIAGNOSIS — Z0000 Encounter for general adult medical examination without abnormal findings: Secondary | ICD-10-CM | POA: Diagnosis not present

## 2017-10-25 DIAGNOSIS — Z125 Encounter for screening for malignant neoplasm of prostate: Secondary | ICD-10-CM | POA: Diagnosis not present

## 2017-11-01 DIAGNOSIS — Z0000 Encounter for general adult medical examination without abnormal findings: Secondary | ICD-10-CM | POA: Diagnosis not present

## 2018-02-13 DIAGNOSIS — L570 Actinic keratosis: Secondary | ICD-10-CM | POA: Diagnosis not present

## 2018-04-26 DIAGNOSIS — Z86711 Personal history of pulmonary embolism: Secondary | ICD-10-CM | POA: Diagnosis not present

## 2018-06-18 DIAGNOSIS — R350 Frequency of micturition: Secondary | ICD-10-CM | POA: Diagnosis not present

## 2018-06-18 DIAGNOSIS — R079 Chest pain, unspecified: Secondary | ICD-10-CM | POA: Diagnosis not present

## 2018-06-19 DIAGNOSIS — D1801 Hemangioma of skin and subcutaneous tissue: Secondary | ICD-10-CM | POA: Diagnosis not present

## 2018-06-19 DIAGNOSIS — L570 Actinic keratosis: Secondary | ICD-10-CM | POA: Diagnosis not present

## 2018-06-19 DIAGNOSIS — D485 Neoplasm of uncertain behavior of skin: Secondary | ICD-10-CM | POA: Diagnosis not present

## 2018-06-19 DIAGNOSIS — D0439 Carcinoma in situ of skin of other parts of face: Secondary | ICD-10-CM | POA: Diagnosis not present

## 2018-06-19 DIAGNOSIS — D225 Melanocytic nevi of trunk: Secondary | ICD-10-CM | POA: Diagnosis not present

## 2018-06-20 ENCOUNTER — Other Ambulatory Visit: Payer: Self-pay

## 2018-06-20 ENCOUNTER — Ambulatory Visit (INDEPENDENT_AMBULATORY_CARE_PROVIDER_SITE_OTHER): Payer: Medicare Other | Admitting: Cardiology

## 2018-06-20 ENCOUNTER — Encounter: Payer: Self-pay | Admitting: Cardiology

## 2018-06-20 VITALS — BP 131/73 | HR 69 | Temp 96.1°F | Ht 72.0 in | Wt 191.1 lb

## 2018-06-20 DIAGNOSIS — Z8679 Personal history of other diseases of the circulatory system: Secondary | ICD-10-CM | POA: Diagnosis not present

## 2018-06-20 DIAGNOSIS — R079 Chest pain, unspecified: Secondary | ICD-10-CM | POA: Insufficient documentation

## 2018-06-20 DIAGNOSIS — R002 Palpitations: Secondary | ICD-10-CM | POA: Diagnosis not present

## 2018-06-20 MED ORDER — NITROGLYCERIN 0.4 MG SL SUBL: 0.4000 mg | SUBLINGUAL_TABLET | SUBLINGUAL | 2 refills | Status: DC | PRN

## 2018-06-20 NOTE — Progress Notes (Signed)
Patient referred by Delilah Shan, MD for chest pain.  Subjective:   Glenn Wheeler, male    DOB: May 11, 1955, 63 y.o.   MRN: [id number]   Chief Complaint  Patient presents with  . New Patient (Initial Visit)  . Chest Pain  . Bradycardia    HPI  63 y.o. Caucasian male with hypertension, hyperlipidemia, h/o Leishmaniasis in 2013, history of PE in 2014, h/o mitral valve prolapse, former smoker, referred for evaluation of chest pain and palpitations.  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  He is fairly active and walks at least 11,000 steps every day without any symptoms at baseline.  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  He experienced chest tightness with this activity, that relieved only a few seconds after resting.  He has noticed similar episode while climbing a flight of stairs rapidly, again lasting for few seconds, and resolved with rest.  He denies any shortness of breath.  He has history of mitral valve prolapse diagnosed around 1990.  Ever since, he has felt frequent palpitations that are short lasting but occur throughout the day at times.  He denies any presyncope or syncope.  He has occasional leg swelling especially on his right leg.  But he never had diagnosis of DVT, he has been told that he has venous insufficiency.  He will compression stockings in the past, but does not currently wear them.   Past Medical History:  Diagnosis Date  . Colon polyps    2001, 2007  . History of chicken pox    childhood  . Hyperlipidemia   . Leishmaniasis 05-2011  . Pulmonary embolus      Past Surgical History:  Procedure Laterality Date  . CATARACT EXTRACTION, BILATERAL  2009   [place]  [phone]   polyps removed both times  . ROTATOR CUFF REPAIR  2007   left rotator cuff-[facility] [place]  . TENDON  REPAIR  2005   right peroneal tendon-[facility]     Social History   Socioeconomic History  . Marital status: Married    Spouse name: Not on file  . Number of children: Not on file  . Years of education: Not on file  . Highest education level: Not on file  Occupational History  . Not on file  Social Needs  . Financial resource strain: Not on file  . Food insecurity:    Worry: Not on file    Inability: Not on file  . Transportation needs:    Medical: Not on file    Non-medical: Not on file  Tobacco Use  . Smoking status: Former Research scientist (life sciences)  . Smokeless tobacco: Never Used  . Tobacco comment: quit 1984 1/2 ppd for 4 years  Substance and Sexual Activity  . Alcohol use: No  . Drug use: No  . Sexual activity: Not on file  Lifestyle  . Physical activity:    Days per week: Not on file    Minutes per session: Not on file  . Stress: Not on file  Relationships  . Social connections:    Talks on phone: Not on file    Gets together: Not on file    Attends religious service: Not on file    Active member of club or organization: Not on file    Attends meetings of clubs or organizations: Not  on file    Relationship status: Not on file  . Intimate partner violence:    Fear of current or ex partner: Not on file    Emotionally abused: Not on file    Physically abused: Not on file    Forced sexual activity: Not on file  Other Topics Concern  . Not on file  Social History Narrative   Married, 3 children   Right handed   Masters degree   1 cup daily     Family History  Problem Relation Age of Onset  . Prostate cancer Father        alive  . Clotting disorder Father        blood clots  . Hyperlipidemia Mother   . Hypertension Mother   . Diabetes Mother   . Stroke Paternal Grandfather   . Heart attack Neg Hx   . Sudden death Neg Hx   . Colon cancer Neg Hx   . Stroke Maternal Grandfather      Current Outpatient Medications on File Prior to Visit  Medication Sig Dispense  Refill  . acetaminophen (TYLENOL) 325 MG tablet Take 325-650 mg by mouth every 6 (six) hours as needed for mild pain or moderate pain (back pain).    Marland Kitchen aspirin 81 MG tablet Take 162 mg by mouth at bedtime.     Marland Kitchen atorvastatin (LIPITOR) 40 MG tablet Take 40 mg by mouth at bedtime.    . metoprolol tartrate (LOPRESSOR) 25 MG tablet 1 tab in the morning    . [DISCONTINUED] fluticasone (FLONASE) 50 MCG/ACT nasal spray Place 1 spray into the nose daily. 16 g 3   No current facility-administered medications on file prior to visit.     Cardiovascular studies:  EKG 06/18/2018: Sinus rhythm 56 bpm.  Normal EKG.  Recent labs: 10/25/2017: Glucose 97.  BUN/creatinine 26/1.11.  EGFR 70.  Sodium 146, potassium 4.3 H/H 14/43.  MCV 88.  Platelets 112. Cholesterol 122, triglycerides 84, HDL 39, LDL 66  Review of Systems  Constitution: Negative for decreased appetite, malaise/fatigue, weight gain and weight loss.  HENT: Negative for congestion.   Eyes: Negative for visual disturbance.  Cardiovascular: Positive for chest pain and palpitations. Negative for dyspnea on exertion, leg swelling and syncope.  Respiratory: Negative for cough.   Endocrine: Negative for cold intolerance.  Hematologic/Lymphatic: Does not bruise/bleed easily.  Skin: Negative for itching and rash.  Musculoskeletal: Negative for myalgias.  Gastrointestinal: Negative for abdominal pain, nausea and vomiting.  Genitourinary: Negative for dysuria.  Neurological: Negative for dizziness and weakness.  Psychiatric/Behavioral: The patient is not nervous/anxious.   All other systems reviewed and are negative.        Vitals:   06/20/18 1054  BP: 131/73  Pulse: 69  Temp: (!) 96.1 F (35.6 C)  SpO2: 97%    Objective:   Physical Exam  Constitutional: He is oriented to person, place, and time. He appears well-developed and well-nourished. No distress.  HENT:  Head: Normocephalic and atraumatic.  Eyes: Pupils are equal, round,  and reactive to light. Conjunctivae are normal.  Neck: No JVD present.  Cardiovascular: Normal rate, regular rhythm and intact distal pulses.  Pulmonary/Chest: Effort normal and breath sounds normal. He has no wheezes. He has no rales.  Abdominal: Soft. Bowel sounds are normal. There is no rebound.  Musculoskeletal:        General: No edema.  Lymphadenopathy:    He has no cervical adenopathy.  Neurological: He is alert and oriented to  person, place, and time. No cranial nerve deficit.  Skin: Skin is warm and dry.  Psychiatric: He has a normal mood and affect.  Nursing note and vitals reviewed.         Assessment & Recommendations:   63 y.o. Caucasian male with hypertension, hyperlipidemia, h/o Leishmaniasis in 2013, history of PE in 2014, h/o mitral valve prolapse, former smoker, referred for evaluation of chest pain and palpitations.  1. Exertional chest pain Possibly angina, although symptoms lasting for few seconds only is unusual for angina. I will obtain nuclear stress testing. Unless high risk findings noted, recommend medical management alone, given the infrequent nature of his symptoms. Prescribed SL NTG for as needed use. Continue Aspirin, statin.  2. Palpitations Recommend 2 week event monitor.   3. H/O mitral valve prolapse Will obtain baseline echocardiogram.  I will see him for virtual visit follow up after the above tests.   Thank you for referring the patient to [place]. Please feel free to contact with any questions.  Nigel Mormon, MD [facility] [place] Pager: [phone] Office: [phone] If no answer Cell [phone]

## 2018-07-03 DIAGNOSIS — D0439 Carcinoma in situ of skin of other parts of face: Secondary | ICD-10-CM | POA: Diagnosis not present

## 2018-07-03 DIAGNOSIS — L570 Actinic keratosis: Secondary | ICD-10-CM | POA: Diagnosis not present

## 2018-07-08 ENCOUNTER — Other Ambulatory Visit: Payer: Self-pay

## 2018-07-08 ENCOUNTER — Ambulatory Visit (INDEPENDENT_AMBULATORY_CARE_PROVIDER_SITE_OTHER): Payer: Medicare Other

## 2018-07-08 ENCOUNTER — Ambulatory Visit: Payer: Medicare Other

## 2018-07-08 DIAGNOSIS — R079 Chest pain, unspecified: Secondary | ICD-10-CM | POA: Diagnosis not present

## 2018-07-08 NOTE — Progress Notes (Unsigned)
Patient is here for nuclear stress test, in view of COVID-19 precautions, patient was recommended Lexiscan Myoview stress test by me today.  We will try to avoid treadmill stress testing.

## 2018-07-09 ENCOUNTER — Ambulatory Visit: Payer: Medicare Other

## 2018-07-09 DIAGNOSIS — R002 Palpitations: Secondary | ICD-10-CM

## 2018-07-09 DIAGNOSIS — Z125 Encounter for screening for malignant neoplasm of prostate: Secondary | ICD-10-CM | POA: Diagnosis not present

## 2018-07-09 DIAGNOSIS — E7800 Pure hypercholesterolemia, unspecified: Secondary | ICD-10-CM | POA: Diagnosis not present

## 2018-07-10 ENCOUNTER — Telehealth: Payer: Self-pay | Admitting: Cardiology

## 2018-07-10 MED ORDER — METOPROLOL SUCCINATE ER 25 MG PO TB24: 25.0000 mg | ORAL_TABLET | Freq: Every day | ORAL | 2 refills | Status: DC

## 2018-07-10 NOTE — Telephone Encounter (Signed)
Lexiscan Sestamibi Stress Test 07/08/2018: Stress EKG is non-diagnostic, as this is pharmacological stress test. Mild degree large extent perfusion consistent with moderate ischemia located in the apical inferior wall, mid inferior wall, mid inferoseptal wall, basal inferior wall and basal inferoseptal wall (Right Coronary Artery region) of left ventricle. Hypokinesis of the apical inferior wall, apical septal wall, mid inferior wall, mid inferoseptal wall, basal inferior wall and basal inferoseptal wall of the left ventricle.  Stress LV EF: 48%.  Intermediate risk study. Clinical correlation recommended.  Discussed the results of stress test with the patient. Will start metoprolol succinate 25 mg daily. I will discuss echocardiogram and upcoming echocardiogram and event monitor on 07/15 visit, preferably virtual visit.  Thanks MJP

## 2018-07-12 ENCOUNTER — Other Ambulatory Visit: Payer: Self-pay

## 2018-07-12 MED ORDER — METOPROLOL SUCCINATE ER 25 MG PO TB24: 25.0000 mg | ORAL_TABLET | Freq: Every day | ORAL | 2 refills | Status: DC

## 2018-07-12 MED ORDER — METOPROLOL SUCCINATE ER 25 MG PO TB24: 25.0000 mg | ORAL_TABLET | Freq: Every day | ORAL | 1 refills | Status: DC

## 2018-07-16 ENCOUNTER — Telehealth: Payer: Self-pay

## 2018-07-16 DIAGNOSIS — Z86711 Personal history of pulmonary embolism: Secondary | ICD-10-CM | POA: Diagnosis not present

## 2018-07-16 DIAGNOSIS — Z823 Family history of stroke: Secondary | ICD-10-CM | POA: Diagnosis not present

## 2018-07-16 DIAGNOSIS — B552 Mucocutaneous leishmaniasis: Secondary | ICD-10-CM | POA: Diagnosis not present

## 2018-07-16 DIAGNOSIS — R351 Nocturia: Secondary | ICD-10-CM | POA: Diagnosis not present

## 2018-07-16 DIAGNOSIS — E7800 Pure hypercholesterolemia, unspecified: Secondary | ICD-10-CM | POA: Diagnosis not present

## 2018-07-16 DIAGNOSIS — Z86718 Personal history of other venous thrombosis and embolism: Secondary | ICD-10-CM | POA: Diagnosis not present

## 2018-07-16 NOTE — Telephone Encounter (Signed)
[facility] called and said patient had Vtach, patient was walking at the time and felt heart flutters.

## 2018-07-16 NOTE — Telephone Encounter (Signed)
Reviewed event monitor. Patient had 7 sec NSVT. I called the patient, but went to voicemail. I spoke with his wife. Patient is at work. I will try calling again later today.

## 2018-07-18 ENCOUNTER — Other Ambulatory Visit: Payer: Self-pay

## 2018-07-18 ENCOUNTER — Ambulatory Visit (INDEPENDENT_AMBULATORY_CARE_PROVIDER_SITE_OTHER): Payer: Medicare Other | Admitting: Cardiology

## 2018-07-18 ENCOUNTER — Encounter: Payer: Self-pay | Admitting: Cardiology

## 2018-07-18 ENCOUNTER — Ambulatory Visit (INDEPENDENT_AMBULATORY_CARE_PROVIDER_SITE_OTHER): Payer: Medicare Other

## 2018-07-18 VITALS — BP 120/71 | HR 56 | Ht 72.0 in | Wt 189.2 lb

## 2018-07-18 DIAGNOSIS — R9439 Abnormal result of other cardiovascular function study: Secondary | ICD-10-CM | POA: Diagnosis not present

## 2018-07-18 DIAGNOSIS — R079 Chest pain, unspecified: Secondary | ICD-10-CM | POA: Diagnosis not present

## 2018-07-18 DIAGNOSIS — R0789 Other chest pain: Secondary | ICD-10-CM | POA: Diagnosis not present

## 2018-07-18 DIAGNOSIS — R002 Palpitations: Secondary | ICD-10-CM | POA: Diagnosis not present

## 2018-07-18 DIAGNOSIS — D691 Qualitative platelet defects: Secondary | ICD-10-CM

## 2018-07-18 DIAGNOSIS — Z8679 Personal history of other diseases of the circulatory system: Secondary | ICD-10-CM | POA: Diagnosis not present

## 2018-07-18 DIAGNOSIS — I4729 Other ventricular tachycardia: Secondary | ICD-10-CM

## 2018-07-18 DIAGNOSIS — I472 Ventricular tachycardia: Secondary | ICD-10-CM

## 2018-07-18 MED ORDER — NITROGLYCERIN 0.4 MG SL SUBL: 0.4000 mg | SUBLINGUAL_TABLET | SUBLINGUAL | 3 refills | Status: DC | PRN

## 2018-07-19 ENCOUNTER — Other Ambulatory Visit (HOSPITAL_COMMUNITY)
Admission: RE | Admit: 2018-07-19 | Discharge: 2018-07-19 | Disposition: A | Discharge status: Home / Self Care | Payer: Medicare Other | Source: Ambulatory Visit | Attending: Cardiology | Admitting: Cardiology

## 2018-07-19 ENCOUNTER — Encounter: Payer: Self-pay | Admitting: Cardiology

## 2018-07-19 DIAGNOSIS — Z1159 Encounter for screening for other viral diseases: Secondary | ICD-10-CM | POA: Diagnosis not present

## 2018-07-19 DIAGNOSIS — I4729 Other ventricular tachycardia: Secondary | ICD-10-CM | POA: Insufficient documentation

## 2018-07-19 DIAGNOSIS — D691 Qualitative platelet defects: Secondary | ICD-10-CM | POA: Insufficient documentation

## 2018-07-19 DIAGNOSIS — R9439 Abnormal result of other cardiovascular function study: Secondary | ICD-10-CM | POA: Insufficient documentation

## 2018-07-19 DIAGNOSIS — I472 Ventricular tachycardia: Secondary | ICD-10-CM | POA: Insufficient documentation

## 2018-07-19 LAB — SARS CORONAVIRUS 2 (TAT 6-24 HRS): SARS Coronavirus 2: NEGATIVE

## 2018-07-19 NOTE — Progress Notes (Signed)
Patient referred by [name], MD for chest pain.  Subjective:   Glenn Wheeler, male    DOB: July 13, 1955, 63 y.o.   MRN: [id number]   Chief Complaint  Patient presents with  . Follow-up    ECHO  . Palpitations  . Mitral Valve Prolapse    63 y.o. Caucasian male with hypertension, hyperlipidemia, h/o Leishmaniasis in 2013, history of PE in 2014, h/o mitral valve prolapse, former smoker, referred for evaluation of chest pain and palpitations.  Patient underwent echocardiogram nuclear stress test, and is currently wearing event monitor. Stress test showed mild degree large extent perfusion consistent with moderate ischemia located in the apical inferior wall, mid inferior wall, mid inferoseptal wall, basal inferior wall and basal inferoseptal wall (Right Coronary Artery region) of left ventricle. Event monitor showed one episode of 7 sec NSVT. Echocardiogram today showed normal LV function. It did not show mitral valve prolapse.   Patient continues to have episodes of exertional chest tightness, although not to the extent of his initial episode prior to his initial visit with me.    Past Medical History:  Diagnosis Date  . Chest pain   . Colon polyps    2001, 2007  . History of chicken pox    childhood  . Hyperlipidemia   . Leishmaniasis 05-2011  . Pulmonary embolus [facility])      Past Surgical History:  Procedure Laterality Date  . CATARACT EXTRACTION, BILATERAL  2009   [place]  [phone]   polyps removed both times  . ROTATOR CUFF REPAIR  2007   left rotator cuff-[facility]  . TENDON REPAIR  2005   right peroneal tendon-[facility]     Social History   Socioeconomic History  . Marital status: Married    Spouse name: Not on file  . Number of children: 3  . Years of education: Not on file  . Highest education level: Not on file  Occupational History  . Not on file  Social Needs  . Financial resource  strain: Not on file  . Food insecurity    Worry: Not on file    Inability: Not on file  . Transportation needs    Medical: Not on file    Non-medical: Not on file  Tobacco Use  . Smoking status: Former Smoker    Packs/day: 1.00    Years: 7.00    Pack years: 7.00    Types: Cigarettes    Quit date: 06/20/1983    Years since quitting: 35.1  . Smokeless tobacco: Never Used  . Tobacco comment: quit 1984 1/2 ppd for 4 years  Substance and Sexual Activity  . Alcohol use: No  . Drug use: No  . Sexual activity: Not on file  Lifestyle  . Physical activity    Days per week: Not on file    Minutes per session: Not on file  . Stress: Not on file  Relationships  . Social Herbalist on phone: Not on file    Gets together: Not on file    Attends religious service: Not on file    Active member of club or organization: Not on file    Attends meetings of clubs or organizations: Not on file    Relationship status: Not on file  . Intimate partner violence    Fear of current or ex partner: Not on file    Emotionally abused: Not on file    Physically abused: Not  on file    Forced sexual activity: Not on file  Other Topics Concern  . Not on file  Social History Narrative   Married, 3 children   Right handed   Masters degree   1 cup daily     Family History  Problem Relation Age of Onset  . Prostate cancer Father        alive  . Clotting disorder Father        blood clots  . Hyperlipidemia Mother   . Hypertension Mother   . Diabetes Mother   . Asthma Mother   . Stroke Paternal Grandfather   . Stroke Maternal Grandfather   . Heart attack Neg Hx   . Sudden death Neg Hx   . Colon cancer Neg Hx      Current Outpatient Medications on File Prior to Visit  Medication Sig Dispense Refill  . aspirin 81 MG tablet Take 162 mg by mouth at bedtime.     Marland Kitchen atorvastatin (LIPITOR) 20 MG tablet Take 20 mg by mouth at bedtime.     . metoprolol succinate (TOPROL XL) 25 MG 24 hr  tablet Take 1 tablet (25 mg total) by mouth daily. 90 tablet 1  . tamsulosin (FLOMAX) 0.4 MG CAPS capsule Take 0.4 mg by mouth at bedtime.     . [DISCONTINUED] fluticasone (FLONASE) 50 MCG/ACT nasal spray Place 1 spray into the nose daily. 16 g 3   No current facility-administered medications on file prior to visit.     Cardiovascular studies:  Event monitor 07/16/2018: 7 sec NSVT Associated symptom: Flutter sensation  Echocardiogram 07/18/2018: Low normal LV systolic function with EF 50-55%. Left ventricle cavity is normal in size. Normal global wall motion. Normal diastolic filling pattern.  Mild tricuspid regurgitation. No evidence of pulmonary hypertension.  Lexiscan Sestamibi Stress Test 07/08/2018: Stress EKG is non-diagnostic, as this is pharmacological stress test. Mild degree large extent perfusion consistent with moderate ischemia located in the apical inferior wall, mid inferior wall, mid inferoseptal wall, basal inferior wall and basal inferoseptal wall (Right Coronary Artery region) of left ventricle. Hypokinesis of the apical inferior wall, apical septal wall, mid inferior wall, mid inferoseptal wall, basal inferior wall and basal inferoseptal wall of the left ventricle.  Stress LV EF: 48%.  Intermediate risk study. Clinical correlation recommended.  EKG 06/18/2018: Sinus rhythm 56 bpm.  Normal EKG.  Recent labs: 07/09/2018: Glucose 95. BUN/Cr 33/1.1. HGDJ24. Na/K 141/4.5. Rest of the CMP normal.  H/H 15/43. MCV 90. Platelets 124 (Stable thrombocytopenia) Chol 175, TG 102, HDL 46, LDL 109.    10/25/2017: Glucose 97.  BUN/creatinine 26/1.11.  EGFR 70.  Sodium 146, potassium 4.3 H/H 14/43.  MCV 88.  Platelets 112. Cholesterol 122, triglycerides 84, HDL 39, LDL 66  Review of Systems  Constitution: Negative for decreased appetite, malaise/fatigue, weight gain and weight loss.  HENT: Negative for congestion.   Eyes: Negative for visual disturbance.  Cardiovascular:  Positive for chest pain and palpitations. Negative for dyspnea on exertion, leg swelling and syncope.  Respiratory: Negative for cough.   Endocrine: Negative for cold intolerance.  Hematologic/Lymphatic: Does not bruise/bleed easily.  Skin: Negative for itching and rash.  Musculoskeletal: Negative for myalgias.  Gastrointestinal: Negative for abdominal pain, nausea and vomiting.  Genitourinary: Negative for dysuria.  Neurological: Negative for dizziness and weakness.  Psychiatric/Behavioral: The patient is not nervous/anxious.   All other systems reviewed and are negative.        Vitals:   07/18/18 1223  BP:  120/71  Pulse: (!) 56  SpO2: 99%    Objective:   Physical Exam  Constitutional: He is oriented to person, place, and time. He appears well-developed and well-nourished. No distress.  HENT:  Head: Normocephalic and atraumatic.  Eyes: Pupils are equal, round, and reactive to light. Conjunctivae are normal.  Neck: No JVD present.  Cardiovascular: Normal rate, regular rhythm and intact distal pulses.  Pulmonary/Chest: Effort normal and breath sounds normal. He has no wheezes. He has no rales.  Abdominal: Soft. Bowel sounds are normal. There is no rebound.  Musculoskeletal:        General: No edema.  Lymphadenopathy:    He has no cervical adenopathy.  Neurological: He is alert and oriented to person, place, and time. No cranial nerve deficit.  Skin: Skin is warm and dry.  Psychiatric: He has a normal mood and affect.  Nursing note and vitals reviewed.         Assessment & Recommendations:   63 y.o. Caucasian male with hypertension, hyperlipidemia, h/o Leishmaniasis in 2013, history of PE in 2014,  former smoker, now with exertional chest pain.  1. Exertional chest pain Continued angina symptoms on medical therapy, associated with intermediate risk stress test. Also, noted to have 7 sec NSVT.  I discussed management options with the patient., including continued  medications vs medications + coronary angiography with possible intervention. Patient understands that latter does not have mortality benefit in stable angina, but may improve symptoms. Patient would like to proceed with coronary angiography and possible intervention.  Schedule for cardiac catheterization, and possible angioplasty. We discussed regarding risks, benefits, alternatives to this including stress testing, CTA and continued medical therapy. Patient wants to proceed. Understands <1-2% risk of death, stroke, MI, urgent CABG, bleeding, infection, renal failure but not limited to these.  Continue Aspirin, statin, metoprolol, SL NTG prn.  2. Mild thrombocytopenia: Stable, asymptomatic.  I had an ongoing telephone conversation with patient's wife at his request. Total time spent with patient was 45 minutes and greater than 50% of that time was spent in counseling and coordination care with the patient regarding complex decision making and discussion as state above.   Nigel Mormon, MD [facility] [place] Pager: [phone] Office: [phone] If no answer Cell [phone]

## 2018-07-22 ENCOUNTER — Other Ambulatory Visit: Payer: Self-pay

## 2018-07-22 DIAGNOSIS — I208 Other forms of angina pectoris: Secondary | ICD-10-CM

## 2018-07-22 DIAGNOSIS — I2089 Other forms of angina pectoris: Secondary | ICD-10-CM

## 2018-07-22 DIAGNOSIS — R079 Chest pain, unspecified: Secondary | ICD-10-CM

## 2018-07-22 HISTORY — DX: Other forms of angina pectoris: I20.89

## 2018-07-22 HISTORY — DX: Other forms of angina pectoris: I20.8

## 2018-07-22 MED ORDER — NITROGLYCERIN 0.4 MG SL SUBL: 0.4000 mg | SUBLINGUAL_TABLET | SUBLINGUAL | 3 refills | Status: DC | PRN

## 2018-07-23 ENCOUNTER — Ambulatory Visit (HOSPITAL_COMMUNITY)
Admission: RE | Admit: 2018-07-23 | Discharge: 2018-07-23 | Disposition: A | Discharge status: Home / Self Care | Payer: Medicare Other | Attending: Cardiology | Admitting: Cardiology

## 2018-07-23 ENCOUNTER — Other Ambulatory Visit: Payer: Medicare Other

## 2018-07-23 ENCOUNTER — Other Ambulatory Visit: Payer: Self-pay

## 2018-07-23 ENCOUNTER — Encounter (HOSPITAL_COMMUNITY)
Admission: RE | Disposition: A | Discharge status: Home / Self Care | Payer: Self-pay | Source: Home / Self Care | Attending: Cardiology

## 2018-07-23 DIAGNOSIS — I4729 Other ventricular tachycardia: Secondary | ICD-10-CM

## 2018-07-23 DIAGNOSIS — I208 Other forms of angina pectoris: Secondary | ICD-10-CM

## 2018-07-23 DIAGNOSIS — I10 Essential (primary) hypertension: Secondary | ICD-10-CM | POA: Insufficient documentation

## 2018-07-23 DIAGNOSIS — E785 Hyperlipidemia, unspecified: Secondary | ICD-10-CM | POA: Insufficient documentation

## 2018-07-23 DIAGNOSIS — I341 Nonrheumatic mitral (valve) prolapse: Secondary | ICD-10-CM | POA: Diagnosis not present

## 2018-07-23 DIAGNOSIS — Z8249 Family history of ischemic heart disease and other diseases of the circulatory system: Secondary | ICD-10-CM | POA: Insufficient documentation

## 2018-07-23 DIAGNOSIS — R9439 Abnormal result of other cardiovascular function study: Secondary | ICD-10-CM | POA: Diagnosis not present

## 2018-07-23 DIAGNOSIS — R0789 Other chest pain: Secondary | ICD-10-CM | POA: Diagnosis not present

## 2018-07-23 DIAGNOSIS — Z87891 Personal history of nicotine dependence: Secondary | ICD-10-CM | POA: Diagnosis not present

## 2018-07-23 DIAGNOSIS — D696 Thrombocytopenia, unspecified: Secondary | ICD-10-CM | POA: Diagnosis not present

## 2018-07-23 DIAGNOSIS — Z86711 Personal history of pulmonary embolism: Secondary | ICD-10-CM | POA: Insufficient documentation

## 2018-07-23 DIAGNOSIS — Z79899 Other long term (current) drug therapy: Secondary | ICD-10-CM | POA: Diagnosis not present

## 2018-07-23 DIAGNOSIS — I2584 Coronary atherosclerosis due to calcified coronary lesion: Secondary | ICD-10-CM | POA: Diagnosis not present

## 2018-07-23 DIAGNOSIS — Z823 Family history of stroke: Secondary | ICD-10-CM | POA: Diagnosis not present

## 2018-07-23 DIAGNOSIS — I25119 Atherosclerotic heart disease of native coronary artery with unspecified angina pectoris: Secondary | ICD-10-CM | POA: Diagnosis not present

## 2018-07-23 DIAGNOSIS — Z832 Family history of diseases of the blood and blood-forming organs and certain disorders involving the immune mechanism: Secondary | ICD-10-CM | POA: Insufficient documentation

## 2018-07-23 DIAGNOSIS — I472 Ventricular tachycardia: Secondary | ICD-10-CM | POA: Insufficient documentation

## 2018-07-23 DIAGNOSIS — Z7982 Long term (current) use of aspirin: Secondary | ICD-10-CM | POA: Insufficient documentation

## 2018-07-23 HISTORY — PX: LEFT HEART CATH AND CORONARY ANGIOGRAPHY: CATH118249

## 2018-07-23 HISTORY — PX: INTRAVASCULAR ULTRASOUND/IVUS: CATH118244

## 2018-07-23 LAB — CBC
HCT: 43.3 % (ref 39.0–52.0)
Hemoglobin: 14.5 g/dL (ref 13.0–17.0)
MCH: 30.3 pg (ref 26.0–34.0)
MCHC: 33.5 g/dL (ref 30.0–36.0)
MCV: 90.4 fL (ref 80.0–100.0)
Platelets: 133 10*3/uL — ABNORMAL LOW (ref 150–400)
RBC: 4.79 MIL/uL (ref 4.22–5.81)
RDW: 12.7 % (ref 11.5–15.5)
WBC: 4.5 10*3/uL (ref 4.0–10.5)
nRBC: 0 % (ref 0.0–0.2)

## 2018-07-23 LAB — BASIC METABOLIC PANEL
Anion gap: 6 (ref 5–15)
BUN: 22 mg/dL (ref 8–23)
CO2: 28 mmol/L (ref 22–32)
Calcium: 8.7 mg/dL — ABNORMAL LOW (ref 8.9–10.3)
Chloride: 106 mmol/L (ref 98–111)
Creatinine, Ser: 1.17 mg/dL (ref 0.61–1.24)
GFR calc Af Amer: >60 mL/min (ref >60)
GFR calc non Af Amer: >60 mL/min (ref >60)
Glucose, Bld: 99 mg/dL (ref 70–99)
Potassium: 4 mmol/L (ref 3.5–5.1)
Sodium: 140 mmol/L (ref 135–145)

## 2018-07-23 LAB — POCT ACTIVATED CLOTTING TIME: Activated Clotting Time: 301 seconds

## 2018-07-23 SURGERY — LEFT HEART CATH AND CORONARY ANGIOGRAPHY
Anesthesia: LOCAL

## 2018-07-23 MED ORDER — HEPARIN (PORCINE) IN NACL 1000-0.9 UT/500ML-% IV SOLN
INTRAVENOUS | Status: DC | PRN
  Administered 2018-07-23 (×2): 500 mL

## 2018-07-23 MED ORDER — IOHEXOL 350 MG/ML SOLN
INTRAVENOUS | Status: DC | PRN
  Administered 2018-07-23: 45 mL via INTRAVENOUS

## 2018-07-23 MED ORDER — SODIUM CHLORIDE 0.9 % WEIGHT BASED INFUSION: 1.0000 mL/kg/h | INTRAVENOUS | Status: DC

## 2018-07-23 MED ORDER — LABETALOL HCL 5 MG/ML IV SOLN: 10.0000 mg | INTRAVENOUS | Status: DC | PRN

## 2018-07-23 MED ORDER — ASPIRIN 81 MG PO CHEW
81.0000 mg | CHEWABLE_TABLET | ORAL | Status: AC
  Administered 2018-07-23: 13:00:00 81 mg via ORAL

## 2018-07-23 MED ORDER — HEPARIN SODIUM (PORCINE) 1000 UNIT/ML IJ SOLN
INTRAMUSCULAR | Status: AC
  Filled 2018-07-23: qty 1

## 2018-07-23 MED ORDER — SODIUM CHLORIDE 0.9 % IV SOLN: 250.0000 mL | INTRAVENOUS | Status: DC | PRN

## 2018-07-23 MED ORDER — LIDOCAINE HCL (PF) 1 % IJ SOLN
INTRAMUSCULAR | Status: AC
  Filled 2018-07-23: qty 30

## 2018-07-23 MED ORDER — SODIUM CHLORIDE 0.9% FLUSH: 3.0000 mL | INTRAVENOUS | Status: DC | PRN

## 2018-07-23 MED ORDER — HEPARIN (PORCINE) IN NACL 1000-0.9 UT/500ML-% IV SOLN
INTRAVENOUS | Status: AC
  Filled 2018-07-23: qty 1000

## 2018-07-23 MED ORDER — ASPIRIN 81 MG PO CHEW
CHEWABLE_TABLET | ORAL | Status: AC
  Administered 2018-07-23: 81 mg via ORAL
  Filled 2018-07-23: qty 1

## 2018-07-23 MED ORDER — MIDAZOLAM HCL 2 MG/2ML IJ SOLN
INTRAMUSCULAR | Status: AC
  Filled 2018-07-23: qty 2

## 2018-07-23 MED ORDER — SODIUM CHLORIDE 0.9% FLUSH: 3.0000 mL | Freq: Two times a day (BID) | INTRAVENOUS | Status: DC

## 2018-07-23 MED ORDER — DILTIAZEM HCL 30 MG PO TABS: 30.0000 mg | ORAL_TABLET | Freq: Three times a day (TID) | ORAL | 3 refills | Status: DC | PRN

## 2018-07-23 MED ORDER — ONDANSETRON HCL 4 MG/2ML IJ SOLN: 4.0000 mg | Freq: Four times a day (QID) | INTRAMUSCULAR | Status: DC | PRN

## 2018-07-23 MED ORDER — SODIUM CHLORIDE 0.9 % IV SOLN: INTRAVENOUS | Status: AC

## 2018-07-23 MED ORDER — LIDOCAINE HCL (PF) 1 % IJ SOLN
INTRAMUSCULAR | Status: DC | PRN
  Administered 2018-07-23: 2 mL

## 2018-07-23 MED ORDER — MIDAZOLAM HCL 2 MG/2ML IJ SOLN
INTRAMUSCULAR | Status: DC | PRN
  Administered 2018-07-23: 1 mg via INTRAVENOUS

## 2018-07-23 MED ORDER — ATORVASTATIN CALCIUM 40 MG PO TABS: 20.0000 mg | ORAL_TABLET | Freq: Every day | ORAL | 3 refills | Status: DC

## 2018-07-23 MED ORDER — HEPARIN SODIUM (PORCINE) 1000 UNIT/ML IJ SOLN
INTRAMUSCULAR | Status: DC | PRN
  Administered 2018-07-23: 4500 [IU] via INTRAVENOUS
  Administered 2018-07-23: 4000 [IU] via INTRAVENOUS

## 2018-07-23 MED ORDER — ACETAMINOPHEN 325 MG PO TABS: 650.0000 mg | ORAL_TABLET | ORAL | Status: DC | PRN

## 2018-07-23 MED ORDER — VERAPAMIL HCL 2.5 MG/ML IV SOLN
INTRAVENOUS | Status: DC | PRN
  Administered 2018-07-23: 5 mL via INTRA_ARTERIAL

## 2018-07-23 MED ORDER — FENTANYL CITRATE (PF) 100 MCG/2ML IJ SOLN
INTRAMUSCULAR | Status: DC | PRN
  Administered 2018-07-23: 50 ug via INTRAVENOUS

## 2018-07-23 MED ORDER — SODIUM CHLORIDE 0.9 % WEIGHT BASED INFUSION
3.0000 mL/kg/h | INTRAVENOUS | Status: AC
  Administered 2018-07-23: 3 mL/kg/h via INTRAVENOUS

## 2018-07-23 MED ORDER — FENTANYL CITRATE (PF) 100 MCG/2ML IJ SOLN
INTRAMUSCULAR | Status: AC
  Filled 2018-07-23: qty 2

## 2018-07-23 MED ORDER — HYDRALAZINE HCL 20 MG/ML IJ SOLN: 10.0000 mg | INTRAMUSCULAR | Status: DC | PRN

## 2018-07-23 SURGICAL SUPPLY — 17 items
BAG SNAP BAND KOVER 36X36 (MISCELLANEOUS) ×1 IMPLANT
CATH LAUNCHER 6FR EBU3.5 (CATHETERS) ×1 IMPLANT
CATH OPTICROSS 40MHZ (CATHETERS) ×1 IMPLANT
CATH OPTITORQUE TIG 4.0 5F (CATHETERS) ×1 IMPLANT
COVER DOME SNAP 22 D (MISCELLANEOUS) ×1 IMPLANT
DEVICE RAD COMP TR BAND LRG (VASCULAR PRODUCTS) ×1 IMPLANT
GLIDESHEATH SLEND A-KIT 6F 22G (SHEATH) ×1 IMPLANT
GUIDEWIRE INQWIRE 1.5J.035X260 (WIRE) IMPLANT
INQWIRE 1.5J .035X260CM (WIRE) ×2
KIT HEART LEFT (KITS) ×2 IMPLANT
KIT HEMO VALVE WATCHDOG (MISCELLANEOUS) ×1 IMPLANT
PACK CARDIAC CATHETERIZATION (CUSTOM PROCEDURE TRAY) ×2 IMPLANT
SHEATH PROBE COVER 6X72 (BAG) ×1 IMPLANT
SLED PULL BACK IVUS (MISCELLANEOUS) ×1 IMPLANT
TRANSDUCER W/STOPCOCK (MISCELLANEOUS) ×2 IMPLANT
TUBING CIL FLEX 10 FLL-RA (TUBING) ×2 IMPLANT
WIRE ASAHI PROWATER 180CM (WIRE) ×1 IMPLANT

## 2018-07-23 NOTE — H&P (Signed)
Office note copied for documentation     Patient referred by Glenn Gravel, MD for chest pain.  Subjective:   Glenn Wheeler, male    DOB: 16-Oct-1955, 63 y.o.   MRN: [id number]       Chief Complaint  Patient presents with  . Follow-up    ECHO  . Palpitations  . Mitral Valve Prolapse    63 y.o. Caucasian male with hypertension, hyperlipidemia, h/o Leishmaniasis in 2013, history of PE in 2014, h/o mitral valve prolapse, former smoker, referred for evaluation of chest pain and palpitations.  Patient underwent echocardiogram nuclear stress test, and is currently wearing event monitor. Stress test showed mild degree large extent perfusion consistent with moderate ischemia located in the apical inferior wall, mid inferior wall, mid inferoseptal wall, basal inferior wall and basal inferoseptal wall (Right Coronary Artery region) of left ventricle. Event monitor showed one episode of 7 sec NSVT. Echocardiogram today showed normal LV function. It did not show mitral valve prolapse.   Patient continues to have episodes of exertional chest tightness, although not to the extent of his initial episode prior to his initial visit with me.        Past Medical History:  Diagnosis Date  . Chest pain   . Colon polyps    2001, 2007  . History of chicken pox    childhood  . Hyperlipidemia   . Leishmaniasis 05-2011  . Pulmonary embolus [facility])           Past Surgical History:  Procedure Laterality Date  . CATARACT EXTRACTION, BILATERAL  2009   [place]  [phone]   polyps removed both times  . ROTATOR CUFF REPAIR  2007   left rotator cuff-[facility]  . TENDON REPAIR  2005   right peroneal tendon-[facility]     Social History        Socioeconomic History  . Marital status: Married    Spouse name: Not on file  . Number of children: 3  . Years of education: Not on file  . Highest education  level: Not on file  Occupational History  . Not on file  Social Needs  . Financial resource strain: Not on file  . Food insecurity    Worry: Not on file    Inability: Not on file  . Transportation needs    Medical: Not on file    Non-medical: Not on file  Tobacco Use  . Smoking status: Former Smoker    Packs/day: 1.00    Years: 7.00    Pack years: 7.00    Types: Cigarettes    Quit date: 06/20/1983    Years since quitting: 35.1  . Smokeless tobacco: Never Used  . Tobacco comment: quit 1984 1/2 ppd for 4 years  Substance and Sexual Activity  . Alcohol use: No  . Drug use: No  . Sexual activity: Not on file  Lifestyle  . Physical activity    Days per week: Not on file    Minutes per session: Not on file  . Stress: Not on file  Relationships  . Social Herbalist on phone: Not on file    Gets together: Not on file    Attends religious service: Not on file    Active member of club or organization: Not on file    Attends meetings of clubs or organizations: Not on file    Relationship status: Not on file  . Intimate partner violence  Fear of current or ex partner: Not on file    Emotionally abused: Not on file    Physically abused: Not on file    Forced sexual activity: Not on file  Other Topics Concern  . Not on file  Social History Narrative   Married, 3 children   Right handed   Masters degree   1 cup daily          Family History  Problem Relation Age of Onset  . Prostate cancer Father        alive  . Clotting disorder Father        blood clots  . Hyperlipidemia Mother   . Hypertension Mother   . Diabetes Mother   . Asthma Mother   . Stroke Paternal Grandfather   . Stroke Maternal Grandfather   . Heart attack Neg Hx   . Sudden death Neg Hx   . Colon cancer Neg Hx            Current Outpatient Medications on File Prior to Visit  Medication Sig Dispense Refill  . aspirin 81  MG tablet Take 162 mg by mouth at bedtime.     Marland Kitchen atorvastatin (LIPITOR) 20 MG tablet Take 20 mg by mouth at bedtime.     . metoprolol succinate (TOPROL XL) 25 MG 24 hr tablet Take 1 tablet (25 mg total) by mouth daily. 90 tablet 1  . tamsulosin (FLOMAX) 0.4 MG CAPS capsule Take 0.4 mg by mouth at bedtime.     . [DISCONTINUED] fluticasone (FLONASE) 50 MCG/ACT nasal spray Place 1 spray into the nose daily. 16 g 3   No current facility-administered medications on file prior to visit.     Cardiovascular studies:  Event monitor 07/16/2018: 7 sec NSVT Associated symptom: Flutter sensation  Echocardiogram 07/18/2018: Low normal LV systolic function with EF 50-55%. Left ventricle cavity is normal in size. Normal global wall motion. Normal diastolic filling pattern.  Mild tricuspid regurgitation. No evidence of pulmonary hypertension.  Lexiscan Sestamibi Stress Test06/08/2018: Stress EKG is non-diagnostic, as this is pharmacological stress test. Mild degree large extent perfusion consistent with moderate ischemia located in the apical inferior wall, mid inferior wall, mid inferoseptal wall, basal inferior wall and basal inferoseptal wall (Right Coronary Artery region) of left ventricle. Hypokinesis of the apical inferior wall, apical septal wall, mid inferior wall, mid inferoseptal wall, basal inferior wall and basal inferoseptal wall of the left ventricle. Stress LV EF: 48%.  Intermediate risk study. Clinical correlation recommended.  EKG 06/18/2018: Sinus rhythm 56 bpm.  Normal EKG.  Recent labs: 07/09/2018: Glucose 95. BUN/Cr 33/1.1. YHCW23. Na/K 141/4.5. Rest of the CMP normal.  H/H 15/43. MCV 90. Platelets 124 (Stable thrombocytopenia) Chol 175, TG 102, HDL 46, LDL 109.    10/25/2017: Glucose 97.  BUN/creatinine 26/1.11.  EGFR 70.  Sodium 146, potassium 4.3 H/H 14/43.  MCV 88.  Platelets 112. Cholesterol 122, triglycerides 84, HDL 39, LDL 66  Review of Systems   Constitution: Negative for decreased appetite, malaise/fatigue, weight gain and weight loss.  HENT: Negative for congestion.   Eyes: Negative for visual disturbance.  Cardiovascular: Positive for chest pain and palpitations. Negative for dyspnea on exertion, leg swelling and syncope.  Respiratory: Negative for cough.   Endocrine: Negative for cold intolerance.  Hematologic/Lymphatic: Does not bruise/bleed easily.  Skin: Negative for itching and rash.  Musculoskeletal: Negative for myalgias.  Gastrointestinal: Negative for abdominal pain, nausea and vomiting.  Genitourinary: Negative for dysuria.  Neurological: Negative for dizziness  and weakness.  Psychiatric/Behavioral: The patient is not nervous/anxious.   All other systems reviewed and are negative.           Vitals:   07/18/18 1223  BP: 120/71  Pulse: (!) 56  SpO2: 99%    Objective:   Objective   Physical Exam  Constitutional: He is oriented to person, place, and time. He appears well-developed and well-nourished. No distress.  HENT:  Head: Normocephalic and atraumatic.  Eyes: Pupils are equal, round, and reactive to light. Conjunctivae are normal.  Neck: No JVD present.  Cardiovascular: Normal rate, regular rhythm and intact distal pulses.  Pulmonary/Chest: Effort normal and breath sounds normal. He has no wheezes. He has no rales.  Abdominal: Soft. Bowel sounds are normal. There is no rebound.  Musculoskeletal:        General: No edema.  Lymphadenopathy:    He has no cervical adenopathy.  Neurological: He is alert and oriented to person, place, and time. No cranial nerve deficit.  Skin: Skin is warm and dry.  Psychiatric: He has a normal mood and affect.  Nursing note and vitals reviewed.         Assessment & Recommendations:   63 y.o. Caucasian male with hypertension, hyperlipidemia, h/o Leishmaniasis in 2013, history of PE in 2014,  former smoker, now with exertional chest pain.  1.  Exertional chest pain Continued angina symptoms on medical therapy, associated with intermediate risk stress test. Also, noted to have 7 sec NSVT.  I discussed management options with the patient., including continued medications vs medications + coronary angiography with possible intervention. Patient understands that latter does not have mortality benefit in stable angina, but may improve symptoms. Patient would like to proceed with coronary angiography and possible intervention.  Schedule for cardiac catheterization, and possible angioplasty. We discussed regarding risks, benefits, alternatives to this including stress testing, CTA and continued medical therapy. Patient wants to proceed. Understands <1-2% risk of death, stroke, MI, urgent CABG, bleeding, infection, renal failure but not limited to these.  Continue Aspirin, statin, metoprolol, SL NTG prn.  2. Mild thrombocytopenia: Stable, asymptomatic.  I had an ongoing telephone conversation with patient's wife at his request. Total time spent with patient was 45 minutes and greater than 50% of that time was spent in counseling and coordination care with the patient regarding complex decision making and discussion as state above.   Nigel Mormon, MD [facility] [place] Pager: [phone] Office: [phone] If no answer Cell [phone]

## 2018-07-23 NOTE — Discharge Instructions (Signed)
Drink plenty of fluids over next 48 hours and keep right wrist elevated at heart level for 24 hours ° °Radial Site Care ° °This sheet gives you information about how to care for yourself after your procedure. Your health care provider may also give you more specific instructions. If you have problems or questions, contact your health care provider. °What can I expect after the procedure? °After the procedure, it is common to have: °· Bruising and tenderness at the catheter insertion area. °Follow these instructions at home: °Medicines °· Take over-the-counter and prescription medicines only as told by your health care provider. °Insertion site care °· Follow instructions from your health care provider about how to take care of your insertion site. Make sure you: °? Wash your hands with soap and water before you change your bandage (dressing). If soap and water are not available, use hand sanitizer. °? Remove your dressing as told by your health care provider. In 24-48 hours °· Check your insertion site every day for signs of infection. Check for: °? Redness, swelling, or pain. °? Fluid or blood. °? Pus or a bad smell. °? Warmth. °· Do not take baths, swim, or use a hot tub until your health care provider approves. °· You may shower 24-48 hours after the procedure, or as directed by your health care provider. °? Remove the dressing and gently wash the site with plain soap and water. °? Pat the area dry with a clean towel. °? Do not rub the site. That could cause bleeding. °· Do not apply powder or lotion to the site. °Activity ° °· For 24 hours after the procedure, or as directed by your health care provider: °? Do not flex or bend the affected arm. °? Do not push or pull heavy objects with the affected arm. °? Do not drive yourself home from the hospital or clinic. You may drive 24 hours after the procedure unless your health care provider tells you not to. °? Do not operate machinery or power tools. °· Do not lift  anything that is heavier than 10 lb (4.5 kg), or the limit that you are told, until your health care provider says that it is safe. For 5 days °· Ask your health care provider when it is okay to: °? Return to work or school. °? Resume usual physical activities or sports. °? Resume sexual activity. °General instructions °· If the catheter site starts to bleed, raise your arm and put firm pressure on the site. If the bleeding does not stop, get help right away. This is a medical emergency. °· If you went home on the same day as your procedure, a responsible adult should be with you for the first 24 hours after you arrive home. °· Keep all follow-up visits as told by your health care provider. This is important. °Contact a health care provider if: °· You have a fever. °· You have redness, swelling, or yellow drainage around your insertion site. °Get help right away if: °· You have unusual pain at the radial site. °· The catheter insertion area swells very fast. °· The insertion area is bleeding, and the bleeding does not stop when you hold steady pressure on the area. °· Your arm or hand becomes pale, cool, tingly, or numb. °These symptoms may represent a serious problem that is an emergency. Do not wait to see if the symptoms will go away. Get medical help right away. Call your local emergency services (911 in the [place] Do not   drive yourself to the hospital. °Summary °· After the procedure, it is common to have bruising and tenderness at the site. °· Follow instructions from your health care provider about how to take care of your radial site wound. Check the wound every day for signs of infection. °· Do not lift anything that is heavier than 10 lb (4.5 kg), or the limit that you are told, until your health care provider says that it is safe. °This information is not intended to replace advice given to you by your health care provider. Make sure you discuss any questions you have with your health care  provider. °Document Released: 02/18/2010 Document Revised: 02/21/2017 Document Reviewed: 02/21/2017 °Elsevier Interactive Patient Education © 2019 [employer] ° °

## 2018-07-23 NOTE — Interval H&P Note (Signed)
History and Physical Interval Note:  07/23/2018 1:57 PM  Glenn Wheeler  has presented today for surgery, with the diagnosis of chest pain.  The various methods of treatment have been discussed with the patient and family. After consideration of risks, benefits and other options for treatment, the patient has consented to  Procedure(s): LEFT HEART CATH AND CORONARY ANGIOGRAPHY (N/A) as a surgical intervention.  The patient's history has been reviewed, patient examined, no change in status, stable for surgery.  I have reviewed the patient's chart and labs.  Questions were answered to the patient's satisfaction.    2016/2017 Appropriate Use Criteria for Coronary Revascularization Clinical Presentation: Diabetes Mellitus? Symptom Status? S/P CABG? Antianginal Therapy (# of long-acting drugs)? Results of Non-invasive testing? FFR/iFR results in all diseased vessels? Patient undergoing renal transplant? Patient undergoing percutaneous valve procedure (TAVR, MitraClip, Others)? Symptom Status:  Ischemic Symptoms  Non-invasive Testing:  Intermediate Risk  If no or indeterminate stress test, FFR/iFR results in all diseased vessels:  N/A  Diabetes Mellitus:  No  S/P CABG:  No  Antianginal therapy (number of long-acting drugs):  >=2  Patient undergoing renal transplant:  No  Patient undergoing percutaneous valve procedure:  No    newline 1 Vessel Disease PCI CABG  No proximal LAD involvement, No proximal left dominant LCX involvement A (8); Indication 2 M (6); Indication 2   Proximal left dominant LCX involvement A (8); Indication 5 A (8); Indication 5   Proximal LAD involvement A (8); Indication 5 A (8); Indication 5   newline 2 Vessel Disease  No proximal LAD involvement A (8); Indication 8 A (7); Indication 8   Proximal LAD involvement A (8); Indication 11 A (8); Indication 11   newline 3 Vessel Disease  Low disease complexity (e.g., focal stenoses, SYNTAX <=22) A (8); Indication 17 A (8);  Indication 17   Intermediate or high disease complexity (e.g., SYNTAX >=23) M (6); Indication 21 A (9); Indication 21   newline Left Main Disease  Isolated LMCA disease: ostial or midshaft A (7); Indication 24 A (9); Indication 24   Isolated LMCA disease: bifurcation involvement M (6); Indication 25 A (9); Indication 25   LMCA ostial or midshaft, concurrent low disease burden multivessel disease (e.g., 1-2 additional focal stenoses, SYNTAX <=22) A (7); Indication 26 A (9); Indication 26   LMCA ostial or midshaft, concurrent intermediate or high disease burden multivessel disease (e.g., 1-2 additional bifurcation stenoses, long stenoses, SYNTAX >=23) M (4); Indication 27 A (9); Indication 27   LMCA bifurcation involvement, concurrent low disease burden multivessel disease (e.g., 1-2 additional focal stenoses, SYNTAX <=22) M (6); Indication 28 A (9); Indication 28   LMCA bifurcation involvement, concurrent intermediate or high disease burden multivessel disease (e.g., 1-2 additional bifurcation stenoses, long stenoses, SYNTAX >=23) R (3); Indication 29 A (9); Indication [place]

## 2018-07-24 ENCOUNTER — Encounter (HOSPITAL_COMMUNITY): Payer: Self-pay | Admitting: Cardiology

## 2018-07-26 ENCOUNTER — Other Ambulatory Visit: Payer: Self-pay

## 2018-07-26 ENCOUNTER — Ambulatory Visit (INDEPENDENT_AMBULATORY_CARE_PROVIDER_SITE_OTHER): Payer: Medicare Other | Admitting: Cardiology

## 2018-07-26 ENCOUNTER — Encounter: Payer: Self-pay | Admitting: Cardiology

## 2018-07-26 DIAGNOSIS — I2510 Atherosclerotic heart disease of native coronary artery without angina pectoris: Secondary | ICD-10-CM | POA: Diagnosis not present

## 2018-07-26 DIAGNOSIS — I472 Ventricular tachycardia: Secondary | ICD-10-CM

## 2018-07-26 DIAGNOSIS — I25118 Atherosclerotic heart disease of native coronary artery with other forms of angina pectoris: Secondary | ICD-10-CM | POA: Insufficient documentation

## 2018-07-26 DIAGNOSIS — I4729 Other ventricular tachycardia: Secondary | ICD-10-CM

## 2018-07-26 NOTE — Progress Notes (Signed)
Patient referred by [name], MD for chest pain.  Subjective:   Glenn Wheeler, male    DOB: Sep 13, 1955, 63 y.o.   MRN: [id number]    I connected with the patient on 07/26/2018 by a video enabled telemedicine application and verified that I am speaking with the correct person using two identifiers.     I discussed the limitations of evaluation and management by telemedicine and the availability of in person appointments. The patient expressed understanding and agreed to proceed.   This visit type was conducted due to national recommendations for restrictions regarding the COVID-19 Pandemic (e.g. social distancing).  This format is felt to be most appropriate for this patient at this time.  All issues noted in this document were discussed and addressed.  No physical exam was performed (except for noted visual exam findings with Tele health visits).  The patient has consented to conduct a Tele health visit and understands insurance will be billed.    Chief Complaint  Patient presents with  . Coronary Artery Disease    63 y.o. Caucasian male with hypertension, hyperlipidemia, h/o Leishmaniasis in 2013, history of PE in 2014, h/o mitral valve prolapse, former smoker, referred for evaluation of chest pain and palpitations.  Patient underwent coronary angiogram on 07/23/2018 given complaints of chest pain, NSVT on monitor, and abnormal stress test showing RCA territory ischemia. RCA is wide open. He does have distal left main 40% calcific stenosis, with IVUS area measuring 8.2 mm2 (>6 mm2) suggesting favorable prognosis with medical therapy alone.   He has not had any recurrence of chest pain symptoms. He does not have any pain at right wrist arteriotomy site, which is healing well. Patient and his wife are planning on a trip to [place] this weekend and are aware of the precautions they need to take to avoid COVID exposure.    Past Medical History:  Diagnosis Date  . Chest pain   .  Colon polyps    2001, 2007  . History of chicken pox    childhood  . Hyperlipidemia   . Leishmaniasis 05-2011  . Pulmonary embolus [facility])      Past Surgical History:  Procedure Laterality Date  . CATARACT EXTRACTION, BILATERAL  2009   [place]  [phone]   polyps removed both times  . INTRAVASCULAR ULTRASOUND/IVUS N/A 07/23/2018   Procedure: Intravascular Ultrasound/IVUS;  Surgeon: Nigel Mormon, MD;  Location: [place] [facility];  Service: Cardiovascular;  Laterality: N/A;  . LEFT HEART CATH AND CORONARY ANGIOGRAPHY N/A 07/23/2018   Procedure: LEFT HEART CATH AND CORONARY ANGIOGRAPHY;  Surgeon: Nigel Mormon, MD;  Location: [place] [facility];  Service: Cardiovascular;  Laterality: N/A;  . ROTATOR CUFF REPAIR  2007   left rotator cuff-[facility]  . TENDON REPAIR  2005   right peroneal tendon-[facility]     Social History   Socioeconomic History  . Marital status: Married    Spouse name: Not on file  . Number of children: 3  . Years of education: Not on file  . Highest education level: Not on file  Occupational History  . Not on file  Social Needs  . Financial resource strain: Not on file  . Food insecurity    Worry: Not on file    Inability: Not on file  . Transportation needs    Medical: Not on file    Non-medical: Not on file  Tobacco Use  . Smoking status:  Former Smoker    Packs/day: 1.00    Years: 7.00    Pack years: 7.00    Types: Cigarettes    Quit date: 06/20/1983    Years since quitting: 35.1  . Smokeless tobacco: Never Used  . Tobacco comment: quit 1984 1/2 ppd for 4 years  Substance and Sexual Activity  . Alcohol use: No  . Drug use: No  . Sexual activity: Not on file  Lifestyle  . Physical activity    Days per week: Not on file    Minutes per session: Not on file  . Stress: Not on file  Relationships  . Social Herbalist on phone: Not on file    Gets together:  Not on file    Attends religious service: Not on file    Active member of club or organization: Not on file    Attends meetings of clubs or organizations: Not on file    Relationship status: Not on file  . Intimate partner violence    Fear of current or ex partner: Not on file    Emotionally abused: Not on file    Physically abused: Not on file    Forced sexual activity: Not on file  Other Topics Concern  . Not on file  Social History Narrative   Married, 3 children   Right handed   Masters degree   1 cup daily     Family History  Problem Relation Age of Onset  . Prostate cancer Father        alive  . Clotting disorder Father        blood clots  . Hyperlipidemia Mother   . Hypertension Mother   . Diabetes Mother   . Asthma Mother   . Stroke Paternal Grandfather   . Stroke Maternal Grandfather   . Heart attack Neg Hx   . Sudden death Neg Hx   . Colon cancer Neg Hx      Current Outpatient Medications on File Prior to Visit  Medication Sig Dispense Refill  . aspirin 81 MG tablet Take 162 mg by mouth at bedtime.     Marland Kitchen atorvastatin (LIPITOR) 40 MG tablet Take 0.5 tablets (20 mg total) by mouth at bedtime. 60 tablet 3  . diltiazem (CARDIZEM) 30 MG tablet Take 1 tablet (30 mg total) by mouth 3 (three) times daily as needed. Please take for episodes of palpitations/flutter sensation. 60 tablet 3  . metoprolol succinate (TOPROL XL) 25 MG 24 hr tablet Take 1 tablet (25 mg total) by mouth daily. 90 tablet 1  . nitroGLYCERIN (NITROSTAT) 0.4 MG SL tablet Place 1 tablet (0.4 mg total) under the tongue every 5 (five) minutes as needed for up to 30 days for chest pain. 90 tablet 3  . tamsulosin (FLOMAX) 0.4 MG CAPS capsule Take 0.4 mg by mouth at bedtime.     . [DISCONTINUED] fluticasone (FLONASE) 50 MCG/ACT nasal spray Place 1 spray into the nose daily. 16 g 3   No current facility-administered medications on file prior to visit.     Cardiovascular studies:  Event monitor  07/16/2018: 7 sec NSVT Associated symptom: Flutter sensation  Echocardiogram 07/18/2018: Low normal LV systolic function with EF 50-55%. Left ventricle cavity is normal in size. Normal global wall motion. Normal diastolic filling pattern.  Mild tricuspid regurgitation. No evidence of pulmonary hypertension.  Lexiscan Sestamibi Stress Test 07/08/2018: Stress EKG is non-diagnostic, as this is pharmacological stress test. Mild degree large extent perfusion consistent with  moderate ischemia located in the apical inferior wall, mid inferior wall, mid inferoseptal wall, basal inferior wall and basal inferoseptal wall (Right Coronary Artery region) of left ventricle. Hypokinesis of the apical inferior wall, apical septal wall, mid inferior wall, mid inferoseptal wall, basal inferior wall and basal inferoseptal wall of the left ventricle.  Stress LV EF: 48%.  Intermediate risk study. Clinical correlation recommended.  EKG 06/18/2018: Sinus rhythm 56 bpm.  Normal EKG.  Recent labs: 07/09/2018: Glucose 95. BUN/Cr 33/1.1. XWNP20. Na/K 141/4.5. Rest of the CMP normal.  H/H 15/43. MCV 90. Platelets 124 (Stable thrombocytopenia) Chol 175, TG 102, HDL 46, LDL 109.    10/25/2017: Glucose 97.  BUN/creatinine 26/1.11.  EGFR 70.  Sodium 146, potassium 4.3 H/H 14/43.  MCV 88.  Platelets 112. Cholesterol 122, triglycerides 84, HDL 39, LDL 66  Review of Systems  Constitution: Negative for decreased appetite, malaise/fatigue, weight gain and weight loss.  HENT: Negative for congestion.   Eyes: Negative for visual disturbance.  Cardiovascular: Positive for chest pain and palpitations. Negative for dyspnea on exertion, leg swelling and syncope.  Respiratory: Negative for cough.   Endocrine: Negative for cold intolerance.  Hematologic/Lymphatic: Does not bruise/bleed easily.  Skin: Negative for itching and rash.  Musculoskeletal: Negative for myalgias.  Gastrointestinal: Negative for abdominal pain,  nausea and vomiting.  Genitourinary: Negative for dysuria.  Neurological: Negative for dizziness and weakness.  Psychiatric/Behavioral: The patient is not nervous/anxious.   All other systems reviewed and are negative.       No vitals available today.   Objective:   Physical Exam  Constitutional: He is oriented to person, place, and time. He appears well-developed and well-nourished. No distress.  Pulmonary/Chest: Effort normal.  Neurological: He is alert and oriented to person, place, and time.  Psychiatric: He has a normal mood and affect.  Nursing note and vitals reviewed.         Assessment & Recommendations:   63 y.o. Caucasian male with hypertension, hyperlipidemia, CAD with 40% distal left main calcific stenosis with favorable IVUS area (8.2 mm2), h/o Leishmaniasis in 2013, history of PE in 2014,  former smoker, now with exertional chest pain.  1. CAD: 40% distal left main calcific stenosis with favorable IVUS area (8.2 mm2) Continue Aspirin (on 162 mg), lipitor 40 mg, metoprolol succinate 25 mg daily. He has not had to use any SL NTG.  2. NSVT: I suspect this is idiopathic given nonobstructive CAD and structurally normal heart.  He has had these symptoms for several years, but has not had any syncope event. Recommend diltiazem 30 mg for prn use.   3. Mild thrombocytopenia: Stable, asymptomatic.  I will see him back in 6 months.   Nigel Mormon, MD [facility] [place] Pager: [phone] Office: [phone] If no answer Cell [phone]

## 2018-08-01 NOTE — Telephone Encounter (Signed)
Error

## 2018-08-09 ENCOUNTER — Other Ambulatory Visit: Payer: Self-pay

## 2018-08-09 DIAGNOSIS — I2510 Atherosclerotic heart disease of native coronary artery without angina pectoris: Secondary | ICD-10-CM

## 2018-08-09 MED ORDER — METOPROLOL SUCCINATE ER 25 MG PO TB24: 25.0000 mg | ORAL_TABLET | Freq: Every day | ORAL | 1 refills | Status: DC

## 2018-08-13 ENCOUNTER — Other Ambulatory Visit: Payer: Self-pay

## 2018-08-13 DIAGNOSIS — E7800 Pure hypercholesterolemia, unspecified: Secondary | ICD-10-CM

## 2018-08-13 DIAGNOSIS — D691 Qualitative platelet defects: Secondary | ICD-10-CM

## 2018-08-13 MED ORDER — ATORVASTATIN CALCIUM 40 MG PO TABS: 20.0000 mg | ORAL_TABLET | Freq: Every day | ORAL | 3 refills | Status: DC

## 2018-08-14 ENCOUNTER — Ambulatory Visit: Payer: Medicare Other | Admitting: Cardiology

## 2018-08-15 DIAGNOSIS — M545 Low back pain: Secondary | ICD-10-CM | POA: Diagnosis not present

## 2018-08-19 ENCOUNTER — Other Ambulatory Visit: Payer: Self-pay

## 2018-08-19 ENCOUNTER — Ambulatory Visit: Payer: Medicare Other | Attending: Internal Medicine | Admitting: Physical Therapy

## 2018-08-19 ENCOUNTER — Encounter: Payer: Self-pay | Admitting: Physical Therapy

## 2018-08-19 VITALS — BP 125/72 | HR 60

## 2018-08-19 DIAGNOSIS — M545 Low back pain, unspecified: Secondary | ICD-10-CM

## 2018-08-19 DIAGNOSIS — R29898 Other symptoms and signs involving the musculoskeletal system: Secondary | ICD-10-CM

## 2018-08-19 NOTE — Therapy (Signed)
[facility] [address]  [place], [place], [address] Phone: [phone]   Fax:  [phone]  Physical Therapy Evaluation  Patient Details  Name: Glenn Wheeler MRN: [id number] Date of Birth: 18-Aug-1955 Referring Provider (PT): [name], MD   Encounter Date: 08/19/2018  PT End of Session - 08/19/18 1624    Visit Number  1    Number of Visits  9    Date for PT Re-Evaluation  09/16/18    Authorization Type  Medicare & BCBS    PT Start Time  1536    PT Stop Time  1616    PT Time Calculation (min)  40 min    Activity Tolerance  Patient tolerated treatment well;Patient limited by pain    Behavior During Therapy  [facility] for tasks assessed/performed       Past Medical History:  Diagnosis Date  . Chest pain   . Colon polyps    2001, 2007  . Exertional angina ([place]) 07/22/2018  . History of chicken pox    childhood  . Hyperlipidemia   . Leishmaniasis 05-2011  . Pulmonary embolus [facility])     Past Surgical History:  Procedure Laterality Date  . CATARACT EXTRACTION, BILATERAL  2009   [place]  [phone]   polyps removed both times  . INTRAVASCULAR ULTRASOUND/IVUS N/A 07/23/2018   Procedure: Intravascular Ultrasound/IVUS;  Surgeon: Nigel Mormon, MD;  Location: [place] [facility];  Service: Cardiovascular;  Laterality: N/A;  . LEFT HEART CATH AND CORONARY ANGIOGRAPHY N/A 07/23/2018   Procedure: LEFT HEART CATH AND CORONARY ANGIOGRAPHY;  Surgeon: Nigel Mormon, MD;  Location: [place] [facility];  Service: Cardiovascular;  Laterality: N/A;  . ROTATOR CUFF REPAIR  2007   left rotator cuff-[facility]  . TENDON REPAIR  2005   right peroneal tendon-[facility]    Vitals:   08/19/18 1538  BP: 125/72  Pulse: 60  SpO2: 96%     Subjective Assessment - 08/19/18 1538    Subjective  Patient reports LBP started about 3 weeks ago. Unsure of cause of pain  onset- his wife believes that he was sitting in a hammock chair for too long. Pain is located over central LB. Reports intermittent pain down R lateral thigh. Denies N/T. Worse with heavy lifting, picking something up from the ground, twisting the torso, sitting. Requires long periods of sitting at work. Good tolerance of standing and walking. Better with Advil.    Pertinent History  hx PE 2014, hx leishmaniasis 2013, HLD, exertional angina, R peroneal tendon repair 2005, R RTC repair 2007    Limitations  Lifting;Standing;Walking;House hold activities    How long can you sit comfortably?  2-3 minutes    How long can you stand comfortably?  unlimited    How long can you walk comfortably?  unlimited    Diagnostic tests  none    Patient Stated Goals  I would like the pain to go away    Currently in Pain?  Yes    Pain Score  4     Pain Location  Back    Pain Orientation  Lower   central LB   Pain Descriptors / Indicators  Aching;Sharp    Pain Type  Acute pain         [facility] PT Assessment - 08/19/18 1550      Assessment   Medical Diagnosis  Low back pain    Referring  Provider (PT)  [name], MD    Onset Date/Surgical Date  07/29/18    Next MD Visit  not scheduled    Prior Therapy  yes      Precautions   Precautions  None      Balance Screen   Has the patient fallen in the past 6 months  No    Has the patient had a decrease in activity level because of a fear of falling?   No    Is the patient reluctant to leave their home because of a fear of falling?   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    Leisure  none      Cognition   Overall Cognitive Status  Within Functional Limits for tasks assessed      Observation/Other Assessments   Observations  moving frequently, uncomfortable d/t pain    Focus on Therapeutic Outcomes (FOTO)   Lumbar: 52 (48% limited, 28% predicted)      Sensation   Light Touch  Appears Intact      Coordination   Gross Motor Movements are Fluid and Coordinated  Yes      Posture/Postural Control   Posture/Postural Control  Postural limitations    Postural Limitations  Rounded Shoulders;Forward head;Weight shift left      ROM / Strength   AROM / PROM / Strength  AROM;Strength      AROM   AROM Assessment Site  Lumbar    Lumbar Flexion  jt line   mild LBP   Lumbar Extension  moderately limited   mild LBP   Lumbar - Right Side Bend  distal thigh   moderate pain   Lumbar - Left Side Bend  distal thigh    Lumbar - Right Rotation  mildly limited    Lumbar - Left Rotation  mildly limited   mild pain     Strength   Strength Assessment Site  Hip;Knee;Ankle    Right/Left Hip  Right;Left    Right Hip Flexion  4+/5    Right Hip ABduction  4+/5    Right Hip ADduction  4+/5    Left Hip Flexion  4+/5    Left Hip ABduction  4+/5    Left Hip ADduction  4+/5    Right/Left Knee  Right;Left    Right Knee Flexion  4/5    Right Knee Extension  4/5    Left Knee Flexion  4/5    Left Knee Extension  4/5    Right/Left Ankle  Right;Left    Right Ankle Dorsiflexion  4/5    Right Ankle Plantar Flexion  4+/5    Left Ankle Plantar Flexion  4/5    Left Ankle Inversion  4+/5      Flexibility   Soft Tissue Assessment /Muscle Length  yes  Hamstrings  B mildly tight    Quadriceps  B WFL    Piriformis  moderately tight on R- also reporting LBP with stretching      Palpation   Spinal mobility  pain with central PA over L5    Palpation comment   mild pain in LB with pressure over R piriformis       Special Tests    Special Tests  Lumbar;Sacrolliac Tests    Lumbar Tests  Straight Leg Raise    Sacroiliac Tests   Sacral Thrust      Straight Leg Raise   Findings  Positive    Side   Right    Comment  mild LBP      Sacral thrust    Findings  Positive    Comments  localized pain with pressure over sacrum      Ambulation/Gait   Assistive device  None    Gait Pattern  Step-through pattern;Decreased weight shift to right   L hip drop   Ambulation Surface  Level;Indoor    Gait velocity  WFL                Objective measurements completed on examination: See above findings.              PT Education - 08/19/18 1624    Education Details  prognosis, POC, HEP    Person(s) Educated  Patient    Methods  Explanation;Demonstration;Tactile cues;Verbal cues;Handout    Comprehension  Verbalized understanding;Returned demonstration       PT Short Term Goals - 08/19/18 1633      PT SHORT TERM GOAL #1   Title  Patient to be independent with initial HEP.    Time  2    Period  Weeks    Status  New    Target Date  09/02/18        PT Long Term Goals - 08/19/18 1633      PT LONG TERM GOAL #1   Title  Patient to be independent with advanced HEP.    Time  4    Period  Weeks    Status  New    Target Date  09/16/18      PT LONG TERM GOAL #2   Title  Patient to demonstrate B LE strength >=4+/5.    Time  4    Period  Weeks    Status  New    Target Date  09/16/18      PT LONG TERM GOAL #3   Title  Patient to demonstrate lumbar AROM WFL and without pain limiting.    Time  4    Period  Weeks    Status  New    Target Date  09/16/18      PT LONG TERM GOAL #4   Title  Patient to report tolerance of 1 hour of sitting without pain limiting.    Time  4    Period  Weeks    Status  New    Target Date  09/16/18      PT LONG TERM GOAL #5   Title  Patient to demonstrate safe body mechanics with lifting 10lb  box without pain limiting.    Time  4    Period  Weeks    Status  New    Target Date  09/16/18             Plan - 08/19/18 1625    Clinical Impression Statement  Patient is a 63y/o M presenting to OPPT with c/o acute midline LBP of 3 weeks duration with unknown cause. Patient reports intermittent pain down R lateral thigh. Denies N/T. Worse with heavy lifting, picking something up from the ground, twisting the torso, sitting. Patient is especially limited at work, where he requires long periods of sitting. Patient today with limited and painful lumbar AROM, decreased distal LE strength, slightly positive R SLR and sacral thrust, decreased R piriformis flexibility, gait deviations, and tenderness to palpation over R piriformis and L5. Educated patient on gentle stretching and lumbopelvic ROM HEP- patient reported understanding. Would benefit from skilled PT services 2x/week for 4 weeks to address aforementioned impairments.    Personal Factors and Comorbidities  Age;Comorbidity 3+;Time since onset of injury/illness/exacerbation;Past/Current Experience    Comorbidities  hx PE 2014, hx leishmaniasis 2013, HLD, exertional angina, R peroneal tendon repair 2005, R RTC repair 2007    Examination-Activity Limitations  Sit;Sleep;Bend;Squat;Caring for Others;Carry;Lift;Reach Overhead    Examination-Participation Restrictions  Church;Cleaning;Shop;Community Activity;Driving;Yard Work;Interpersonal Relationship;Laundry    Stability/Clinical Decision Making  Evolving/Moderate complexity    Clinical Decision Making  Moderate    Rehab Potential  Good    PT Frequency  2x / week    PT Duration  4 weeks    PT Treatment/Interventions  ADLs/Self Care Home Management;Cryotherapy;Electrical Stimulation;Moist Heat;Traction;Therapeutic exercise;Therapeutic activities;Functional mobility training;Stair training;Gait training;Ultrasound;Neuromuscular re-education;Patient/family education;Manual  techniques;Taping;Splinting;Energy conservation;Dry needling;Passive range of motion    PT Next Visit Plan  reassess HEP    Consulted and Agree with Plan of Care  Patient       Patient will benefit from skilled therapeutic intervention in order to improve the following deficits and impairments:  Hypomobility, Decreased activity tolerance, Decreased strength, Pain, Decreased range of motion, Improper body mechanics, Postural dysfunction, Impaired flexibility, Abnormal gait  Visit Diagnosis: 1. Acute midline low back pain without sciatica   2. Other symptoms and signs involving the musculoskeletal system        Problem List Patient Active Problem List   Diagnosis Date Noted  . Coronary artery disease involving native coronary artery of native heart without angina pectoris 07/26/2018  . Abnormal stress test 07/19/2018  . NSVT (nonsustained ventricular tachycardia) ([place]) 07/19/2018  . Thrombocytopathia ([place]) 07/19/2018  . Exertional chest pain 06/20/2018  . Palpitations 06/20/2018  . H/O mitral valve prolapse 06/20/2018  . Tremor 06/05/2013  . Leishmaniasis, mucocutaneous 04/14/2013  . History of pulmonary embolism 04/14/2013  . Hypogonadism male 01/12/2012  . Hx of colonic polyps 01/08/2012  . Erectile dysfunction 12/03/2011  . Myalgia 08/18/2011  . External hemorrhoid 08/18/2011  . Left lateral epicondylitis 04/08/2011  . Hyperlipidemia 02/15/2011  . Obesity 02/15/2011  . Leishmaniasis, cutaneous, [place] 12/01/2010  . Pulmonary embolism ([place]) 11/28/2010     Janene Harvey, PT, DPT 08/19/18 4:37 PM   [facility] [address]  [place], [place], [address] Phone: [phone]   Fax:  [phone]  Name: WEBSTER PATRONE MRN: [id number] Date of Birth: [birth year]

## 2018-08-22 ENCOUNTER — Other Ambulatory Visit: Payer: Self-pay

## 2018-08-22 ENCOUNTER — Ambulatory Visit: Payer: Medicare Other | Admitting: Physical Therapy

## 2018-08-22 ENCOUNTER — Encounter: Payer: Self-pay | Admitting: Physical Therapy

## 2018-08-22 DIAGNOSIS — R29898 Other symptoms and signs involving the musculoskeletal system: Secondary | ICD-10-CM | POA: Diagnosis not present

## 2018-08-22 DIAGNOSIS — M545 Low back pain, unspecified: Secondary | ICD-10-CM

## 2018-08-22 NOTE — Therapy (Signed)
[facility] [address]  [place], [place], [address] Phone: [phone]   Fax:  [phone]  Physical Therapy Treatment  Patient Details  Name: Glenn Wheeler MRN: [id number] Date of Birth: [birth year] Referring Provider (PT): [name], MD   Encounter Date: 08/22/2018  PT End of Session - 08/22/18 0845    Visit Number  2    Number of Visits  9    Date for PT Re-Evaluation  09/16/18    Authorization Type  Medicare & BCBS    PT Start Time  0758    PT Stop Time  0841    PT Time Calculation (min)  43 min    Activity Tolerance  Patient tolerated treatment well    Behavior During Therapy  [employer] for tasks assessed/performed       Past Medical History:  Diagnosis Date  . Chest pain   . Colon polyps    2001, 2007  . Exertional angina ([place]) 07/22/2018  . History of chicken pox    childhood  . Hyperlipidemia   . Leishmaniasis 05-2011  . Pulmonary embolus [facility])     Past Surgical History:  Procedure Laterality Date  . CATARACT EXTRACTION, BILATERAL  2009   [place]  [phone]   polyps removed both times  . INTRAVASCULAR ULTRASOUND/IVUS N/A 07/23/2018   Procedure: Intravascular Ultrasound/IVUS;  Surgeon: Nigel Mormon, MD;  Location: [place] [facility];  Service: Cardiovascular;  Laterality: N/A;  . LEFT HEART CATH AND CORONARY ANGIOGRAPHY N/A 07/23/2018   Procedure: LEFT HEART CATH AND CORONARY ANGIOGRAPHY;  Surgeon: Nigel Mormon, MD;  Location: [place] [facility];  Service: Cardiovascular;  Laterality: N/A;  . ROTATOR CUFF REPAIR  2007   left rotator cuff-[facility]  . TENDON REPAIR  2005   right peroneal tendon-[facility]    There were no vitals filed for this visit.  Subjective Assessment - 08/22/18 0758    Subjective  Reports that he has been feeling pretty good. Pain worse as meds wear off. Denies SOB or chest tightness since his  heart cath in June.    Pertinent History  hx PE 2014, hx leishmaniasis 2013, HLD, exertional angina, R peroneal tendon repair 2005, R RTC repair 2007    Diagnostic tests  none    Patient Stated Goals  I would like the pain to go away    Currently in Pain?  Yes    Pain Score  1     Pain Location  Back    Pain Orientation  Lower    Pain Descriptors / Indicators  Aching    Pain Type  Acute pain                       [facility] Adult PT Treatment/Exercise - 08/22/18 0001      Exercises   Exercises  Lumbar;Knee/Hip      Lumbar Exercises: Stretches   Passive Hamstring Stretch  Right;Left;30 seconds;3 reps    Passive Hamstring Stretch Limitations  supine with strap    Piriformis Stretch  Right;Left;2 reps;30 seconds    Piriformis Stretch Limitations  KTOS   cues to avoid trunk rotation     Lumbar Exercises: Aerobic   Nustep  L2 x 6 min (LEs only)      Lumbar Exercises: Supine   Bridge  10 reps    Bridge Limitations  good ROM    Bridge with clamshell  10 reps   red TB around knees; isometric hip ABD bridge   Other Supine Lumbar Exercises  HS bridge x10    Other Supine Lumbar Exercises  LTR x20 to tolerance    cues to decrease speed     Lumbar Exercises: Prone   Other Prone Lumbar Exercises  prone on elbows 3"x15    cues to slow down     Lumbar Exercises: Quadruped   Madcat/Old Horse  10 reps    Madcat/Old Horse Limitations  cues for increased posterior pelvic tilt    Single Arm Raise  10 reps    Single Arm Raises Limitations  manual cues for neutral spine    Straight Leg Raise  10 reps    Straight Leg Raises Limitations  manual cues for neutral spine      Knee/Hip Exercises: Standing   Wall Squat  10 reps;2 sets    Wall Squat Limitations  cues for L wt shift             PT Education - 08/22/18 0845    Education Details  update to HEP    Person(s) Educated  Patient    Methods  Explanation;Demonstration;Tactile cues;Verbal cues;Handout    Comprehension   Verbalized understanding;Returned demonstration       PT Short Term Goals - 08/22/18 0848      PT SHORT TERM GOAL #1   Title  Patient to be independent with initial HEP.    Time  2    Period  Weeks    Status  On-going    Target Date  09/02/18        PT Long Term Goals - 08/22/18 0848      PT LONG TERM GOAL #1   Title  Patient to be independent with advanced HEP.    Time  4    Period  Weeks    Status  On-going      PT LONG TERM GOAL #2   Title  Patient to demonstrate B LE strength >=4+/5.    Time  4    Period  Weeks    Status  On-going      PT LONG TERM GOAL #3   Title  Patient to demonstrate lumbar AROM WFL and without pain limiting.    Time  4    Period  Weeks    Status  On-going      PT LONG TERM GOAL #4   Title  Patient to report tolerance of 1 hour of sitting without pain limiting.    Time  4    Period  Weeks    Status  On-going      PT LONG TERM GOAL #5   Title  Patient to demonstrate safe body mechanics with lifting 10lb box without pain limiting.    Time  4    Period  Weeks    Status  On-going            Plan - 08/22/18 0846    Clinical Impression Statement  Patient arrived to session with no new complaints. Reviewed HEP with intermittent cues for form. Patient with tendency to rush through exercises and required cues to decrease speed. Patient tolerating HEP exercises well, only reporting mild soreness in back after performing prone on elbows at home. Introduced bridges with patient demonstrating good ROM. Demonstrating more hamstring tightness on L with hamstring stretch today. Updated HEP with exercises that were well-tolerated today. Patient reported understanding and with no complaints at end of  session.    Comorbidities  hx PE 2014, hx leishmaniasis 2013, HLD, exertional angina, R peroneal tendon repair 2005, R RTC repair 2007    PT Treatment/Interventions  ADLs/Self Care Home Management;Cryotherapy;Electrical Stimulation;Moist  Heat;Traction;Therapeutic exercise;Therapeutic activities;Functional mobility training;Stair training;Gait training;Ultrasound;Neuromuscular re-education;Patient/family education;Manual techniques;Taping;Splinting;Energy conservation;Dry needling;Passive range of motion    PT Next Visit Plan  progress core and LE strengthening    Consulted and Agree with Plan of Care  Patient       Patient will benefit from skilled therapeutic intervention in order to improve the following deficits and impairments:  Hypomobility, Decreased activity tolerance, Decreased strength, Pain, Decreased range of motion, Improper body mechanics, Postural dysfunction, Impaired flexibility, Abnormal gait  Visit Diagnosis: 1. Acute midline low back pain without sciatica   2. Other symptoms and signs involving the musculoskeletal system        Problem List Patient Active Problem List   Diagnosis Date Noted  . Coronary artery disease involving native coronary artery of native heart without angina pectoris 07/26/2018  . Abnormal stress test 07/19/2018  . NSVT (nonsustained ventricular tachycardia) ([place]) 07/19/2018  . Thrombocytopathia ([place]) 07/19/2018  . Exertional chest pain 06/20/2018  . Palpitations 06/20/2018  . H/O mitral valve prolapse 06/20/2018  . Tremor 06/05/2013  . Leishmaniasis, mucocutaneous 04/14/2013  . History of pulmonary embolism 04/14/2013  . Hypogonadism male 01/12/2012  . Hx of colonic polyps 01/08/2012  . Erectile dysfunction 12/03/2011  . Myalgia 08/18/2011  . External hemorrhoid 08/18/2011  . Left lateral epicondylitis 04/08/2011  . Hyperlipidemia 02/15/2011  . Obesity 02/15/2011  . Leishmaniasis, cutaneous, American 12/01/2010  . Pulmonary embolism ([place]) 11/28/2010     Janene Harvey, PT, DPT 08/22/18 8:49 AM   [facility] [address]  [place], [place], [address] Phone: [phone]   Fax:   [phone]  Name: Glenn Wheeler MRN: [id number] Date of Birth: 12/03/1955

## 2018-08-26 ENCOUNTER — Ambulatory Visit: Payer: Medicare Other

## 2018-08-26 ENCOUNTER — Other Ambulatory Visit: Payer: Self-pay

## 2018-08-26 DIAGNOSIS — R29898 Other symptoms and signs involving the musculoskeletal system: Secondary | ICD-10-CM

## 2018-08-26 DIAGNOSIS — M545 Low back pain, unspecified: Secondary | ICD-10-CM

## 2018-08-26 NOTE — Therapy (Signed)
[facility] [address]  [place], [place], [address] Phone: [phone]   Fax:  [phone]  Physical Therapy Treatment  Patient Details  Name: Glenn Wheeler MRN: [id number] Date of Birth: 04/03/1955 Referring Provider (PT): [name], MD   Encounter Date: 08/26/2018  PT End of Session - 08/26/18 0904    Visit Number  3    Number of Visits  9    Date for PT Re-Evaluation  09/16/18    Authorization Type  Medicare & BCBS    PT Start Time  [phone]    PT Stop Time  0930    PT Time Calculation (min)  41 min    Activity Tolerance  Patient tolerated treatment well    Behavior During Therapy  [facility] for tasks assessed/performed       Past Medical History:  Diagnosis Date  . Chest pain   . Colon polyps    2001, 2007  . Exertional angina ([place]) 07/22/2018  . History of chicken pox    childhood  . Hyperlipidemia   . Leishmaniasis 05-2011  . Pulmonary embolus [facility])     Past Surgical History:  Procedure Laterality Date  . CATARACT EXTRACTION, BILATERAL  2009   [place]  [phone]   polyps removed both times  . INTRAVASCULAR ULTRASOUND/IVUS N/A 07/23/2018   Procedure: Intravascular Ultrasound/IVUS;  Surgeon: Nigel Mormon, MD;  Location: [place] [facility];  Service: Cardiovascular;  Laterality: N/A;  . LEFT HEART CATH AND CORONARY ANGIOGRAPHY N/A 07/23/2018   Procedure: LEFT HEART CATH AND CORONARY ANGIOGRAPHY;  Surgeon: Nigel Mormon, MD;  Location: [place] [facility];  Service: Cardiovascular;  Laterality: N/A;  . ROTATOR CUFF REPAIR  2007   left rotator cuff-[facility]  . TENDON REPAIR  2005   right peroneal tendon-[facility]    There were no vitals filed for this visit.  Subjective Assessment - 08/26/18 0853    Subjective  Pt. feels LBP is getting better since starting therapy.    Pertinent History  hx PE 2014, hx leishmaniasis 2013, HLD,  exertional angina, R peroneal tendon repair 2005, R RTC repair 2007    Diagnostic tests  none    Patient Stated Goals  I would like the pain to go away    Currently in Pain?  Yes    Pain Score  1     Pain Location  Back    Pain Orientation  Lower    Pain Descriptors / Indicators  Aching    Pain Type  Acute pain    Multiple Pain Sites  No                       [facility] Adult PT Treatment/Exercise - 08/26/18 0001      Lumbar Exercises: Stretches   Passive Hamstring Stretch  Right;Left;30 seconds;3 reps    Passive Hamstring Stretch Limitations  supine with strap    Piriformis Stretch  Right;Left;2 reps;30 seconds    Piriformis Stretch Limitations  KTOS    Figure 4 Stretch  Supine;1 rep;30 seconds    Figure 4 Stretch Limitations  towel assistance       Lumbar Exercises: Aerobic   Nustep  L3 x 6 min (LEs only)      Lumbar Exercises: Sidelying   Clam  Right   x 12 reps    Clam Limitations  red TB at knees  Other Sidelying Lumbar Exercises  B sidelying "open book" stretch 3" x 10 reps       Lumbar Exercises: Prone   Other Prone Lumbar Exercises  prone partial press-up 3" x 10 reps     Other Prone Lumbar Exercises  prone elbows x 30sec       Lumbar Exercises: Quadruped   Madcat/Old Horse  10 reps    Madcat/Old Horse Limitations  good motion without cueing                PT Short Term Goals - 08/22/18 0848      PT SHORT TERM GOAL #1   Title  Patient to be independent with initial HEP.    Time  2    Period  Weeks    Status  On-going    Target Date  09/02/18        PT Long Term Goals - 08/22/18 0848      PT LONG TERM GOAL #1   Title  Patient to be independent with advanced HEP.    Time  4    Period  Weeks    Status  On-going      PT LONG TERM GOAL #2   Title  Patient to demonstrate B LE strength >=4+/5.    Time  4    Period  Weeks    Status  On-going      PT LONG TERM GOAL #3   Title  Patient to demonstrate lumbar AROM WFL and without  pain limiting.    Time  4    Period  Weeks    Status  On-going      PT LONG TERM GOAL #4   Title  Patient to report tolerance of 1 hour of sitting without pain limiting.    Time  4    Period  Weeks    Status  On-going      PT LONG TERM GOAL #5   Title  Patient to demonstrate safe body mechanics with lifting 10lb box without pain limiting.    Time  4    Period  Weeks    Status  On-going            Plan - 08/26/18 0911    Clinical Impression Statement  Mr. Hagemeister doing well today.  Notes he feels his pain levels have improved since start of therapy.  Tolerated progression of lumbopelvic strengthening and ROM activities without issue.  Reports he is more mindful of need for change in positioning while sitting working at desk more since starting therapy.  Progressing well toward goals.    Personal Factors and Comorbidities  Age;Comorbidity 3+;Time since onset of injury/illness/exacerbation;Past/Current Experience    Comorbidities  hx PE 2014, hx leishmaniasis 2013, HLD, exertional angina, R peroneal tendon repair 2005, R RTC repair 2007    Rehab Potential  Good    PT Treatment/Interventions  ADLs/Self Care Home Management;Cryotherapy;Electrical Stimulation;Moist Heat;Traction;Therapeutic exercise;Therapeutic activities;Functional mobility training;Stair training;Gait training;Ultrasound;Neuromuscular re-education;Patient/family education;Manual techniques;Taping;Splinting;Energy conservation;Dry needling;Passive range of motion    PT Next Visit Plan  progress core and LE strengthening    Consulted and Agree with Plan of Care  Patient       Patient will benefit from skilled therapeutic intervention in order to improve the following deficits and impairments:  Hypomobility, Decreased activity tolerance, Decreased strength, Pain, Decreased range of motion, Improper body mechanics, Postural dysfunction, Impaired flexibility, Abnormal gait  Visit Diagnosis: 1. Acute midline low back pain  without sciatica   2.  Other symptoms and signs involving the musculoskeletal system        Problem List Patient Active Problem List   Diagnosis Date Noted  . Coronary artery disease involving native coronary artery of native heart without angina pectoris 07/26/2018  . Abnormal stress test 07/19/2018  . NSVT (nonsustained ventricular tachycardia) ([place]) 07/19/2018  . Thrombocytopathia ([place]) 07/19/2018  . Exertional chest pain 06/20/2018  . Palpitations 06/20/2018  . H/O mitral valve prolapse 06/20/2018  . Tremor 06/05/2013  . Leishmaniasis, mucocutaneous 04/14/2013  . History of pulmonary embolism 04/14/2013  . Hypogonadism male 01/12/2012  . Hx of colonic polyps 01/08/2012  . Erectile dysfunction 12/03/2011  . Myalgia 08/18/2011  . External hemorrhoid 08/18/2011  . Left lateral epicondylitis 04/08/2011  . Hyperlipidemia 02/15/2011  . Obesity 02/15/2011  . Leishmaniasis, cutaneous, American 12/01/2010  . Pulmonary embolism ([place]) 11/28/2010    Bess Harvest, PTA 08/26/18 5:53 PM    [facility] [address]  [place], [place], [address] Phone: [phone]   Fax:  [phone]  Name: Glenn Wheeler MRN: [id number] Date of Birth: 08/12/1955

## 2018-08-28 ENCOUNTER — Other Ambulatory Visit: Payer: Self-pay

## 2018-08-28 ENCOUNTER — Ambulatory Visit: Payer: Medicare Other | Admitting: Physical Therapy

## 2018-08-28 ENCOUNTER — Encounter: Payer: Self-pay | Admitting: Physical Therapy

## 2018-08-28 VITALS — BP 119/64 | HR 60

## 2018-08-28 DIAGNOSIS — R29898 Other symptoms and signs involving the musculoskeletal system: Secondary | ICD-10-CM

## 2018-08-28 DIAGNOSIS — M545 Low back pain, unspecified: Secondary | ICD-10-CM

## 2018-08-28 NOTE — Therapy (Signed)
[facility] [address]  [place], [place], [address] Phone: [phone]   Fax:  [phone]  Physical Therapy Treatment  Patient Details  Name: Glenn Wheeler MRN: [id number] Date of Birth: March 12, 1955 Referring Provider (PT): [name], MD   Encounter Date: 08/28/2018  PT End of Session - 08/28/18 0846    Visit Number  4    Number of Visits  9    Date for PT Re-Evaluation  09/16/18    Authorization Type  Medicare & BCBS    PT Start Time  5956    PT Stop Time  0841    PT Time Calculation (min)  44 min    Activity Tolerance  Patient tolerated treatment well    Behavior During Therapy  [facility] for tasks assessed/performed       Past Medical History:  Diagnosis Date  . Chest pain   . Colon polyps    2001, 2007  . Exertional angina ([place]) 07/22/2018  . History of chicken pox    childhood  . Hyperlipidemia   . Leishmaniasis 05-2011  . Pulmonary embolus [facility])     Past Surgical History:  Procedure Laterality Date  . CATARACT EXTRACTION, BILATERAL  2009   [place]  [phone]   polyps removed both times  . INTRAVASCULAR ULTRASOUND/IVUS N/A 07/23/2018   Procedure: Intravascular Ultrasound/IVUS;  Surgeon: Nigel Mormon, MD;  Location: [place] [facility];  Service: Cardiovascular;  Laterality: N/A;  . LEFT HEART CATH AND CORONARY ANGIOGRAPHY N/A 07/23/2018   Procedure: LEFT HEART CATH AND CORONARY ANGIOGRAPHY;  Surgeon: Nigel Mormon, MD;  Location: [place] [facility];  Service: Cardiovascular;  Laterality: N/A;  . ROTATOR CUFF REPAIR  2007   left rotator cuff-[facility]  . TENDON REPAIR  2005   right peroneal tendon-[facility]    Vitals:   08/28/18 0758  BP: 119/64  Pulse: 60  SpO2: 97%    Subjective Assessment - 08/28/18 0800    Subjective  Reports that he is much better. Hasn't taken pain meds for 2 days. Having minimal discomfort now  with sitting.    Pertinent History  hx PE 2014, hx leishmaniasis 2013, HLD, exertional angina, R peroneal tendon repair 2005, R RTC repair 2007    Diagnostic tests  none    Patient Stated Goals  I would like the pain to go away    Currently in Pain?  Yes    Pain Score  1    0.5/10   Pain Location  Back    Pain Orientation  Lower    Pain Descriptors / Indicators  Aching    Pain Type  Acute pain                       [facility] Adult PT Treatment/Exercise - 08/28/18 0001      Therapeutic Activites    Therapeutic Activities  Lifting    Lifting  edu and practice lifting box with proper lifting mechanics; 10x 10#, 2x 15#, 3x 20#   no pain and good form     Lumbar Exercises: Aerobic   Nustep  L3 x 6 min (LEs only)      Lumbar Exercises: Supine   Bridge with clamshell  10 reps   red TB above knees   Bridge with March  10 reps   slight hip drop on B feet     Lumbar Exercises: Sidelying  Other Sidelying Lumbar Exercises  B sidelying "open book" stretch 3" x 10 reps       Lumbar Exercises: Prone   Other Prone Lumbar Exercises  prone partial press-up 3" x 10 reps    cues to slow down and maintain hips down     Lumbar Exercises: Quadruped   Madcat/Old Horse  10 reps    Madcat/Old Horse Limitations  good form    Other Quadruped Lumbar Exercises  child pose 10x5" to tolerance      Knee/Hip Exercises: Standing   Wall Squat  1 set;15 reps    Wall Squat Limitations  symmetrical wt shift    Other Standing Knee Exercises  sidestepping with red TB around toes 2x53ft   cues to avoid lateral trunk lean   Other Standing Knee Exercises  paloff press with green TB x10 each side             PT Education - 08/28/18 0846    Education Details  update to HEP    Person(s) Educated  Patient    Methods  Explanation;Demonstration;Tactile cues;Verbal cues;Handout    Comprehension  Verbalized understanding;Returned demonstration       PT Short Term Goals - 08/28/18 0849       PT SHORT TERM GOAL #1   Title  Patient to be independent with initial HEP.    Time  2    Period  Weeks    Status  Achieved    Target Date  09/02/18        PT Long Term Goals - 08/22/18 0848      PT LONG TERM GOAL #1   Title  Patient to be independent with advanced HEP.    Time  4    Period  Weeks    Status  On-going      PT LONG TERM GOAL #2   Title  Patient to demonstrate B LE strength >=4+/5.    Time  4    Period  Weeks    Status  On-going      PT LONG TERM GOAL #3   Title  Patient to demonstrate lumbar AROM WFL and without pain limiting.    Time  4    Period  Weeks    Status  On-going      PT LONG TERM GOAL #4   Title  Patient to report tolerance of 1 hour of sitting without pain limiting.    Time  4    Period  Weeks    Status  On-going      PT LONG TERM GOAL #5   Title  Patient to demonstrate safe body mechanics with lifting 10lb box without pain limiting.    Time  4    Period  Weeks    Status  On-going            Plan - 08/28/18 0847    Clinical Impression Statement  Patient arrived to session with report of good improvement in LBP since starting PT. Now only with remaining discomfort with sitting. Tolerated gentle lumbopelvic ROM- patient now able to demonstrate good ROM with partial press up after being given cues to maintain hips down on the mat. Initiated marching bridge to increase glute strengthening challenge- patient demonstrating slight hip drop on B LEs d/t remaining weakness. Educated patient on proper lifting mechanics and trialed lifting boxes weighing 10lbs, 15lbs, and 20lbs. Patient was able to demonstrate neutral spine with lifting boxes of all weights today and reported no  pain in LB. Updated HEP with exercises that were well-tolerated today. Patient reported understanding. Patient progressing well towards goals.    Personal Factors and Comorbidities  Age;Comorbidity 3+;Time since onset of injury/illness/exacerbation;Past/Current Experience     Comorbidities  hx PE 2014, hx leishmaniasis 2013, HLD, exertional angina, R peroneal tendon repair 2005, R RTC repair 2007    Rehab Potential  Good    PT Treatment/Interventions  ADLs/Self Care Home Management;Cryotherapy;Electrical Stimulation;Moist Heat;Traction;Therapeutic exercise;Therapeutic activities;Functional mobility training;Stair training;Gait training;Ultrasound;Neuromuscular re-education;Patient/family education;Manual techniques;Taping;Splinting;Energy conservation;Dry needling;Passive range of motion    PT Next Visit Plan  progress core and LE strengthening    Consulted and Agree with Plan of Care  Patient       Patient will benefit from skilled therapeutic intervention in order to improve the following deficits and impairments:  Hypomobility, Decreased activity tolerance, Decreased strength, Pain, Decreased range of motion, Improper body mechanics, Postural dysfunction, Impaired flexibility, Abnormal gait  Visit Diagnosis: 1. Acute midline low back pain without sciatica   2. Other symptoms and signs involving the musculoskeletal system        Problem List Patient Active Problem List   Diagnosis Date Noted  . Coronary artery disease involving native coronary artery of native heart without angina pectoris 07/26/2018  . Abnormal stress test 07/19/2018  . NSVT (nonsustained ventricular tachycardia) ([place]) 07/19/2018  . Thrombocytopathia ([place]) 07/19/2018  . Exertional chest pain 06/20/2018  . Palpitations 06/20/2018  . H/O mitral valve prolapse 06/20/2018  . Tremor 06/05/2013  . Leishmaniasis, mucocutaneous 04/14/2013  . History of pulmonary embolism 04/14/2013  . Hypogonadism male 01/12/2012  . Hx of colonic polyps 01/08/2012  . Erectile dysfunction 12/03/2011  . Myalgia 08/18/2011  . External hemorrhoid 08/18/2011  . Left lateral epicondylitis 04/08/2011  . Hyperlipidemia 02/15/2011  . Obesity 02/15/2011  . Leishmaniasis, cutaneous, American 12/01/2010  .  Pulmonary embolism ([place]) 11/28/2010     Janene Harvey, PT, DPT 08/28/18 8:51 AM   [facility] [address]  [place], [place], [address] Phone: [phone]   Fax:  [phone]  Name: Glenn Wheeler MRN: [id number] Date of Birth: 21-Sep-1955

## 2018-08-29 ENCOUNTER — Encounter: Payer: BLUE CROSS/BLUE SHIELD | Admitting: Physical Therapy

## 2018-09-02 ENCOUNTER — Other Ambulatory Visit: Payer: Self-pay

## 2018-09-02 ENCOUNTER — Ambulatory Visit: Payer: Medicare Other | Attending: Internal Medicine

## 2018-09-02 DIAGNOSIS — R29898 Other symptoms and signs involving the musculoskeletal system: Secondary | ICD-10-CM | POA: Diagnosis not present

## 2018-09-02 DIAGNOSIS — M545 Low back pain, unspecified: Secondary | ICD-10-CM

## 2018-09-02 NOTE — Therapy (Signed)
[facility] [address]  [place], [place], [address] Phone: [phone]   Fax:  [phone]  Physical Therapy Treatment  Patient Details  Name: Glenn Wheeler MRN: [id number] Date of Birth: 1955/11/06 Referring Provider (PT): [name], MD   Encounter Date: 09/02/2018  PT End of Session - 09/02/18 0806    Visit Number  5    Number of Visits  9    Date for PT Re-Evaluation  09/16/18    Authorization Type  Medicare & BCBS    PT Start Time  0800    PT Stop Time  0839    PT Time Calculation (min)  39 min    Activity Tolerance  Patient tolerated treatment well    Behavior During Therapy  [facility] for tasks assessed/performed       Past Medical History:  Diagnosis Date  . Chest pain   . Colon polyps    2001, 2007  . Exertional angina ([place]) 07/22/2018  . History of chicken pox    childhood  . Hyperlipidemia   . Leishmaniasis 05-2011  . Pulmonary embolus [facility])     Past Surgical History:  Procedure Laterality Date  . CATARACT EXTRACTION, BILATERAL  2009   [place]  [phone]   polyps removed both times  . INTRAVASCULAR ULTRASOUND/IVUS N/A 07/23/2018   Procedure: Intravascular Ultrasound/IVUS;  Surgeon: Nigel Mormon, MD;  Location: [place] [facility];  Service: Cardiovascular;  Laterality: N/A;  . LEFT HEART CATH AND CORONARY ANGIOGRAPHY N/A 07/23/2018   Procedure: LEFT HEART CATH AND CORONARY ANGIOGRAPHY;  Surgeon: Nigel Mormon, MD;  Location: [place] [facility];  Service: Cardiovascular;  Laterality: N/A;  . ROTATOR CUFF REPAIR  2007   left rotator cuff-[facility]  . TENDON REPAIR  2005   right peroneal tendon-[facility]    There were no vitals filed for this visit.  Subjective Assessment - 09/02/18 0803    Subjective  Pt. reporting some increased LBP after painting and outdoor work over weekend at [employer].    Pertinent History   hx PE 2014, hx leishmaniasis 2013, HLD, exertional angina, R peroneal tendon repair 2005, R RTC repair 2007    Currently in Pain?  Yes    Pain Score  1     Pain Location  Back    Pain Orientation  Lower;Medial    Pain Descriptors / Indicators  Aching    Pain Type  Acute pain    Pain Frequency  Constant    Aggravating Factors   overdoing it with yardwork and painting over weekend.                       [place] Adult PT Treatment/Exercise - 09/02/18 0001      Lumbar Exercises: Stretches   Lumbar Stabilization Level 1  3 reps;30 seconds    Lumbar Stabilization Level 1 Limitations  3-way prayer stretch       Lumbar Exercises: Aerobic   Nustep  L4 x 7 min (LEs only)      Lumbar Exercises: Machines for Strengthening   Cybex Knee Extension  B LEs: 25# x 15 resp     Cybex Knee Flexion  B LEs: 20# x 15 reps       Lumbar Exercises: Supine   Dead Bug  10 reps;3 seconds    Dead Bug Limitations  Alternating UE/LE      Lumbar Exercises:  Sidelying   Other Sidelying Lumbar Exercises  B sidelying "open book" stretch 5" x 10 reps; top LE extended out at 90 dg hip flexion on table to intensify stretch       Lumbar Exercises: Prone   Other Prone Lumbar Exercises  prone press-up 3" x 10 reps       Knee/Hip Exercises: Standing   Hip Flexion  Right;Left;10 reps;Knee straight;Stengthening    Hip Flexion Limitations  red TB at ankle; 1 ski pole    Hip ADduction  Right;Left;10 reps;Strengthening    Hip ADduction Limitations  red TB at ankle; 1 ski pole      Hip Abduction  Right;Left;10 reps;Knee straight;Stengthening    Abduction Limitations  red TB at ankle; 1 ski pole    Hip Extension  Right;Left;Knee straight;Stengthening;10 reps    Extension Limitations  red TB at ankle; 1 ski pole              PT Education - 09/02/18 1013    Education Details  HEP update; deadbug    Person(s) Educated  Patient    Methods  Explanation;Demonstration;Verbal cues;Handout    Comprehension   Verbalized understanding;Returned demonstration;Verbal cues required       PT Short Term Goals - 08/28/18 0849      PT SHORT TERM GOAL #1   Title  Patient to be independent with initial HEP.    Time  2    Period  Weeks    Status  Achieved    Target Date  09/02/18        PT Long Term Goals - 08/22/18 0848      PT LONG TERM GOAL #1   Title  Patient to be independent with advanced HEP.    Time  4    Period  Weeks    Status  On-going      PT LONG TERM GOAL #2   Title  Patient to demonstrate B LE strength >=4+/5.    Time  4    Period  Weeks    Status  On-going      PT LONG TERM GOAL #3   Title  Patient to demonstrate lumbar AROM WFL and without pain limiting.    Time  4    Period  Weeks    Status  On-going      PT LONG TERM GOAL #4   Title  Patient to report tolerance of 1 hour of sitting without pain limiting.    Time  4    Period  Weeks    Status  On-going      PT LONG TERM GOAL #5   Title  Patient to demonstrate safe body mechanics with lifting 10lb box without pain limiting.    Time  4    Period  Weeks    Status  On-going            Plan - 09/02/18 0806    Clinical Impression Statement  Mr. Ketchum continues to report improvement with physical therapy.  Reports he has self-progressed his prone press up to full ROM without lower back irritation.  Able to progress proximal lumbopelvic strengthening and stability activities today without increased LBP.  Reports he performed heavy yardwork and painting at daughter's house over weekend without significant "flare-up" and progressing well toward goals.    Personal Factors and Comorbidities  Age;Comorbidity 3+;Time since onset of injury/illness/exacerbation;Past/Current Experience    Comorbidities  hx PE 2014, hx leishmaniasis 2013, HLD, exertional angina, R peroneal tendon  repair 2005, R RTC repair 2007    Rehab Potential  Good    PT Treatment/Interventions  ADLs/Self Care Home Management;Cryotherapy;Electrical  Stimulation;Moist Heat;Traction;Therapeutic exercise;Therapeutic activities;Functional mobility training;Stair training;Gait training;Ultrasound;Neuromuscular re-education;Patient/family education;Manual techniques;Taping;Splinting;Energy conservation;Dry needling;Passive range of motion    PT Next Visit Plan  progress core and LE strengthening    Consulted and Agree with Plan of Care  Patient       Patient will benefit from skilled therapeutic intervention in order to improve the following deficits and impairments:  Hypomobility, Decreased activity tolerance, Decreased strength, Pain, Decreased range of motion, Improper body mechanics, Postural dysfunction, Impaired flexibility, Abnormal gait  Visit Diagnosis: 1. Acute midline low back pain without sciatica   2. Other symptoms and signs involving the musculoskeletal system        Problem List Patient Active Problem List   Diagnosis Date Noted  . Coronary artery disease involving native coronary artery of native heart without angina pectoris 07/26/2018  . Abnormal stress test 07/19/2018  . NSVT (nonsustained ventricular tachycardia) ([place]) 07/19/2018  . Thrombocytopathia ([place]) 07/19/2018  . Exertional chest pain 06/20/2018  . Palpitations 06/20/2018  . H/O mitral valve prolapse 06/20/2018  . Tremor 06/05/2013  . Leishmaniasis, mucocutaneous 04/14/2013  . History of pulmonary embolism 04/14/2013  . Hypogonadism male 01/12/2012  . Hx of colonic polyps 01/08/2012  . Erectile dysfunction 12/03/2011  . Myalgia 08/18/2011  . External hemorrhoid 08/18/2011  . Left lateral epicondylitis 04/08/2011  . Hyperlipidemia 02/15/2011  . Obesity 02/15/2011  . Leishmaniasis, cutaneous, American 12/01/2010  . Pulmonary embolism ([place]) 11/28/2010    Bess Harvest, PTA 09/02/18 12:40 PM    [facility] [address]  [place], [place], [address] Phone: [phone]   Fax:   [phone]  Name: MORT SMELSER MRN: [id number] Date of Birth: 02-Mar-1955

## 2018-09-05 ENCOUNTER — Ambulatory Visit: Payer: Medicare Other

## 2018-09-05 ENCOUNTER — Other Ambulatory Visit: Payer: Self-pay

## 2018-09-05 DIAGNOSIS — M545 Low back pain, unspecified: Secondary | ICD-10-CM

## 2018-09-05 DIAGNOSIS — H00015 Hordeolum externum left lower eyelid: Secondary | ICD-10-CM | POA: Diagnosis not present

## 2018-09-05 DIAGNOSIS — R29898 Other symptoms and signs involving the musculoskeletal system: Secondary | ICD-10-CM | POA: Diagnosis not present

## 2018-09-05 NOTE — Therapy (Addendum)
[facility] [address]  [place], [place], [address] Phone: [phone]   Fax:  [phone]  Physical Therapy Treatment  Patient Details  Name: Glenn Wheeler MRN: [id number] Date of Birth: 26-Oct-1955 Referring Provider (PT): [name], MD   Progress Note Reporting Period 08/19/18 to 09/16/18  See note below for Objective Data and Assessment of Progress/Goals.    Encounter Date: 09/05/2018  PT End of Session - 09/05/18 0940    Visit Number  6    Number of Visits  9    Date for PT Re-Evaluation  09/16/18    Authorization Type  Medicare & BCBS    PT Start Time  0933    PT Stop Time  1015    PT Time Calculation (min)  42 min    Activity Tolerance  Patient tolerated treatment well    Behavior During Therapy  WFL for tasks assessed/performed       Past Medical History:  Diagnosis Date  . Chest pain   . Colon polyps    2001, 2007  . Exertional angina ([place]) 07/22/2018  . History of chicken pox    childhood  . Hyperlipidemia   . Leishmaniasis 05-2011  . Pulmonary embolus [facility])     Past Surgical History:  Procedure Laterality Date  . CATARACT EXTRACTION, BILATERAL  2009   [place]  [phone]   polyps removed both times  . INTRAVASCULAR ULTRASOUND/IVUS N/A 07/23/2018   Procedure: Intravascular Ultrasound/IVUS;  Surgeon: Nigel Mormon, MD;  Location: [place] [facility];  Service: Cardiovascular;  Laterality: N/A;  . LEFT HEART CATH AND CORONARY ANGIOGRAPHY N/A 07/23/2018   Procedure: LEFT HEART CATH AND CORONARY ANGIOGRAPHY;  Surgeon: Nigel Mormon, MD;  Location: [place] [facility];  Service: Cardiovascular;  Laterality: N/A;  . ROTATOR CUFF REPAIR  2007   left rotator cuff-[facility]  . TENDON REPAIR  2005   right peroneal tendon-[facility]    There were no vitals filed for this visit.  Subjective Assessment - 09/05/18 0939    Subjective  Pt. would like to make today his last day of therapy.    Pertinent History  hx PE 2014, hx leishmaniasis 2013, HLD, exertional angina, R peroneal tendon repair 2005, R RTC repair 2007    Diagnostic tests  none    Patient Stated Goals  I would like the pain to go away    Currently in Pain?  No/denies    Pain Score  0-No pain    Multiple Pain Sites  No         [facility] PT Assessment - 09/05/18 0001      AROM   AROM Assessment Site  Lumbar    Lumbar Flexion  mid shin     Lumbar Extension  25% limited     Lumbar - Right Side Bend  WFL - just past joint line     Lumbar - Left Side Bend  WFL - just past joint line     Lumbar - Right Rotation  WFL    Lumbar - Left Rotation  [facility]      Strength   Strength Assessment Site  Hip;Knee;Ankle    Right/Left Hip  Right;Left    Right Hip Flexion  4+/5    Right Hip ABduction  5/5    Right Hip ADduction  4+/5    Left Hip Flexion  4+/5    Left Hip ABduction  4+/5    Left Hip ADduction  5/5    Right/Left Knee  Right;Left    Right Knee Flexion  4+/5    Right Knee Extension  5/5    Left Knee Flexion  4+/5    Left Knee Extension  5/5    Right/Left Ankle  Right;Left    Right Ankle Dorsiflexion  4+/5    Left Ankle Plantar Flexion  4+/5    Left Ankle Inversion  4+/5                   [facility] Adult PT Treatment/Exercise - 09/05/18 0001      Self-Care   Self-Care  Other Self-Care Comments;Lifting    Lifting  Discussed proper lifting technique from floor to hip height as to reduce lumbar strain; pt. verbalized understanding and briefly able to demo proper tehcnique     Other Self-Care Comments   Comprehensive HEP reviewed and updated with pt. pt. verbalized understanding       Lumbar Exercises: Stretches   Passive Hamstring Stretch  Right;Left;30 seconds;1 rep    Passive Hamstring Stretch Limitations  strap    Piriformis Stretch  Right;Left;2 reps;30 seconds    Piriformis Stretch Limitations  KTOS    Figure 4 Stretch   Supine;1 rep;30 seconds    Figure 4 Stretch Limitations  towel assistance       Lumbar Exercises: Aerobic   Nustep  L4 x 7 min (LEs only)      Lumbar Exercises: Supine   Bridge with Ball Squeeze  10 reps;3 seconds   2 sets      Lumbar Exercises: Quadruped   Opposite Arm/Leg Raise  Right arm/Left leg;Left arm/Right leg;10 reps      Knee/Hip Exercises: Stretches   Gastroc Stretch  Right;Left;1 rep;30 seconds    Gastroc Stretch Limitations  into wall              PT Education - 09/05/18 1233    Education Details  HEP update    Person(s) Educated  Patient    Methods  Explanation;Demonstration;Verbal cues;Handout    Comprehension  Verbalized understanding;Returned demonstration;Verbal cues required       PT Short Term Goals - 08/28/18 0849      PT SHORT TERM GOAL #1   Title  Patient to be independent with initial HEP.    Time  2    Period  Weeks    Status  Achieved    Target Date  09/02/18        PT Long Term Goals - 09/05/18 3893      PT LONG TERM GOAL #1   Title  Patient to be independent with advanced HEP.    Time  4    Period  Weeks    Status  Achieved      PT LONG TERM GOAL #2   Title  Patient to demonstrate B LE strength >=4+/5.    Time  4    Period  Weeks    Status  Achieved      PT LONG TERM GOAL #3   Title  Patient to demonstrate lumbar AROM WFL and without pain limiting.    Time  4    Period  Weeks    Status  Partially Met      PT LONG TERM GOAL #4   Title  Patient to report tolerance of 1 hour of sitting without pain limiting.    Time  4    Period  Weeks  Status  Achieved      PT LONG TERM GOAL #5   Title  Patient to demonstrate safe body mechanics with lifting 10lb box without pain limiting.    Time  4    Period  Weeks    Status  Achieved            Plan - 09/05/18 0946    Clinical Impression Statement  Pt. reporting he wishes to go on hold from therapy after today's visit.  Glenn Wheeler has made excellent progress with therapy  noting ~ 75% improvement in back pain since starting PT.  Supervising PT approving 30-day hold today.  Comprehensive HEP reviewed and updated with pt. Pt. achieving LTG #2 today able to demo at least 4+/5 B LE strength with MMT.  Pt. partially achieving LTG #3 today with lumbar AROM WFL however still somewhat limited with flexion ROM likely due to ongoing tightness in B HS.  Pt. able to achieve LTG #4 as he is now able to sit for 2 hours without LBP limiting him.  Ended visit with brief discuss and demo of lifting technique with pt. able to demo pain-free proper lifting technique from floor to mat table achieving LTG #5.  Pt. now on 30-day hold from therapy.    Personal Factors and Comorbidities  Age;Comorbidity 3+;Time since onset of injury/illness/exacerbation;Past/Current Experience    Comorbidities  hx PE 2014, hx leishmaniasis 2013, HLD, exertional angina, R peroneal tendon repair 2005, R RTC repair 2007    Rehab Potential  Good    PT Treatment/Interventions  ADLs/Self Care Home Management;Cryotherapy;Electrical Stimulation;Moist Heat;Traction;Therapeutic exercise;Therapeutic activities;Functional mobility training;Stair training;Gait training;Ultrasound;Neuromuscular re-education;Patient/family education;Manual techniques;Taping;Splinting;Energy conservation;Dry needling;Passive range of motion    PT Next Visit Plan  pt. on 30-day hold    Consulted and Agree with Plan of Care  Patient       Patient will benefit from skilled therapeutic intervention in order to improve the following deficits and impairments:  Hypomobility, Decreased activity tolerance, Decreased strength, Pain, Decreased range of motion, Improper body mechanics, Postural dysfunction, Impaired flexibility, Abnormal gait  Visit Diagnosis: 1. Acute midline low back pain without sciatica   2. Other symptoms and signs involving the musculoskeletal system        Problem List Patient Active Problem List   Diagnosis Date Noted  .  Coronary artery disease involving native coronary artery of native heart without angina pectoris 07/26/2018  . Abnormal stress test 07/19/2018  . NSVT (nonsustained ventricular tachycardia) ([place]) 07/19/2018  . Thrombocytopathia ([place]) 07/19/2018  . Exertional chest pain 06/20/2018  . Palpitations 06/20/2018  . H/O mitral valve prolapse 06/20/2018  . Tremor 06/05/2013  . Leishmaniasis, mucocutaneous 04/14/2013  . History of pulmonary embolism 04/14/2013  . Hypogonadism male 01/12/2012  . Hx of colonic polyps 01/08/2012  . Erectile dysfunction 12/03/2011  . Myalgia 08/18/2011  . External hemorrhoid 08/18/2011  . Left lateral epicondylitis 04/08/2011  . Hyperlipidemia 02/15/2011  . Obesity 02/15/2011  . Leishmaniasis, cutaneous, American 12/01/2010  . Pulmonary embolism ([place]) 11/28/2010    Glenn Wheeler, Glenn Wheeler 09/05/18 12:33 PM   [facility] [address]  [place], [place], [address] Phone: [phone]   Fax:  [phone]  Name: Glenn Wheeler MRN: [id number] Date of Birth: [birth year]  PHYSICAL THERAPY DISCHARGE SUMMARY  Visits from Start of Care: 6  Current functional level related to goals / functional outcomes: See above clinical impression; patient did not return after being placed on 30 day hold  Remaining deficits: none   Education / Equipment: HEP  Plan: Patient agrees to discharge.  Patient goals were met. Patient is being discharged due to meeting the stated rehab goals.  ?????     Janene Harvey, PT, DPT 10/09/18 11:28 AM

## 2018-09-09 ENCOUNTER — Encounter: Payer: BLUE CROSS/BLUE SHIELD | Admitting: Physical Therapy

## 2018-09-13 ENCOUNTER — Encounter: Payer: BLUE CROSS/BLUE SHIELD | Admitting: Physical Therapy

## 2018-09-19 DIAGNOSIS — L565 Disseminated superficial actinic porokeratosis (DSAP): Secondary | ICD-10-CM | POA: Diagnosis not present

## 2018-09-19 DIAGNOSIS — D225 Melanocytic nevi of trunk: Secondary | ICD-10-CM | POA: Diagnosis not present

## 2018-09-19 DIAGNOSIS — B353 Tinea pedis: Secondary | ICD-10-CM | POA: Diagnosis not present

## 2018-09-19 DIAGNOSIS — L738 Other specified follicular disorders: Secondary | ICD-10-CM | POA: Diagnosis not present

## 2018-09-19 DIAGNOSIS — L814 Other melanin hyperpigmentation: Secondary | ICD-10-CM | POA: Diagnosis not present

## 2018-09-19 DIAGNOSIS — L570 Actinic keratosis: Secondary | ICD-10-CM | POA: Diagnosis not present

## 2018-09-19 DIAGNOSIS — D1801 Hemangioma of skin and subcutaneous tissue: Secondary | ICD-10-CM | POA: Diagnosis not present

## 2018-10-11 DIAGNOSIS — Z23 Encounter for immunization: Secondary | ICD-10-CM | POA: Diagnosis not present

## 2018-11-27 DIAGNOSIS — M7732 Calcaneal spur, left foot: Secondary | ICD-10-CM | POA: Diagnosis not present

## 2018-11-27 DIAGNOSIS — M79672 Pain in left foot: Secondary | ICD-10-CM | POA: Diagnosis not present

## 2018-11-27 DIAGNOSIS — R351 Nocturia: Secondary | ICD-10-CM | POA: Diagnosis not present

## 2018-12-18 ENCOUNTER — Ambulatory Visit (INDEPENDENT_AMBULATORY_CARE_PROVIDER_SITE_OTHER): Payer: Medicare Other

## 2018-12-18 ENCOUNTER — Other Ambulatory Visit: Payer: Self-pay

## 2018-12-18 ENCOUNTER — Ambulatory Visit (INDEPENDENT_AMBULATORY_CARE_PROVIDER_SITE_OTHER): Payer: Medicare Other | Admitting: Podiatry

## 2018-12-18 ENCOUNTER — Other Ambulatory Visit: Payer: Self-pay | Admitting: Podiatry

## 2018-12-18 DIAGNOSIS — M79672 Pain in left foot: Secondary | ICD-10-CM

## 2018-12-18 DIAGNOSIS — M722 Plantar fascial fibromatosis: Secondary | ICD-10-CM | POA: Diagnosis not present

## 2018-12-18 MED ORDER — METHYLPREDNISOLONE 4 MG PO TBPK: ORAL_TABLET | ORAL | 0 refills | Status: DC

## 2018-12-28 NOTE — Progress Notes (Signed)
   Subjective: 63 y.o. male presenting today as a new patient with a chief complaint of intermittent sharp pain of the left heel that began three weeks ago. He was seen by his PCP and diagnosed with a bone spur via X-Ray. He states the pain is worse in the morning when he first gets out of bed. Walking makes the pain constant. He has been exercising the feet for treatment. Patient is here for further evaluation and treatment.    Past Medical History:  Diagnosis Date  . Chest pain   . Colon polyps    2001, 2007  . Exertional angina ([place]) 07/22/2018  . History of chicken pox    childhood  . Hyperlipidemia   . Leishmaniasis 05-2011  . Pulmonary embolus ([facility])      Objective: Physical Exam General: The patient is alert and oriented x3 in no acute distress.  Dermatology: Skin is warm, dry and supple bilateral lower extremities. Negative for open lesions or macerations bilateral.   Vascular: Dorsalis Pedis and Posterior Tibial pulses palpable bilateral.  Capillary fill time is immediate to all digits.  Neurological: Epicritic and protective threshold intact bilateral.   Musculoskeletal: Tenderness to palpation to the plantar aspect of the left heel along the plantar fascia. All other joints range of motion within normal limits bilateral. Strength 5/5 in all groups bilateral.   Radiographic exam: Normal osseous mineralization. Joint spaces preserved. No fracture/dislocation/boney destruction. No other soft tissue abnormalities or radiopaque foreign bodies.   Assessment: 1. Plantar fasciitis left foot  Plan of Care:  1. Patient evaluated. Xrays reviewed.   2. Injection of 0.5cc Celestone soluspan injected into the left plantar fascia.  3. Rx for Medrol Dose Pak placed. Then, continue taking Motrin 800 mg.  4. Plantar fascial band(s) dispensed  5. Recommended Powerstep OTC insoles from [employer].  6. Instructed patient regarding therapies and modalities at home to alleviate symptoms.  7.  Return to clinic in 4 weeks.    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    Edrick Kins, DPM [facility]  Dr. Edrick Kins, DPM    [address], [place] [address]                Office [phone]  Fax [phone]

## 2019-01-08 DIAGNOSIS — E7800 Pure hypercholesterolemia, unspecified: Secondary | ICD-10-CM | POA: Diagnosis not present

## 2019-01-08 DIAGNOSIS — I10 Essential (primary) hypertension: Secondary | ICD-10-CM | POA: Diagnosis not present

## 2019-01-15 ENCOUNTER — Ambulatory Visit: Payer: Medicare Other | Admitting: Podiatry

## 2019-01-15 DIAGNOSIS — R7303 Prediabetes: Secondary | ICD-10-CM | POA: Diagnosis not present

## 2019-01-15 DIAGNOSIS — I10 Essential (primary) hypertension: Secondary | ICD-10-CM | POA: Diagnosis not present

## 2019-02-05 ENCOUNTER — Other Ambulatory Visit: Payer: Self-pay

## 2019-02-05 ENCOUNTER — Encounter: Payer: Self-pay | Admitting: Cardiology

## 2019-02-05 ENCOUNTER — Ambulatory Visit (INDEPENDENT_AMBULATORY_CARE_PROVIDER_SITE_OTHER): Payer: Medicare Other | Admitting: Cardiology

## 2019-02-05 VITALS — BP 120/71 | HR 63 | Temp 98.1°F | Ht 72.0 in | Wt 194.0 lb

## 2019-02-05 DIAGNOSIS — E7800 Pure hypercholesterolemia, unspecified: Secondary | ICD-10-CM

## 2019-02-05 DIAGNOSIS — R079 Chest pain, unspecified: Secondary | ICD-10-CM | POA: Diagnosis not present

## 2019-02-05 DIAGNOSIS — I472 Ventricular tachycardia: Secondary | ICD-10-CM

## 2019-02-05 DIAGNOSIS — I2510 Atherosclerotic heart disease of native coronary artery without angina pectoris: Secondary | ICD-10-CM

## 2019-02-05 DIAGNOSIS — I4729 Other ventricular tachycardia: Secondary | ICD-10-CM

## 2019-02-05 MED ORDER — METOPROLOL SUCCINATE ER 25 MG PO TB24: 25.0000 mg | ORAL_TABLET | Freq: Every day | ORAL | 3 refills | Status: DC

## 2019-02-05 MED ORDER — ATORVASTATIN CALCIUM 40 MG PO TABS: 40.0000 mg | ORAL_TABLET | Freq: Every day | ORAL | 3 refills | Status: DC

## 2019-02-05 MED ORDER — NITROGLYCERIN 0.4 MG SL SUBL: 0.4000 mg | SUBLINGUAL_TABLET | SUBLINGUAL | 2 refills | Status: DC | PRN

## 2019-02-05 NOTE — Progress Notes (Signed)
Patient referred by [name], MD for chest pain.  Subjective:   Glenn Wheeler, male    DOB: [birth year], 64 y.o.   MRN: [id number]    Chief Complaint  Patient presents with  . Coronary Artery Disease  . NSVT  . Follow-up    9mo   64y.o. Caucasian male with hypertension, hyperlipidemia, CAD with 40% distal left main calcific stenosis with favorable IVUS area (8.2 mm2) on cath , h/o Leishmaniasis in 2013, history of PE in 2014,  former smoker, now with exertional chest pain.   Patient underwent coronary angiogram on 07/23/2018 given complaints of chest pain, NSVT on monitor, and abnormal stress test showing RCA territory ischemia. RCA is wide open. He does have distal left main 40% calcific stenosis, with IVUS area measuring 8.2 mm2 (>6 mm2) suggesting favorable prognosis with medical therapy alone.   Overall, patient is doing well.  He walks 12,000 steps every day without any symptoms of chest pain or shortness of breath.  He has occasional "flutter sensation" lasting for seconds or 2, especially when he is lifting heavy weights.  However, he does not have any anginal symptoms associated with it.  He is compliant with his medical therapy.  He recently had to go visit with his PCP and her lipid panel was checked, results not available to me.  Lipid panel had reportedly improved.  His blood sugar is marginally elevated.  He will have follow-up visit with PCP in 3 months to discuss whether he is not prediabetic or not.   Current Outpatient Medications on File Prior to Visit  Medication Sig Dispense Refill  . aspirin 81 MG tablet Take 162 mg by mouth at bedtime.     .Marland Kitchenatorvastatin (LIPITOR) 40 MG tablet Take 0.5 tablets (20 mg total) by mouth at bedtime. 60 tablet 3  . diltiazem (CARDIZEM) 30 MG tablet Take 1 tablet (30 mg total) by mouth 3 (three) times daily as needed. Please take for episodes of palpitations/flutter sensation. 60 tablet 3  . ibuprofen (ADVIL) 800 MG tablet Take 800  mg by mouth every 8 (eight) hours as needed.    . methylPREDNISolone (MEDROL DOSEPAK) 4 MG TBPK tablet 6 day dose pack - take as directed 21 tablet 0  . metoprolol succinate (TOPROL XL) 25 MG 24 hr tablet Take 1 tablet (25 mg total) by mouth daily. 90 tablet 1  . nitroGLYCERIN (NITROSTAT) 0.4 MG SL tablet Place 1 tablet (0.4 mg total) under the tongue every 5 (five) minutes as needed for up to 30 days for chest pain. 90 tablet 3  . tamsulosin (FLOMAX) 0.4 MG CAPS capsule Take 0.4 mg by mouth at bedtime.     . [DISCONTINUED] fluticasone (FLONASE) 50 MCG/ACT nasal spray Place 1 spray into the nose daily. 16 g 3   No current facility-administered medications on file prior to visit.    Cardiovascular studies:  EKG 02/05/2019: Sinus rhythm 61 bpm. Normal EKG.  Coronary angiography 07/23/2018: LM: Calcified 40% mid to distal left main stenosis. IVUS MLA 8.2 mm2 (>6 mm2 has favorable prognosis with medical therapy alone) LAD: Normal LCx: Normal Ramus: Normal RCA: Large vessel. Normal.   Event monitor 07/16/2018: 7 sec NSVT Associated symptom: Flutter sensation  Echocardiogram 07/18/2018: Low normal LV systolic function with EF 50-55%. Left ventricle cavity is normal in size. Normal global wall motion. Normal diastolic filling pattern.  Mild tricuspid regurgitation. No evidence of pulmonary hypertension.  Lexiscan Sestamibi Stress Test 07/08/2018: Stress EKG is  non-diagnostic, as this is pharmacological stress test. Mild degree large extent perfusion consistent with moderate ischemia located in the apical inferior wall, mid inferior wall, mid inferoseptal wall, basal inferior wall and basal inferoseptal wall (Right Coronary Artery region) of left ventricle. Hypokinesis of the apical inferior wall, apical septal wall, mid inferior wall, mid inferoseptal wall, basal inferior wall and basal inferoseptal wall of the left ventricle.  Stress LV EF: 48%.  Intermediate risk study. Clinical  correlation recommended.   Recent labs: 07/09/2018: Glucose 95. BUN/Cr 33/1.1. FAOZ30. Na/K 141/4.5. Rest of the CMP normal.  H/H 15/43. MCV 90. Platelets 124 (Stable thrombocytopenia) Chol 175, TG 102, HDL 46, LDL 109.    10/25/2017: Glucose 97.  BUN/creatinine 26/1.11.  EGFR 70.  Sodium 146, potassium 4.3 H/H 14/43.  MCV 88.  Platelets 112. Cholesterol 122, triglycerides 84, HDL 39, LDL 66  Review of Systems  Constitution: Negative for decreased appetite, malaise/fatigue, weight gain and weight loss.  HENT: Negative for congestion.   Eyes: Negative for visual disturbance.  Cardiovascular: Positive for chest pain and palpitations. Negative for dyspnea on exertion, leg swelling and syncope.  Respiratory: Negative for cough.   Endocrine: Negative for cold intolerance.  Hematologic/Lymphatic: Does not bruise/bleed easily.  Skin: Negative for itching and rash.  Musculoskeletal: Negative for myalgias.  Gastrointestinal: Negative for abdominal pain, nausea and vomiting.  Genitourinary: Negative for dysuria.  Neurological: Negative for dizziness and weakness.  Psychiatric/Behavioral: The patient is not nervous/anxious.   All other systems reviewed and are negative.       Vitals:   02/05/19 0924  BP: 120/71  Pulse: 63  Temp: 98.1 F (36.7 C)  SpO2: 96%      Objective:   Physical Exam  Constitutional: He is oriented to person, place, and time. He appears well-developed and well-nourished. No distress.  HENT:  Head: Normocephalic and atraumatic.  Eyes: Pupils are equal, round, and reactive to light. Conjunctivae are normal.  Neck: No JVD present.  Cardiovascular: Normal rate, regular rhythm and intact distal pulses.  No murmur heard. Pulmonary/Chest: Effort normal and breath sounds normal. He has no wheezes. He has no rales.  Abdominal: Soft. Bowel sounds are normal. There is no rebound.  Musculoskeletal:        General: No edema.  Lymphadenopathy:    He has no  cervical adenopathy.  Neurological: He is alert and oriented to person, place, and time. No cranial nerve deficit.  Skin: Skin is warm and dry.  Psychiatric: He has a normal mood and affect.  Nursing note and vitals reviewed.         Assessment & Recommendations:   64 y.o. Caucasian male with hypertension, hyperlipidemia, CAD with 40% distal left main calcific stenosis with favorable IVUS area (8.2 mm2), h/o Leishmaniasis in 2013, history of PE in 2014,  former smoker, now with exertional chest pain.  1. CAD: 40% distal left main calcific stenosis with favorable IVUS area (8.2 mm2) Continue Aspirin (on 162 mg), lipitor 40 mg, metoprolol succinate 25 mg daily. He has not had to use any SL NTG. He has not used diltiazem in several months.  Refilled metoprolol.  We will get lipid panel results from PCP.  LDL goal is <70. Would like to continue diet and lifestyle modification, including carbohydrate counting and regular walk before adding Metformin for prediabetes.  2. NSVT: I suspect this is idiopathic given nonobstructive CAD and structurally normal heart.  He has had these symptoms for several years, but has not had any  syncope event. Recommend diltiazem 30 mg for prn use.   3. Mild thrombocytopenia: Stable, asymptomatic.  F/u in 1 year, unless patient develops any new angina/dyspnea/precyncope symptoms.  Nigel Mormon, MD [facility] [place] Pager: [phone] Office: [phone] If no answer Cell [phone]

## 2019-02-11 ENCOUNTER — Encounter: Payer: Self-pay | Admitting: Cardiology

## 2019-02-14 ENCOUNTER — Other Ambulatory Visit: Payer: Self-pay | Admitting: Cardiology

## 2019-02-14 DIAGNOSIS — I2510 Atherosclerotic heart disease of native coronary artery without angina pectoris: Secondary | ICD-10-CM

## 2019-02-24 ENCOUNTER — Other Ambulatory Visit: Payer: Self-pay

## 2019-02-24 ENCOUNTER — Ambulatory Visit (INDEPENDENT_AMBULATORY_CARE_PROVIDER_SITE_OTHER): Payer: Medicare Other | Admitting: Podiatry

## 2019-02-24 DIAGNOSIS — M722 Plantar fascial fibromatosis: Secondary | ICD-10-CM

## 2019-02-24 MED ORDER — METHYLPREDNISOLONE 4 MG PO TBPK: ORAL_TABLET | ORAL | 0 refills | Status: DC

## 2019-02-26 ENCOUNTER — Telehealth: Payer: Self-pay | Admitting: *Deleted

## 2019-02-26 NOTE — Progress Notes (Signed)
   Subjective: 64 y.o. male presenting today for follow up evaluation of plantar fasciitis of the left foot. He states the pain had resolved until it flared back up about a week ago. He now reports intermittent sharp pain to the heel. He states the injection, fascial brace and Powerstep insoles helped alleviate the pain. Walking and being on the foot makes it worse. Patient is here for further evaluation and treatment.   Past Medical History:  Diagnosis Date  . Chest pain   . Colon polyps    2001, 2007  . Exertional angina ([place]) 07/22/2018  . History of chicken pox    childhood  . Hyperlipidemia   . Leishmaniasis 05-2011  . Pulmonary embolus (HCC)      Objective: Physical Exam General: The patient is alert and oriented x3 in no acute distress.  Dermatology: Skin is warm, dry and supple bilateral lower extremities. Negative for open lesions or macerations bilateral.   Vascular: Dorsalis Pedis and Posterior Tibial pulses palpable bilateral.  Capillary fill time is immediate to all digits.  Neurological: Epicritic and protective threshold intact bilateral.   Musculoskeletal: Tenderness to palpation to the plantar aspect of the left heel along the plantar fascia. All other joints range of motion within normal limits bilateral. Strength 5/5 in all groups bilateral.   Assessment: 1. Plantar fasciitis left foot  Plan of Care:  1. Patient evaluated.  2. Injection of 0.5cc Celestone soluspan injected into the left plantar fascia.  3. Rx for Medrol Dose Pak placed. Then, continue taking Motrin 800 mg.  4. Continue using OTC Powerstep Insoles.  5. Recommended Vionic men's sandals.  6. Return to clinic as needed.    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    Edrick Kins, DPM [facility]  Dr. Edrick Kins, DPM    [address], [place] [address]                Office [phone]  Fax [phone]

## 2019-02-26 NOTE — Telephone Encounter (Signed)
I spoke with pt and told him he may be having a steroid flare that may last 3-5 days and to continue with the steroid pack, tylenol regular strength as package directs and ice there area 3-4 times daily at least, 15-20 minutes/session protecting the skin from the ice with a light cloth. Pt asked how long he should let it go on that he is unable to put his foot down. I told pt that if he was still have that severe pain at 5 days to call the office, Dr. Amalia Hailey may prescribe an antiinflammatory or he may even want to schedule to come in.

## 2019-02-26 NOTE — Telephone Encounter (Signed)
Pt called states he received an injection and steroid 2 days ago and his foot still hurts.

## 2019-03-09 ENCOUNTER — Ambulatory Visit: Payer: Medicare Other | Attending: Internal Medicine

## 2019-03-09 DIAGNOSIS — Z23 Encounter for immunization: Secondary | ICD-10-CM | POA: Insufficient documentation

## 2019-03-09 NOTE — Progress Notes (Signed)
   Covid-19 Vaccination Clinic  Name:  Glenn Wheeler    MRN: [id number] DOB: 1956-01-16  03/09/2019  [name] was observed post Covid-19 immunization for 15 minutes without incidence. He was provided with Vaccine Information Sheet and instruction to access the V-Safe system.   [name] was instructed to call 911 with any severe reactions post vaccine: Marland Kitchen Difficulty breathing  . Swelling of your face and throat  . A fast heartbeat  . A bad rash all over your body  . Dizziness and weakness    Immunizations Administered    Name Date Dose VIS Date Route   Pfizer COVID-19 Vaccine 03/09/2019  4:27 PM 0.3 mL 01/10/2019 Intramuscular   Manufacturer: [place]   Lot: [id number]   [place]: [id number]

## 2019-03-24 ENCOUNTER — Ambulatory Visit: Payer: BLUE CROSS/BLUE SHIELD

## 2019-04-03 ENCOUNTER — Ambulatory Visit: Payer: Medicare Other | Attending: Internal Medicine

## 2019-04-03 DIAGNOSIS — Z23 Encounter for immunization: Secondary | ICD-10-CM | POA: Insufficient documentation

## 2019-04-03 NOTE — Progress Notes (Signed)
   Covid-19 Vaccination Clinic  Name:  Glenn Wheeler    MRN: [id number] DOB: 1955-10-01  04/03/2019  [name] was observed post Covid-19 immunization for 15 minutes without incident. He was provided with Vaccine Information Sheet and instruction to access the V-Safe system.   [name] was instructed to call 911 with any severe reactions post vaccine: Marland Kitchen Difficulty breathing  . Swelling of face and throat  . A fast heartbeat  . A bad rash all over body  . Dizziness and weakness   Immunizations Administered    Name Date Dose VIS Date Route   Pfizer COVID-19 Vaccine 04/03/2019  5:02 PM 0.3 mL 01/10/2019 Intramuscular   Manufacturer: [place]   Lot: [id number]   [place]: [id number]

## 2019-05-15 DIAGNOSIS — I10 Essential (primary) hypertension: Secondary | ICD-10-CM | POA: Diagnosis not present

## 2019-05-15 DIAGNOSIS — R7303 Prediabetes: Secondary | ICD-10-CM | POA: Diagnosis not present

## 2019-05-20 DIAGNOSIS — R7303 Prediabetes: Secondary | ICD-10-CM | POA: Diagnosis not present

## 2019-05-20 DIAGNOSIS — R351 Nocturia: Secondary | ICD-10-CM | POA: Diagnosis not present

## 2019-05-20 DIAGNOSIS — I10 Essential (primary) hypertension: Secondary | ICD-10-CM | POA: Diagnosis not present

## 2019-05-20 DIAGNOSIS — B552 Mucocutaneous leishmaniasis: Secondary | ICD-10-CM | POA: Diagnosis not present

## 2019-05-20 DIAGNOSIS — Z86718 Personal history of other venous thrombosis and embolism: Secondary | ICD-10-CM | POA: Diagnosis not present

## 2019-05-20 DIAGNOSIS — E7800 Pure hypercholesterolemia, unspecified: Secondary | ICD-10-CM | POA: Diagnosis not present

## 2019-05-20 DIAGNOSIS — Z86711 Personal history of pulmonary embolism: Secondary | ICD-10-CM | POA: Diagnosis not present

## 2019-06-26 DIAGNOSIS — R350 Frequency of micturition: Secondary | ICD-10-CM | POA: Diagnosis not present

## 2019-06-26 DIAGNOSIS — R351 Nocturia: Secondary | ICD-10-CM | POA: Diagnosis not present

## 2019-06-26 DIAGNOSIS — R3912 Poor urinary stream: Secondary | ICD-10-CM | POA: Diagnosis not present

## 2019-07-24 ENCOUNTER — Telehealth: Payer: Self-pay | Admitting: Podiatry

## 2019-07-24 NOTE — Telephone Encounter (Signed)
Pt called and wanted to know what sandal that Dr.Evans was recommending for arch suppport for P/F he stated he was to look on [facility] please assist thank you

## 2019-07-24 NOTE — Telephone Encounter (Signed)
Pt would like to know which sandal from  [place] did evans recommend it was for arch support

## 2019-08-06 DIAGNOSIS — R351 Nocturia: Secondary | ICD-10-CM | POA: Diagnosis not present

## 2019-08-21 DIAGNOSIS — J00 Acute nasopharyngitis [common cold]: Secondary | ICD-10-CM | POA: Diagnosis not present

## 2019-09-26 ENCOUNTER — Other Ambulatory Visit: Payer: Self-pay | Admitting: Cardiology

## 2019-09-26 DIAGNOSIS — I2510 Atherosclerotic heart disease of native coronary artery without angina pectoris: Secondary | ICD-10-CM

## 2019-09-26 DIAGNOSIS — E7800 Pure hypercholesterolemia, unspecified: Secondary | ICD-10-CM

## 2019-09-29 ENCOUNTER — Other Ambulatory Visit: Payer: Self-pay

## 2019-09-29 DIAGNOSIS — I2510 Atherosclerotic heart disease of native coronary artery without angina pectoris: Secondary | ICD-10-CM

## 2019-09-29 DIAGNOSIS — E7800 Pure hypercholesterolemia, unspecified: Secondary | ICD-10-CM

## 2019-09-29 MED ORDER — ATORVASTATIN CALCIUM 40 MG PO TABS: 40.0000 mg | ORAL_TABLET | Freq: Every day | ORAL | 3 refills | Status: DC

## 2019-10-09 DIAGNOSIS — B552 Mucocutaneous leishmaniasis: Secondary | ICD-10-CM | POA: Diagnosis not present

## 2019-10-09 DIAGNOSIS — E7800 Pure hypercholesterolemia, unspecified: Secondary | ICD-10-CM | POA: Diagnosis not present

## 2019-10-09 DIAGNOSIS — Z87898 Personal history of other specified conditions: Secondary | ICD-10-CM | POA: Diagnosis not present

## 2019-10-09 DIAGNOSIS — Z86711 Personal history of pulmonary embolism: Secondary | ICD-10-CM | POA: Diagnosis not present

## 2019-10-16 DIAGNOSIS — D225 Melanocytic nevi of trunk: Secondary | ICD-10-CM | POA: Diagnosis not present

## 2019-10-16 DIAGNOSIS — D2271 Melanocytic nevi of right lower limb, including hip: Secondary | ICD-10-CM | POA: Diagnosis not present

## 2019-10-16 DIAGNOSIS — L603 Nail dystrophy: Secondary | ICD-10-CM | POA: Diagnosis not present

## 2019-10-16 DIAGNOSIS — Z85828 Personal history of other malignant neoplasm of skin: Secondary | ICD-10-CM | POA: Diagnosis not present

## 2019-10-16 DIAGNOSIS — L738 Other specified follicular disorders: Secondary | ICD-10-CM | POA: Diagnosis not present

## 2019-10-16 DIAGNOSIS — D1801 Hemangioma of skin and subcutaneous tissue: Secondary | ICD-10-CM | POA: Diagnosis not present

## 2019-10-16 DIAGNOSIS — L570 Actinic keratosis: Secondary | ICD-10-CM | POA: Diagnosis not present

## 2019-10-16 DIAGNOSIS — D2272 Melanocytic nevi of left lower limb, including hip: Secondary | ICD-10-CM | POA: Diagnosis not present

## 2019-11-02 DIAGNOSIS — Z23 Encounter for immunization: Secondary | ICD-10-CM | POA: Diagnosis not present

## 2019-12-11 DIAGNOSIS — Z1331 Encounter for screening for depression: Secondary | ICD-10-CM | POA: Diagnosis not present

## 2019-12-11 DIAGNOSIS — Z131 Encounter for screening for diabetes mellitus: Secondary | ICD-10-CM | POA: Diagnosis not present

## 2019-12-11 DIAGNOSIS — Z20822 Contact with and (suspected) exposure to covid-19: Secondary | ICD-10-CM | POA: Diagnosis not present

## 2019-12-11 DIAGNOSIS — Z0000 Encounter for general adult medical examination without abnormal findings: Secondary | ICD-10-CM | POA: Diagnosis not present

## 2019-12-11 DIAGNOSIS — I10 Essential (primary) hypertension: Secondary | ICD-10-CM | POA: Diagnosis not present

## 2019-12-11 DIAGNOSIS — E7800 Pure hypercholesterolemia, unspecified: Secondary | ICD-10-CM | POA: Diagnosis not present

## 2019-12-11 DIAGNOSIS — Z79899 Other long term (current) drug therapy: Secondary | ICD-10-CM | POA: Diagnosis not present

## 2019-12-11 DIAGNOSIS — Z6828 Body mass index (BMI) 28.0-28.9, adult: Secondary | ICD-10-CM | POA: Diagnosis not present

## 2019-12-11 DIAGNOSIS — Z125 Encounter for screening for malignant neoplasm of prostate: Secondary | ICD-10-CM | POA: Diagnosis not present

## 2019-12-11 DIAGNOSIS — Z1339 Encounter for screening examination for other mental health and behavioral disorders: Secondary | ICD-10-CM | POA: Diagnosis not present

## 2019-12-11 DIAGNOSIS — Z1159 Encounter for screening for other viral diseases: Secondary | ICD-10-CM | POA: Diagnosis not present

## 2019-12-11 DIAGNOSIS — E559 Vitamin D deficiency, unspecified: Secondary | ICD-10-CM | POA: Diagnosis not present

## 2020-02-09 ENCOUNTER — Other Ambulatory Visit: Payer: Self-pay

## 2020-02-09 DIAGNOSIS — I2510 Atherosclerotic heart disease of native coronary artery without angina pectoris: Secondary | ICD-10-CM

## 2020-02-10 ENCOUNTER — Other Ambulatory Visit: Payer: Self-pay

## 2020-02-10 DIAGNOSIS — E7800 Pure hypercholesterolemia, unspecified: Secondary | ICD-10-CM

## 2020-02-10 DIAGNOSIS — I2510 Atherosclerotic heart disease of native coronary artery without angina pectoris: Secondary | ICD-10-CM

## 2020-02-10 MED ORDER — METOPROLOL SUCCINATE ER 25 MG PO TB24: 25.0000 mg | ORAL_TABLET | Freq: Every day | ORAL | 3 refills | Status: DC

## 2020-02-10 MED ORDER — ATORVASTATIN CALCIUM 40 MG PO TABS: 40.0000 mg | ORAL_TABLET | Freq: Every day | ORAL | 3 refills | Status: DC

## 2020-02-13 ENCOUNTER — Ambulatory Visit: Payer: Medicare Other | Admitting: Cardiology

## 2020-04-06 DIAGNOSIS — R42 Dizziness and giddiness: Secondary | ICD-10-CM | POA: Diagnosis not present

## 2020-04-20 DIAGNOSIS — Z20822 Contact with and (suspected) exposure to covid-19: Secondary | ICD-10-CM | POA: Diagnosis not present

## 2020-04-20 DIAGNOSIS — E559 Vitamin D deficiency, unspecified: Secondary | ICD-10-CM | POA: Diagnosis not present

## 2020-04-20 DIAGNOSIS — Z1159 Encounter for screening for other viral diseases: Secondary | ICD-10-CM | POA: Diagnosis not present

## 2020-04-20 DIAGNOSIS — I10 Essential (primary) hypertension: Secondary | ICD-10-CM | POA: Diagnosis not present

## 2020-04-20 DIAGNOSIS — E7800 Pure hypercholesterolemia, unspecified: Secondary | ICD-10-CM | POA: Diagnosis not present

## 2020-04-20 DIAGNOSIS — Z79899 Other long term (current) drug therapy: Secondary | ICD-10-CM | POA: Diagnosis not present

## 2020-05-02 DIAGNOSIS — Z23 Encounter for immunization: Secondary | ICD-10-CM | POA: Diagnosis not present

## 2020-12-04 DIAGNOSIS — Z23 Encounter for immunization: Secondary | ICD-10-CM | POA: Diagnosis not present

## 2021-03-25 ENCOUNTER — Ambulatory Visit: Payer: Medicare Other | Admitting: Cardiology

## 2021-03-30 NOTE — Progress Notes (Signed)
? ?Follow up visit ? ?Subjective:  ? ?Glenn Wheeler, male    DOB: 1955/06/04, 66 y.o.   MRN: [id number] ? ? ? ? ?HPI ? ?Chief Complaint  ?Patient presents with  ? Coronary Artery Disease  ? Follow-up  ? Palpitations  ? ? ?66 y.o. Caucasian male with hypertension, hyperlipidemia, CAD with 40% distal left main calcific stenosis with favorable IVUS area (8.2 mm2) on cath (07/2018), h/o Leishmaniasis in 2013, h/o PE (2014,)  former smoker, with exertional palpitation symptoms. ?   ?Patient underwent coronary angiogram on 07/23/2018 given complaints of chest pain, NSVT on monitor, and abnormal stress test showing RCA territory ischemia. RCA is wide open. He does have distal left main 40% calcific stenosis, with IVUS area measuring 8.2 mm2 (>6 mm2) suggesting favorable prognosis with medical therapy alone.  ?  ?Patient seen today after >2 years.  Patient has similar complaints like 2-3 years ago.  He has predictable occurrence of palpitations only with more than usual exertion, such as climbing up.  Symptoms improve after rest.  She did: EGD basis.  He has never taken nitroglycerin.  He denies any specific chest pain symptoms. ? ? ?Current Outpatient Medications:  ?  aspirin 81 MG tablet, Take 162 mg by mouth at bedtime. , Disp: , Rfl:  ?  atorvastatin (LIPITOR) 40 MG tablet, Take 1 tablet (40 mg total) by mouth at bedtime., Disp: 90 tablet, Rfl: 3 ?  diltiazem (CARDIZEM) 30 MG tablet, Take 1 tablet (30 mg total) by mouth 3 (three) times daily as needed. Please take for episodes of palpitations/flutter sensation., Disp: 60 tablet, Rfl: 3 ?  methylPREDNISolone (MEDROL DOSEPAK) 4 MG TBPK tablet, 6 day dose pack - take as directed, Disp: 21 tablet, Rfl: 0 ?  metoprolol succinate (TOPROL-XL) 25 MG 24 hr tablet, Take 1 tablet (25 mg total) by mouth daily., Disp: 90 tablet, Rfl: 3 ?  nitroGLYCERIN (NITROSTAT) 0.4 MG SL tablet, Place 1 tablet (0.4 mg total) under the tongue every 5 (five) minutes as needed for chest pain., Disp:  30 tablet, Rfl: 2 ?  tamsulosin (FLOMAX) 0.4 MG CAPS capsule, Take 0.4 mg by mouth at bedtime. , Disp: , Rfl:   ? ?Cardiovascular & other pertient studies: ? ?Reviewed external labs and tests, independently interpreted ? ?EKG 03/31/2021: ?Sinus rhythm 61 bpm ?Normal EKG ? ? ?Recent labs: ?No recent labs ? ? ?Review of Systems  ?Cardiovascular:  Negative for chest pain, dyspnea on exertion, leg swelling, palpitations and syncope.  ? ?   ? ? ?Vitals:  ? 03/31/21 0827  ?BP: (!) 143/80  ?Temp: 98 ?F (36.7 ?C)  ?SpO2: 96%  ? ? ?Body mass index is 30.24 kg/m?. Danley Danker Weights  ? 03/31/21 0827  ?Weight: 223 lb (101.2 kg)  ? ? ? ?Objective:  ? Physical Exam ?Vitals and nursing note reviewed.  ?Constitutional:   ?   General: He is not in acute distress. ?Neck:  ?   Vascular: No JVD.  ?Cardiovascular:  ?   Rate and Rhythm: Normal rate and regular rhythm.  ?   Heart sounds: Normal heart sounds. No murmur heard. ?Pulmonary:  ?   Effort: Pulmonary effort is normal.  ?   Breath sounds: Normal breath sounds. No wheezing or rales.  ?Musculoskeletal:  ?   Right lower leg: No edema.  ?   Left lower leg: No edema.  ? ? ? ? ? ?   ? ? ?Visit diagnoses: ?  ICD-10-CM   ?1. Coronary artery disease  involving native coronary artery of native heart with other form of angina pectoris ([place])  I25.118 EKG 12-Lead  ?  CT CORONARY MORPH W/CTA COR W/SCORE W/CA W/CM &/OR WO/CM  ?  CBC  ?  Basic metabolic panel  ?  Lipid panel  ?  DISCONTINUED: nitroGLYCERIN (NITROSTAT) 0.4 MG SL tablet  ?  ?2. NSVT (nonsustained ventricular tachycardia)  I47.29 EKG 12-Lead  ?  ?3. Palpitations  R00.2 LONG TERM MONITOR (3-14 DAYS)  ?  ?  ? ?Orders Placed This Encounter  ?Procedures  ? CT CORONARY MORPH W/CTA COR W/SCORE W/CA W/CM &/OR WO/CM  ? CBC  ? Basic metabolic panel  ? Lipid panel  ? LONG TERM MONITOR (3-14 DAYS)  ? EKG 12-Lead  ? ? ? ?Medication changes this visit: ? ?Meds ordered this encounter  ?Medications  ? DISCONTD: nitroGLYCERIN (NITROSTAT) 0.4 MG SL tablet   ?  Sig: Place 1 tablet (0.4 mg total) under the tongue every 5 (five) minutes as needed for chest pain.  ?  Dispense:  30 tablet  ?  Refill:  2  ?  ? ?Assessment & Recommendations:  ? ?66 y.o. Caucasian male with hypertension, hyperlipidemia, CAD with 40% distal left main calcific stenosis with favorable IVUS area (8.2 mm2) on cath (07/2018), h/o Leishmaniasis in 2013, h/o PE (2014,)  former smoker, with exertional palpitation symptoms. ? ?Palpitations: ?Only symptom with exertion.  This could potentially be neurogenic without symptoms.  Coronary angiogram in 2020 showed mild left main disease.  Recommend sublingual nitroglycerin for as needed use.  Will obtain coronary CT angiogram to reevaluate his coronary artery disease.  I would avoid stress test given possibility of left main disease. ?Continue aspirin, statin, metoprolol succinate 25 daily. ?Check CBC, BMP, lipid panel. ?He previously had 10 seconds of nonsustained VT on the monitor.  Repeat cardiac telemetry for clinic. ? ? ? ? ? ? ?Nigel Mormon, MD ?Pager: [phone] ?Office: [phone] ?

## 2021-03-31 ENCOUNTER — Encounter: Payer: Self-pay | Admitting: Cardiology

## 2021-03-31 ENCOUNTER — Ambulatory Visit: Payer: Medicare Other | Admitting: Cardiology

## 2021-03-31 ENCOUNTER — Other Ambulatory Visit: Payer: Self-pay

## 2021-03-31 ENCOUNTER — Inpatient Hospital Stay: Payer: Medicare Other

## 2021-03-31 VITALS — BP 143/80 | Temp 98.0°F | Ht 72.0 in | Wt 223.0 lb

## 2021-03-31 DIAGNOSIS — I4729 Other ventricular tachycardia: Secondary | ICD-10-CM

## 2021-03-31 DIAGNOSIS — E782 Mixed hyperlipidemia: Secondary | ICD-10-CM

## 2021-03-31 DIAGNOSIS — R002 Palpitations: Secondary | ICD-10-CM

## 2021-03-31 DIAGNOSIS — I2510 Atherosclerotic heart disease of native coronary artery without angina pectoris: Secondary | ICD-10-CM

## 2021-03-31 DIAGNOSIS — R079 Chest pain, unspecified: Secondary | ICD-10-CM

## 2021-03-31 DIAGNOSIS — I25118 Atherosclerotic heart disease of native coronary artery with other forms of angina pectoris: Secondary | ICD-10-CM

## 2021-03-31 MED ORDER — NITROGLYCERIN 0.4 MG SL SUBL: 0.4000 mg | SUBLINGUAL_TABLET | SUBLINGUAL | 2 refills | Status: DC | PRN

## 2021-03-31 NOTE — Patient Instructions (Signed)
Your cardiac CT will be scheduled at the locations below:  ? ?[facility]  ?[address]  ?[place], [place] [address]  ?[phone]  ? ? ?If scheduled at [facility], please arrive at the [facility] main entrance of [facility] 30-45 minutes prior to test start time.  ?Proceed to the [facility] Radiology Department (first floor) to check-in and test prep.  ? ?Please follow these instructions carefully (unless otherwise directed):  ? ? ?Hold all erectile dysfunction medications at least 3 days (72 hrs) prior to test.  ? ?On the Night Before the Test:  ?? Be sure to Drink plenty of water.  ?? Do not consume any caffeinated/decaffeinated beverages or chocolate 12 hours prior to your test.  ?? Do not take any antihistamines 12 hours prior to your test.  ?? If the patient has contrast allergy:  ?Patient will need a prescription for Prednisone and very clear instructions (as follows):  ?1. Prednisone 50 mg - take 13 hours prior to test  ?2. Take another Prednisone 50 mg 7 hours prior to test  ?3. Take another Prednisone 50 mg 1 hour prior to test  ?4. Take Benadryl 50 mg 1 hour prior to test  ?? Patient must complete all four doses of above prophylactic medications.  ?? Patient will need a ride after test due to Benadryl.  ? ?On the Day of the Test:  ?? Drink plenty of water. Do not drink any water within one hour of the test.  ?? Do not eat any food 4 hours prior to the test.  ?? You may take your regular medications prior to the test.  ? ? ?- Metoprolol succinate ?Currently on 25 mg daily. ?Take 50 mg 2 hours before CT scan ?You may stop it after the CT scan, unless specified otherwise by me.  ? ? ? ?      ?After the Test:  ?? Drink plenty of water.  ?? After receiving IV contrast, you may experience a mild flushed feeling. This is normal.  ?? On occasion, you may experience a mild rash up to 24 hours after the test. This is not dangerous. If this occurs, you can take Benadryl 25 mg and  increase your fluid intake.  ?? If you experience trouble breathing, this can be serious. If it is severe call [phone] IMMEDIATELY. If it is mild, please call our office.  ?? If you take any of these medications: Glipizide/Metformin, Avandament, Glucavance, please do not take 48 hours after completing test unless otherwise instructed.  ? ? ? ?Please contact the cardiac imaging nurse navigator should you have any questions/concerns  ?Marchia Bond, RN Navigator Cardiac Imaging  ?[facility]  ?[phone] Office  ?[phone] Cell  ? ?

## 2021-04-07 ENCOUNTER — Other Ambulatory Visit: Payer: Self-pay

## 2021-04-07 ENCOUNTER — Other Ambulatory Visit: Payer: Self-pay | Admitting: Cardiology

## 2021-04-07 DIAGNOSIS — I2510 Atherosclerotic heart disease of native coronary artery without angina pectoris: Secondary | ICD-10-CM

## 2021-04-07 MED ORDER — METOPROLOL SUCCINATE ER 25 MG PO TB24: 25.0000 mg | ORAL_TABLET | Freq: Every day | ORAL | 3 refills | Status: DC

## 2021-04-12 DIAGNOSIS — L570 Actinic keratosis: Secondary | ICD-10-CM | POA: Insufficient documentation

## 2021-04-12 DIAGNOSIS — R7303 Prediabetes: Secondary | ICD-10-CM | POA: Insufficient documentation

## 2021-04-27 NOTE — Progress Notes (Signed)
Reviewed monitor and lab results with the patient. No recurrence of palpitations or any other symptoms. He wants to hold off CT due cost reasons. In absence of symptoms, okay to hold off stress testing as well. LDL 76, TG 167 on lipitor 40 mg. Discuss diet modifications, including lower carbohydrate and saturated fat intake.Repeat lipid panel and f/u in July. ? ? ?Nigel Mormon, MD ?Pager: [phone] ?Office: [phone] ?

## 2021-04-27 NOTE — Addendum Note (Signed)
Addended by: Nigel Mormon on: 04/27/2021 04:32 PM ? ? Modules accepted: Orders ? ?

## 2021-05-12 ENCOUNTER — Other Ambulatory Visit: Payer: Self-pay

## 2021-05-12 DIAGNOSIS — I2510 Atherosclerotic heart disease of native coronary artery without angina pectoris: Secondary | ICD-10-CM

## 2021-05-12 DIAGNOSIS — E7800 Pure hypercholesterolemia, unspecified: Secondary | ICD-10-CM

## 2021-05-12 MED ORDER — ATORVASTATIN CALCIUM 40 MG PO TABS: 40.0000 mg | ORAL_TABLET | Freq: Every day | ORAL | 3 refills | Status: DC

## 2021-08-08 ENCOUNTER — Encounter: Payer: Self-pay | Admitting: Cardiology

## 2021-08-08 ENCOUNTER — Ambulatory Visit: Payer: Medicare Other | Admitting: Cardiology

## 2021-08-08 VITALS — BP 135/79 | HR 68 | Temp 97.7°F | Resp 16 | Ht 72.0 in | Wt 225.0 lb

## 2021-08-08 DIAGNOSIS — R002 Palpitations: Secondary | ICD-10-CM

## 2021-08-08 DIAGNOSIS — I25118 Atherosclerotic heart disease of native coronary artery with other forms of angina pectoris: Secondary | ICD-10-CM

## 2021-08-08 NOTE — Progress Notes (Signed)
Follow up visit  Subjective:   Glenn Wheeler, male    DOB: 08-17-1955, 66 y.o.   MRN: [id number]     HPI  No chief complaint on file.   66 y.o. Caucasian male with hypertension, hyperlipidemia, CAD with 40% distal left main calcific stenosis with favorable IVUS area (8.2 mm2) on cath (07/2018), h/o Leishmaniasis in 2013, h/o PE (2014,)  former smoker, with exertional palpitation symptoms.    Patient is doing well, has not had any recurrent exertional palpitation, chest pain, dyspnea symptoms.  Reviewed recent lab results with the patient, details below.   Current Outpatient Medications:    acetaminophen (TYLENOL) 325 MG tablet, Take by mouth., Disp: , Rfl:    aspirin 81 MG tablet, Take 162 mg by mouth at bedtime. , Disp: , Rfl:    atorvastatin (LIPITOR) 40 MG tablet, Take 1 tablet (40 mg total) by mouth at bedtime., Disp: 90 tablet, Rfl: 3   metoprolol succinate (TOPROL-XL) 25 MG 24 hr tablet, TAKE 1 TABLET DAILY, Disp: 90 tablet, Rfl: 3   Multiple Vitamins-Minerals (MULTIVITAMIN WITH MINERALS) tablet, Take 1 tablet by mouth daily., Disp: , Rfl:    nitroGLYCERIN (NITROSTAT) 0.4 MG SL tablet, Place 1 tablet (0.4 mg total) under the tongue every 5 (five) minutes as needed for chest pain., Disp: 30 tablet, Rfl: 2   Cardiovascular & other pertient studies:  Reviewed external labs and tests, independently interpreted  EKG 03/31/2021: Sinus rhythm 61 bpm Normal EKG   Recent labs: 04/12/2021: Glucose 112, BUN/Cr 27/1.14. EGFR 70. Na/K 140/4.1. Rest of the CMP normal H/H 15/45. MCV 89. Platelets 127 HbA1C NA Chol 152, TG 167, HDL 43, LDL 76 TSH 0.7 normal    Review of Systems  Cardiovascular:  Negative for chest pain, dyspnea on exertion, leg swelling, palpitations and syncope.         Vitals:   08/08/21 0819  BP: 135/79  Pulse: 68  Resp: 16  Temp: 97.7 F (36.5 C)  SpO2: 98%    Body mass index is 30.52 kg/m. Filed Weights   08/08/21 0819  Weight: 225 lb  (102.1 kg)     Objective:   Physical Exam Vitals and nursing note reviewed.  Constitutional:      General: He is not in acute distress. Neck:     Vascular: No JVD.  Cardiovascular:     Rate and Rhythm: Normal rate and regular rhythm.     Heart sounds: Normal heart sounds. No murmur heard. Pulmonary:     Effort: Pulmonary effort is normal.     Breath sounds: Normal breath sounds. No wheezing or rales.  Musculoskeletal:     Right lower leg: No edema.     Left lower leg: No edema.             Visit diagnoses:   ICD-10-CM   1. Palpitations  R00.2     2. Coronary artery disease involving native coronary artery of native heart with other form of angina pectoris (HCC)  I25.118         Assessment & Recommendations:   66 y.o. Caucasian male with hypertension, hyperlipidemia, CAD with 40% distal left main calcific stenosis with favorable IVUS area (8.2 mm2) on cath (07/2018), h/o Leishmaniasis in 2013, h/o PE (2014,)  former smoker, with exertional palpitation symptoms.  Palpitations: Currently absent. Continue medical management for coronary artery disease, including aspirin, statin, metoprolol succinate 25 mg daily. Continue follow-up with PCP.  Follow-up with me in 1 year.  Nigel Mormon, MD Pager: [phone] Office: [phone]

## 2021-12-21 ENCOUNTER — Inpatient Hospital Stay: Payer: Medicare Other

## 2021-12-21 ENCOUNTER — Other Ambulatory Visit: Payer: Self-pay

## 2021-12-21 ENCOUNTER — Encounter: Payer: Self-pay | Admitting: Hematology & Oncology

## 2021-12-21 ENCOUNTER — Inpatient Hospital Stay: Payer: Medicare Other | Attending: Hematology & Oncology | Admitting: Hematology & Oncology

## 2021-12-21 VITALS — BP 124/73 | HR 62 | Temp 97.7°F | Resp 17 | Ht 71.5 in | Wt 230.0 lb

## 2021-12-21 DIAGNOSIS — Z86711 Personal history of pulmonary embolism: Secondary | ICD-10-CM | POA: Diagnosis not present

## 2021-12-21 DIAGNOSIS — I82401 Acute embolism and thrombosis of unspecified deep veins of right lower extremity: Secondary | ICD-10-CM | POA: Insufficient documentation

## 2021-12-21 DIAGNOSIS — Z7901 Long term (current) use of anticoagulants: Secondary | ICD-10-CM | POA: Diagnosis not present

## 2021-12-21 DIAGNOSIS — I2782 Chronic pulmonary embolism: Secondary | ICD-10-CM

## 2021-12-21 LAB — CBC WITH DIFFERENTIAL (CANCER CENTER ONLY)
Abs Immature Granulocytes: 0.01 10*3/uL (ref 0.00–0.07)
Basophils Absolute: 0 10*3/uL (ref 0.0–0.1)
Basophils Relative: 1 %
Eosinophils Absolute: 0.1 10*3/uL (ref 0.0–0.5)
Eosinophils Relative: 2 %
HCT: 43.8 % (ref 39.0–52.0)
Hemoglobin: 14.8 g/dL (ref 13.0–17.0)
Immature Granulocytes: 0 %
Lymphocytes Relative: 34 %
Lymphs Abs: 1.7 10*3/uL (ref 0.7–4.0)
MCH: 30.3 pg (ref 26.0–34.0)
MCHC: 33.8 g/dL (ref 30.0–36.0)
MCV: 89.6 fL (ref 80.0–100.0)
Monocytes Absolute: 0.3 10*3/uL (ref 0.1–1.0)
Monocytes Relative: 7 %
Neutro Abs: 2.8 10*3/uL (ref 1.7–7.7)
Neutrophils Relative %: 56 %
Platelet Count: 151 10*3/uL (ref 150–400)
RBC: 4.89 MIL/uL (ref 4.22–5.81)
RDW: 13.2 % (ref 11.5–15.5)
WBC Count: 5 10*3/uL (ref 4.0–10.5)
nRBC: 0 % (ref 0.0–0.2)

## 2021-12-21 LAB — CMP (CANCER CENTER ONLY)
ALT: 29 U/L (ref 0–44)
AST: 27 U/L (ref 15–41)
Albumin: 4.4 g/dL (ref 3.5–5.0)
Alkaline Phosphatase: 80 U/L (ref 38–126)
Anion gap: 7 (ref 5–15)
BUN: 24 mg/dL — ABNORMAL HIGH (ref 8–23)
CO2: 29 mmol/L (ref 22–32)
Calcium: 9.1 mg/dL (ref 8.9–10.3)
Chloride: 104 mmol/L (ref 98–111)
Creatinine: 1.16 mg/dL (ref 0.61–1.24)
GFR, Estimated: >60 mL/min (ref >60)
Glucose, Bld: 129 mg/dL — ABNORMAL HIGH (ref 70–99)
Potassium: 3.8 mmol/L (ref 3.5–5.1)
Sodium: 140 mmol/L (ref 135–145)
Total Bilirubin: 0.8 mg/dL (ref 0.3–1.2)
Total Protein: 6.6 g/dL (ref 6.5–8.1)

## 2021-12-21 LAB — D-DIMER, QUANTITATIVE: D-Dimer, Quant: <0.27 ug/mL-FEU (ref 0.00–0.50)

## 2021-12-21 NOTE — Progress Notes (Signed)
Referral MD  Reason for Referral: Recurrent thromboembolic event-right lower leg; past history of pulmonary embolism  Chief Complaint  Patient presents with   New Patient (Initial Visit)  : I have another blood clot.  HPI: [name] is known to me.  It has been about a year since I saw him.  He is very nice 66 year old white male.  I first saw him back in 2014.  At that time, he had a pulmonary embolism.  He was on Xarelto for 2 years.  He does on baby aspirin.  At that time, we did a hypercoagulable workup.  Thankfully, this did not show any obvious thrombophilic state.  He subsequently was placed on 2 baby aspirin.  He has been doing well.  Recently, he began to have some pain and swelling in the right leg.  He said that he had driven to [place] about 3 weeks before.  He said that he had gotten out every hour or so to walk around.  He ultimately went to the urgent care.  He was found to have an elevated D-Dimer.  He then underwent a Doppler.  He had a Doppler of the right leg done.  This was done on 12/06/2021.  This showed that he had a acute thrombus in the posterior tibial vein in the popliteal vein.  He was placed on Xarelto.  He currently is on a Xarelto starter pack.  He says his leg is feeling a little bit better.  He does have a compression stocking.  Otherwise, he has been doing quite well.  There is really been no change in medications.  He has had no problems with COVID.  He does not smoke.  There is been no change in bowel or bladder habits.  He says that his last colonoscopy was about 5 years ago.  He has had no weight loss or weight gain.  He is not a vegetarian.  Overall, I would say his performance status is probably ECOG 0.     Past Medical History:  Diagnosis Date   Chest pain    Colon polyps    2001, 2007   Exertional angina 07/22/2018   History of chicken pox    childhood   Hyperlipidemia    Leishmaniasis 05-2011   Pulmonary embolus ([place])   :   Past  Surgical History:  Procedure Laterality Date   CATARACT EXTRACTION, BILATERAL  2009   [facility]   COLONOSCOPY  [phone]   polyps removed both times   INTRAVASCULAR ULTRASOUND/IVUS N/A 07/23/2018   Procedure: Intravascular Ultrasound/IVUS;  Surgeon: Nigel Mormon, MD;  Location: [facility];  Service: Cardiovascular;  Laterality: N/A;   LEFT HEART CATH AND CORONARY ANGIOGRAPHY N/A 07/23/2018   Procedure: LEFT HEART CATH AND CORONARY ANGIOGRAPHY;  Surgeon: Nigel Mormon, MD;  Location: [facility];  Service: Cardiovascular;  Laterality: N/A;   ROTATOR CUFF REPAIR  2007   left rotator cuff-[facility]   TENDON REPAIR  2005   right peroneal tendon-[facility]  :   Current Outpatient Medications:    rivaroxaban (XARELTO) 20 MG TABS tablet, Take 20 mg by mouth at bedtime., Disp: , Rfl:    sildenafil (REVATIO) 20 MG tablet, Take 20 mg by mouth as needed., Disp: , Rfl:    acetaminophen (TYLENOL) 325 MG tablet, Take by mouth., Disp: , Rfl:    atorvastatin (LIPITOR) 40 MG tablet, Take 1 tablet (40 mg total) by mouth at bedtime., Disp: 90 tablet, Rfl:  3   metoprolol succinate (TOPROL-XL) 25 MG 24 hr tablet, TAKE 1 TABLET DAILY, Disp: 90 tablet, Rfl: 3   Multiple Vitamins-Minerals (MULTIVITAMIN WITH MINERALS) tablet, Take 1 tablet by mouth daily., Disp: , Rfl:    nitroGLYCERIN (NITROSTAT) 0.4 MG SL tablet, Place 1 tablet (0.4 mg total) under the tongue every 5 (five) minutes as needed for chest pain., Disp: 30 tablet, Rfl: 2:  :  No Known Allergies:   Family History  Problem Relation Age of Onset   Prostate cancer Father        alive   Clotting disorder Father        blood clots   Hyperlipidemia Mother    Hypertension Mother    Diabetes Mother    Asthma Mother    Stroke Paternal Grandfather    Stroke Maternal Grandfather    Heart attack Neg Hx    Sudden death Neg Hx    Colon cancer Neg Hx   :   Social History    Socioeconomic History   Marital status: Married    Spouse name: Not on file   Number of children: 3   Years of education: Not on file   Highest education level: Not on file  Occupational History   Not on file  Tobacco Use   Smoking status: Former    Packs/day: 1.00    Years: 7.00    Total pack years: 7.00    Types: Cigarettes    Quit date: 06/20/1983    Years since quitting: 38.5   Smokeless tobacco: Never   Tobacco comments:    quit 1984 1/2 ppd for 4 years  Substance and Sexual Activity   Alcohol use: No   Drug use: No   Sexual activity: Not on file  Other Topics Concern   Not on file  Social History Narrative   Married, 3 children   Right handed   Masters degree   1 cup daily   Social Determinants of Health   Financial Resource Strain: Not on file  Food Insecurity: Not on file  Transportation Needs: Not on file  Physical Activity: Not on file  Stress: Not on file  Social Connections: Not on file  Intimate Partner Violence: Not on file  :  Review of Systems  Constitutional: Negative.   HENT: Negative.    Eyes: Negative.   Cardiovascular: Negative.   Gastrointestinal: Negative.   Genitourinary: Negative.   Musculoskeletal:  Positive for myalgias.  Skin: Negative.   Neurological: Negative.   Endo/Heme/Allergies: Negative.   Psychiatric/Behavioral: Negative.       Exam: His vital signs show temperature of 97 show temperature of 1 7.  Pulse is 62.  Blood pressure 124/73.  Weight is 230 pounds.  '@IPVITALS'$ @ Physical Exam Vitals reviewed.  HENT:     Head: Normocephalic and atraumatic.  Eyes:     Pupils: Pupils are equal, round, and reactive to light.  Cardiovascular:     Rate and Rhythm: Normal rate and regular rhythm.     Heart sounds: Normal heart sounds.  Pulmonary:     Effort: Pulmonary effort is normal.     Breath sounds: Normal breath sounds.  Abdominal:     General: Bowel sounds are normal.     Palpations: Abdomen is soft.   Musculoskeletal:        General: No tenderness or deformity. Normal range of motion.     Cervical back: Normal range of motion.     Comments: Extremities shows some swelling of  the right leg.  He has a compression stocking on the right leg.  No obvious venous cord is palpable.  He has a Bevelyn Buckles' sign on the right leg.  Left leg is unremarkable.  Lymphadenopathy:     Cervical: No cervical adenopathy.  Skin:    General: Skin is warm and dry.     Findings: No erythema or rash.  Neurological:     Mental Status: He is alert and oriented to person, place, and time.  Psychiatric:        Behavior: Behavior normal.        Thought Content: Thought content normal.        Judgment: Judgment normal.     Recent Labs    12/21/21 1347  WBC 5.0  HGB 14.8  HCT 43.8  PLT 151    Recent Labs    12/21/21 1347  NA 140  K 3.8  CL 104  CO2 29  GLUCOSE 129*  BUN 24*  CREATININE 1.16  CALCIUM 9.1    Blood smear review: None   Pathology: None    Assessment and Plan: Glenn Wheeler is a very nice 66 year old white male.  He has a history of a idiopathic pulmonary embolism back in 2014.  He now has a thrombus in the right leg.  Unfortunately, I think this probably indicates he is going to need lifelong anticoagulation.  I really hate this for him.  We will go ahead and keep him on the Xarelto.  I will keep him on the full dose Xarelto for about a year and then we can we will see about maintenance Xarelto for him.  I would like to get a Doppler of his right leg in about 2 months.  I would like to see how this thromboembolic disease is responding to the Xarelto.  Again I do not see any factor that would have caused this outside the fact that he had driven down to [place].  He says that the leg is feeling better so this is always a good sign.  I would like to see him back in January.  We will get the Doppler of his leg the same day that I see him.

## 2021-12-30 ENCOUNTER — Other Ambulatory Visit: Payer: Self-pay | Admitting: *Deleted

## 2021-12-30 MED ORDER — RIVAROXABAN 20 MG PO TABS: 20.0000 mg | ORAL_TABLET | Freq: Every day | ORAL | 11 refills | Status: DC

## 2022-01-19 ENCOUNTER — Other Ambulatory Visit: Payer: Self-pay | Admitting: *Deleted

## 2022-01-19 ENCOUNTER — Other Ambulatory Visit: Payer: Self-pay

## 2022-01-19 ENCOUNTER — Telehealth: Payer: Self-pay | Admitting: *Deleted

## 2022-01-19 ENCOUNTER — Ambulatory Visit (HOSPITAL_BASED_OUTPATIENT_CLINIC_OR_DEPARTMENT_OTHER)
Admission: RE | Admit: 2022-01-19 | Discharge: 2022-01-19 | Disposition: A | Discharge status: Home / Self Care | Payer: Medicare Other | Source: Ambulatory Visit | Attending: Hematology & Oncology | Admitting: Hematology & Oncology

## 2022-01-19 DIAGNOSIS — M7989 Other specified soft tissue disorders: Secondary | ICD-10-CM

## 2022-01-19 DIAGNOSIS — I82401 Acute embolism and thrombosis of unspecified deep veins of right lower extremity: Secondary | ICD-10-CM

## 2022-01-19 NOTE — Telephone Encounter (Signed)
Call received from patient stating that he is having new right knee pain and slight swelling to right leg that started yesterday.  Dr. Marin Olp notified.  Call placed back to patient and patient notified that Dr. Marin Olp would like for him to have a doppler of his right leg done today and that [facility] radiology can do it today at [place] is agreeable to this time and has no questions at this time.

## 2022-01-20 ENCOUNTER — Other Ambulatory Visit: Payer: Self-pay

## 2022-01-20 ENCOUNTER — Inpatient Hospital Stay: Payer: Medicare Other | Attending: Hematology & Oncology

## 2022-01-20 ENCOUNTER — Encounter: Payer: Self-pay | Admitting: Hematology & Oncology

## 2022-01-20 ENCOUNTER — Inpatient Hospital Stay (HOSPITAL_BASED_OUTPATIENT_CLINIC_OR_DEPARTMENT_OTHER): Payer: Medicare Other | Admitting: Hematology & Oncology

## 2022-01-20 VITALS — BP 141/70 | HR 59 | Temp 98.1°F | Resp 18 | Ht 72.0 in | Wt 227.8 lb

## 2022-01-20 DIAGNOSIS — I2601 Septic pulmonary embolism with acute cor pulmonale: Secondary | ICD-10-CM | POA: Diagnosis not present

## 2022-01-20 DIAGNOSIS — I82441 Acute embolism and thrombosis of right tibial vein: Secondary | ICD-10-CM | POA: Diagnosis present

## 2022-01-20 DIAGNOSIS — Z7901 Long term (current) use of anticoagulants: Secondary | ICD-10-CM | POA: Insufficient documentation

## 2022-01-20 LAB — CBC WITH DIFFERENTIAL (CANCER CENTER ONLY)
Abs Immature Granulocytes: 0.01 10*3/uL (ref 0.00–0.07)
Basophils Absolute: 0 10*3/uL (ref 0.0–0.1)
Basophils Relative: 1 %
Eosinophils Absolute: 0.2 10*3/uL (ref 0.0–0.5)
Eosinophils Relative: 3 %
HCT: 44.4 % (ref 39.0–52.0)
Hemoglobin: 15.1 g/dL (ref 13.0–17.0)
Immature Granulocytes: 0 %
Lymphocytes Relative: 37 %
Lymphs Abs: 1.8 10*3/uL (ref 0.7–4.0)
MCH: 30.3 pg (ref 26.0–34.0)
MCHC: 34 g/dL (ref 30.0–36.0)
MCV: 89 fL (ref 80.0–100.0)
Monocytes Absolute: 0.5 10*3/uL (ref 0.1–1.0)
Monocytes Relative: 10 %
Neutro Abs: 2.4 10*3/uL (ref 1.7–7.7)
Neutrophils Relative %: 49 %
Platelet Count: 130 10*3/uL — ABNORMAL LOW (ref 150–400)
RBC: 4.99 MIL/uL (ref 4.22–5.81)
RDW: 13.3 % (ref 11.5–15.5)
WBC Count: 4.9 10*3/uL (ref 4.0–10.5)
nRBC: 0 % (ref 0.0–0.2)

## 2022-01-20 LAB — CMP (CANCER CENTER ONLY)
ALT: 33 U/L (ref 0–44)
AST: 32 U/L (ref 15–41)
Albumin: 4.5 g/dL (ref 3.5–5.0)
Alkaline Phosphatase: 81 U/L (ref 38–126)
Anion gap: 6 (ref 5–15)
BUN: 26 mg/dL — ABNORMAL HIGH (ref 8–23)
CO2: 31 mmol/L (ref 22–32)
Calcium: 9.3 mg/dL (ref 8.9–10.3)
Chloride: 105 mmol/L (ref 98–111)
Creatinine: 1.39 mg/dL — ABNORMAL HIGH (ref 0.61–1.24)
GFR, Estimated: 55 mL/min — ABNORMAL LOW (ref >60)
Glucose, Bld: 92 mg/dL (ref 70–99)
Potassium: 4.3 mmol/L (ref 3.5–5.1)
Sodium: 142 mmol/L (ref 135–145)
Total Bilirubin: 0.9 mg/dL (ref 0.3–1.2)
Total Protein: 6.9 g/dL (ref 6.5–8.1)

## 2022-01-20 LAB — D-DIMER, QUANTITATIVE: D-Dimer, Quant: <0.27 ug/mL-FEU (ref 0.00–0.50)

## 2022-01-20 MED ORDER — DABIGATRAN ETEXILATE MESYLATE 150 MG PO CAPS: 150.0000 mg | ORAL_CAPSULE | Freq: Two times a day (BID) | ORAL | 6 refills | Status: DC

## 2022-01-20 NOTE — Progress Notes (Signed)
Hematology and Oncology Follow Up Visit  Glenn CORRIHER [id number] 09-06-1955 66 y.o. 01/20/2022   Principle Diagnosis:  Occlusive thromboembolism of right peroneal vein  Current Therapy:   Pradaxa 150 mg p.o. twice daily -- start on 01/20/2022     Interim History:  Glenn Wheeler is back for an early visit.  He had been on Xarelto for a recurrent thromboembolism.  He says that his leg started swelling up on him yesterday.  He and his wife had gone on a trip down to [place] to see Christmas lights.  As always, he is incredibly diligent with stopping to make sure he gets out and walks.  We did do a Doppler of his right leg.  This was done on 01/19/2022.  This did show a occlusive thrombus in the right peroneal vein.  There is no extension into the more proximal system.  This is a bit confusing because on his Doppler that he had back in the October, there was a thrombus in the right posterior tibial vein.  Again I think the problem is that this thrombus is occlusive.  I think we will going to have to make a change to a different anticoagulant.  I think we need 1 that has a different mode of action.  I think that using Pradaxa may not be a bad idea.  I think this would be reasonable since this is a direct thrombin inhibitor.  His D-dimer was actually normal.  His labs all look pretty good.  On the Doppler, he did have a Baker's cyst.  I will think this was really a problem for him.  He has had no cough or shortness of breath.  He has had no chest wall pain.  He has had no bleeding.  He has had no headache.  Overall, I would say his performance status is probably ECOG 1.  Medications:  Current Outpatient Medications:    acetaminophen (TYLENOL) 325 MG tablet, Take by mouth., Disp: , Rfl:    atorvastatin (LIPITOR) 40 MG tablet, Take 1 tablet (40 mg total) by mouth at bedtime., Disp: 90 tablet, Rfl: 3   dabigatran (PRADAXA) 150 MG CAPS capsule, Take 1 capsule (150 mg total) by mouth 2 (two)  times daily., Disp: 60 capsule, Rfl: 6   metoprolol succinate (TOPROL-XL) 25 MG 24 hr tablet, TAKE 1 TABLET DAILY, Disp: 90 tablet, Rfl: 3   Multiple Vitamins-Minerals (MULTIVITAMIN WITH MINERALS) tablet, Take 1 tablet by mouth daily., Disp: , Rfl:    sildenafil (REVATIO) 20 MG tablet, Take 20 mg by mouth as needed., Disp: , Rfl:    nitroGLYCERIN (NITROSTAT) 0.4 MG SL tablet, Place 1 tablet (0.4 mg total) under the tongue every 5 (five) minutes as needed for chest pain., Disp: 30 tablet, Rfl: 2  Allergies: No Known Allergies  Past Medical History, Surgical history, Social history, and Family History were reviewed and updated.  Review of Systems: Review of Systems  Constitutional: Negative.   HENT:  Negative.    Eyes: Negative.   Respiratory: Negative.    Cardiovascular:  Positive for leg swelling.  Gastrointestinal: Negative.   Endocrine: Negative.   Genitourinary: Negative.    Musculoskeletal:  Positive for arthralgias and myalgias.  Skin: Negative.   Neurological: Negative.   Hematological: Negative.   Psychiatric/Behavioral: Negative.      Physical Exam:  height is 6' (1.829 m) and weight is 227 lb 12.8 oz (103.3 kg). His oral temperature is 98.1 F (36.7 C). His blood pressure is 141/70 (abnormal)  and his pulse is 59 (abnormal). His respiration is 18 and oxygen saturation is 98%.   Wt Readings from Last 3 Encounters:  01/20/22 227 lb 12.8 oz (103.3 kg)  12/21/21 230 lb (104.3 kg)  08/08/21 225 lb (102.1 kg)    Physical Exam Vitals reviewed.  HENT:     Head: Normocephalic and atraumatic.  Eyes:     Pupils: Pupils are equal, round, and reactive to light.  Cardiovascular:     Rate and Rhythm: Normal rate and regular rhythm.     Heart sounds: Normal heart sounds.  Pulmonary:     Effort: Pulmonary effort is normal.     Breath sounds: Normal breath sounds.  Abdominal:     General: Bowel sounds are normal.     Palpations: Abdomen is soft.  Musculoskeletal:         General: No tenderness or deformity. Normal range of motion.     Cervical back: Normal range of motion.     Comments: With his extremities, he does have some swelling in the right lower extremity.  He has a negative Homans sign.  I cannot palpate any obvious venous cord.  I cannot palpate any obvious Baker's cyst.  Maybe a little bit of swelling in the right thigh.  His left leg is unremarkable.  He has good pulses in his distal extremities bilaterally.  Lymphadenopathy:     Cervical: No cervical adenopathy.  Skin:    General: Skin is warm and dry.     Findings: No erythema or rash.  Neurological:     Mental Status: He is alert and oriented to person, place, and time.  Psychiatric:        Behavior: Behavior normal.        Thought Content: Thought content normal.        Judgment: Judgment normal.      Lab Results  Component Value Date   WBC 4.9 01/20/2022   HGB 15.1 01/20/2022   HCT 44.4 01/20/2022   MCV 89.0 01/20/2022   PLT 130 (L) 01/20/2022     Chemistry      Component Value Date/Time   NA 142 01/20/2022 1152   NA 143 06/05/2013 0919   K 4.3 01/20/2022 1152   CL 105 01/20/2022 1152   CO2 31 01/20/2022 1152   BUN 26 (H) 01/20/2022 1152   BUN 29 (H) 06/05/2013 0919   CREATININE 1.39 (H) 01/20/2022 1152   CREATININE 1.92 (H) 04/28/2013 1027      Component Value Date/Time   CALCIUM 9.3 01/20/2022 1152   ALKPHOS 81 01/20/2022 1152   AST 32 01/20/2022 1152   ALT 33 01/20/2022 1152   BILITOT 0.9 01/20/2022 1152       Impression and Plan: Glenn Wheeler is a very nice 66 year old white male.  He has a history of recurrent thromboembolism.  He had been on Xarelto for quite a while.  We then had him on aspirin.  He had not seen [place] for several years.  He then presented with another thrombus in the right posterior tibial vein and popliteal vein.  We had him on Xarelto.  Again we will get have to make a switch.  I think Pradaxa would be reasonable since this is a direct thrombin  inhibitor.  I will treat him with the Pradaxa at 150 mg p.o. twice daily.  We will plan to get him back to see [place] in another couple weeks or so.  I probably would not do another Doppler  on him for about 6 weeks or so.  I do think that his right leg swelling will tell us how things are working.   Volanda Napoleon, MD 12/22/202312:47 PM

## 2022-02-08 ENCOUNTER — Encounter: Payer: Self-pay | Admitting: Hematology & Oncology

## 2022-02-08 ENCOUNTER — Inpatient Hospital Stay (HOSPITAL_BASED_OUTPATIENT_CLINIC_OR_DEPARTMENT_OTHER): Payer: Medicare Other | Admitting: Hematology & Oncology

## 2022-02-08 ENCOUNTER — Other Ambulatory Visit (HOSPITAL_BASED_OUTPATIENT_CLINIC_OR_DEPARTMENT_OTHER): Payer: PRIVATE HEALTH INSURANCE

## 2022-02-08 ENCOUNTER — Inpatient Hospital Stay: Payer: Medicare Other | Attending: Hematology & Oncology

## 2022-02-08 VITALS — BP 131/77 | HR 68 | Temp 98.1°F | Resp 17 | Wt 232.1 lb

## 2022-02-08 DIAGNOSIS — I824Z1 Acute embolism and thrombosis of unspecified deep veins of right distal lower extremity: Secondary | ICD-10-CM | POA: Diagnosis not present

## 2022-02-08 DIAGNOSIS — I2601 Septic pulmonary embolism with acute cor pulmonale: Secondary | ICD-10-CM

## 2022-02-08 DIAGNOSIS — I82531 Chronic embolism and thrombosis of right popliteal vein: Secondary | ICD-10-CM | POA: Insufficient documentation

## 2022-02-08 DIAGNOSIS — Z7901 Long term (current) use of anticoagulants: Secondary | ICD-10-CM | POA: Insufficient documentation

## 2022-02-08 DIAGNOSIS — I82541 Chronic embolism and thrombosis of right tibial vein: Secondary | ICD-10-CM | POA: Diagnosis not present

## 2022-02-08 LAB — CMP (CANCER CENTER ONLY)
ALT: 32 U/L (ref 0–44)
AST: 30 U/L (ref 15–41)
Albumin: 4.3 g/dL (ref 3.5–5.0)
Alkaline Phosphatase: 80 U/L (ref 38–126)
Anion gap: 7 (ref 5–15)
BUN: 30 mg/dL — ABNORMAL HIGH (ref 8–23)
CO2: 29 mmol/L (ref 22–32)
Calcium: 9.7 mg/dL (ref 8.9–10.3)
Chloride: 104 mmol/L (ref 98–111)
Creatinine: 1.26 mg/dL — ABNORMAL HIGH (ref 0.61–1.24)
GFR, Estimated: >60 mL/min (ref >60)
Glucose, Bld: 92 mg/dL (ref 70–99)
Potassium: 4.6 mmol/L (ref 3.5–5.1)
Sodium: 140 mmol/L (ref 135–145)
Total Bilirubin: 0.7 mg/dL (ref 0.3–1.2)
Total Protein: 6.9 g/dL (ref 6.5–8.1)

## 2022-02-08 LAB — CBC WITH DIFFERENTIAL (CANCER CENTER ONLY)
Abs Immature Granulocytes: 0.01 10*3/uL (ref 0.00–0.07)
Basophils Absolute: 0.1 10*3/uL (ref 0.0–0.1)
Basophils Relative: 1 %
Eosinophils Absolute: 0.1 10*3/uL (ref 0.0–0.5)
Eosinophils Relative: 2 %
HCT: 42.8 % (ref 39.0–52.0)
Hemoglobin: 14.6 g/dL (ref 13.0–17.0)
Immature Granulocytes: 0 %
Lymphocytes Relative: 27 %
Lymphs Abs: 1.6 10*3/uL (ref 0.7–4.0)
MCH: 30.7 pg (ref 26.0–34.0)
MCHC: 34.1 g/dL (ref 30.0–36.0)
MCV: 90.1 fL (ref 80.0–100.0)
Monocytes Absolute: 0.6 10*3/uL (ref 0.1–1.0)
Monocytes Relative: 11 %
Neutro Abs: 3.3 10*3/uL (ref 1.7–7.7)
Neutrophils Relative %: 59 %
Platelet Count: 128 10*3/uL — ABNORMAL LOW (ref 150–400)
RBC: 4.75 MIL/uL (ref 4.22–5.81)
RDW: 13.4 % (ref 11.5–15.5)
WBC Count: 5.7 10*3/uL (ref 4.0–10.5)
nRBC: 0 % (ref 0.0–0.2)

## 2022-02-08 LAB — D-DIMER, QUANTITATIVE: D-Dimer, Quant: <0.27 ug/mL-FEU (ref 0.00–0.50)

## 2022-02-08 NOTE — Progress Notes (Signed)
Hematology and Oncology Follow Up Visit  SHERRI LEVENHAGEN [id number] [birth year] 67 y.o. 02/08/2022   Principle Diagnosis:  Occlusive thromboembolism of right peroneal vein  Current Therapy:   Pradaxa 150 mg p.o. twice daily -- start on 01/20/2022     Interim History:  Mr. Perry is back for follow-up.  He is doing well.  He says his right leg is not as swollen or painful.  He still little bit tight up in the thigh.  There is a little bit of pain in the anterior knee.  This sounds more like bursitis.  He has had no problems with cough or shortness of breath.  There is been no issues with bleeding.  He is on Pradaxa.  He has had no change in bowel or bladder habits.  There is been no problems with rashes.  He has had no numbness or tingling in the hands or feet.  He has had no nausea or vomiting.  Overall, I would say that his performance status is ECOG 0.   He has had no cough or shortness of breath.  He has had no chest wall pain.  He has had no bleeding.  He has had no headache.  Overall, I would say his performance status is probably ECOG 1.  Medications:  Current Outpatient Medications:    acetaminophen (TYLENOL) 325 MG tablet, Take by mouth., Disp: , Rfl:    atorvastatin (LIPITOR) 40 MG tablet, Take 1 tablet (40 mg total) by mouth at bedtime., Disp: 90 tablet, Rfl: 3   dabigatran (PRADAXA) 150 MG CAPS capsule, Take 1 capsule (150 mg total) by mouth 2 (two) times daily., Disp: 60 capsule, Rfl: 6   metoprolol succinate (TOPROL-XL) 25 MG 24 hr tablet, TAKE 1 TABLET DAILY, Disp: 90 tablet, Rfl: 3   Multiple Vitamins-Minerals (MULTIVITAMIN WITH MINERALS) tablet, Take 1 tablet by mouth daily., Disp: , Rfl:    sildenafil (REVATIO) 20 MG tablet, Take 20 mg by mouth as needed., Disp: , Rfl:    nitroGLYCERIN (NITROSTAT) 0.4 MG SL tablet, Place 1 tablet (0.4 mg total) under the tongue every 5 (five) minutes as needed for chest pain. (Patient not taking: Reported on 02/08/2022), Disp: 30 tablet,  Rfl: 2  Allergies: No Known Allergies  Past Medical History, Surgical history, Social history, and Family History were reviewed and updated.  Review of Systems: Review of Systems  Constitutional: Negative.   HENT:  Negative.    Eyes: Negative.   Respiratory: Negative.    Cardiovascular:  Positive for leg swelling.  Gastrointestinal: Negative.   Endocrine: Negative.   Genitourinary: Negative.    Musculoskeletal:  Positive for arthralgias and myalgias.  Skin: Negative.   Neurological: Negative.   Hematological: Negative.   Psychiatric/Behavioral: Negative.      Physical Exam:  weight is 232 lb 1.9 oz (105.3 kg). His oral temperature is 98.1 F (36.7 C). His blood pressure is 131/77 and his pulse is 68. His respiration is 17 and oxygen saturation is 97%.   Wt Readings from Last 3 Encounters:  02/08/22 232 lb 1.9 oz (105.3 kg)  01/20/22 227 lb 12.8 oz (103.3 kg)  12/21/21 230 lb (104.3 kg)    Physical Exam Vitals reviewed.  HENT:     Head: Normocephalic and atraumatic.  Eyes:     Pupils: Pupils are equal, round, and reactive to light.  Cardiovascular:     Rate and Rhythm: Normal rate and regular rhythm.     Heart sounds: Normal heart sounds.  Pulmonary:  Effort: Pulmonary effort is normal.     Breath sounds: Normal breath sounds.  Abdominal:     General: Bowel sounds are normal.     Palpations: Abdomen is soft.  Musculoskeletal:        General: No tenderness or deformity. Normal range of motion.     Cervical back: Normal range of motion.     Comments: With his extremities, he does have some swelling in the right lower extremity.  He has a negative Homans sign.  I cannot palpate any obvious venous cord.  I cannot palpate any obvious Baker's cyst.  Maybe a little bit of swelling in the right thigh.  His left leg is unremarkable.  He has good pulses in his distal extremities bilaterally.  Lymphadenopathy:     Cervical: No cervical adenopathy.  Skin:    General: Skin  is warm and dry.     Findings: No erythema or rash.  Neurological:     Mental Status: He is alert and oriented to person, place, and time.  Psychiatric:        Behavior: Behavior normal.        Thought Content: Thought content normal.        Judgment: Judgment normal.     Lab Results  Component Value Date   WBC 5.7 02/08/2022   HGB 14.6 02/08/2022   HCT 42.8 02/08/2022   MCV 90.1 02/08/2022   PLT 128 (L) 02/08/2022     Chemistry      Component Value Date/Time   NA 140 02/08/2022 1050   NA 143 06/05/2013 0919   K 4.6 02/08/2022 1050   CL 104 02/08/2022 1050   CO2 29 02/08/2022 1050   BUN 30 (H) 02/08/2022 1050   BUN 29 (H) 06/05/2013 0919   CREATININE 1.26 (H) 02/08/2022 1050   CREATININE 1.92 (H) 04/28/2013 1027      Component Value Date/Time   CALCIUM 9.7 02/08/2022 1050   ALKPHOS 80 02/08/2022 1050   AST 30 02/08/2022 1050   ALT 32 02/08/2022 1050   BILITOT 0.7 02/08/2022 1050       Impression and Plan: Mr. Orrego is a very nice 67 year old white male.  He has a history of recurrent thromboembolism.  He had been on Xarelto for quite a while.  We then had him on aspirin.  He had not seen [place] for several years.  He then presented with another thrombus in the right posterior tibial vein and popliteal vein.  He is on Pradaxa.  The D-dimer is normal.  Hopefully this is a good sign that there is no active thrombosis.  We will have to repeat a Doppler of the right leg.  I will do this when I see him back.  I would like to see him back in a couple months.  By then, he would have been on the Pradaxa for almost 3 months.  He is going to do a little driving.  Told to make sure he stops every hour or so.  He must drink a lot of fluid.  He needs to get out of the car and make sure he walks around and stretches.   Volanda Napoleon, MD 1/10/20241:07 PM

## 2022-04-04 ENCOUNTER — Other Ambulatory Visit: Payer: Self-pay | Admitting: Family

## 2022-04-04 DIAGNOSIS — I824Z1 Acute embolism and thrombosis of unspecified deep veins of right distal lower extremity: Secondary | ICD-10-CM

## 2022-04-04 DIAGNOSIS — I2601 Septic pulmonary embolism with acute cor pulmonale: Secondary | ICD-10-CM

## 2022-04-05 ENCOUNTER — Ambulatory Visit: Payer: PRIVATE HEALTH INSURANCE | Admitting: Hematology & Oncology

## 2022-04-05 ENCOUNTER — Encounter: Payer: Self-pay | Admitting: Physician Assistant

## 2022-04-05 ENCOUNTER — Inpatient Hospital Stay: Payer: Medicare Other

## 2022-04-05 ENCOUNTER — Inpatient Hospital Stay (HOSPITAL_BASED_OUTPATIENT_CLINIC_OR_DEPARTMENT_OTHER): Payer: Medicare Other | Admitting: Physician Assistant

## 2022-04-05 ENCOUNTER — Inpatient Hospital Stay: Payer: Medicare Other | Attending: Hematology & Oncology

## 2022-04-05 ENCOUNTER — Ambulatory Visit (HOSPITAL_BASED_OUTPATIENT_CLINIC_OR_DEPARTMENT_OTHER)
Admission: RE | Admit: 2022-04-05 | Discharge: 2022-04-05 | Disposition: A | Discharge status: Home / Self Care | Payer: Medicare Other | Source: Ambulatory Visit | Attending: Hematology & Oncology | Admitting: Hematology & Oncology

## 2022-04-05 ENCOUNTER — Other Ambulatory Visit: Payer: Self-pay

## 2022-04-05 VITALS — BP 139/76 | HR 75 | Temp 98.1°F | Resp 17 | Wt 237.1 lb

## 2022-04-05 DIAGNOSIS — I82441 Acute embolism and thrombosis of right tibial vein: Secondary | ICD-10-CM | POA: Diagnosis not present

## 2022-04-05 DIAGNOSIS — I82409 Acute embolism and thrombosis of unspecified deep veins of unspecified lower extremity: Secondary | ICD-10-CM | POA: Diagnosis not present

## 2022-04-05 DIAGNOSIS — I824Z1 Acute embolism and thrombosis of unspecified deep veins of right distal lower extremity: Secondary | ICD-10-CM | POA: Insufficient documentation

## 2022-04-05 DIAGNOSIS — I2601 Septic pulmonary embolism with acute cor pulmonale: Secondary | ICD-10-CM

## 2022-04-05 DIAGNOSIS — Z7901 Long term (current) use of anticoagulants: Secondary | ICD-10-CM | POA: Insufficient documentation

## 2022-04-05 DIAGNOSIS — I82451 Acute embolism and thrombosis of right peroneal vein: Secondary | ICD-10-CM | POA: Insufficient documentation

## 2022-04-05 LAB — CBC WITH DIFFERENTIAL (CANCER CENTER ONLY)
Abs Immature Granulocytes: 0.01 K/uL (ref 0.00–0.07)
Basophils Absolute: 0 K/uL (ref 0.0–0.1)
Basophils Relative: 1 %
Eosinophils Absolute: 0.1 K/uL (ref 0.0–0.5)
Eosinophils Relative: 2 %
HCT: 43.9 % (ref 39.0–52.0)
Hemoglobin: 14.8 g/dL (ref 13.0–17.0)
Immature Granulocytes: 0 %
Lymphocytes Relative: 27 %
Lymphs Abs: 1.4 K/uL (ref 0.7–4.0)
MCH: 30.3 pg (ref 26.0–34.0)
MCHC: 33.7 g/dL (ref 30.0–36.0)
MCV: 89.8 fL (ref 80.0–100.0)
Monocytes Absolute: 0.4 K/uL (ref 0.1–1.0)
Monocytes Relative: 8 %
Neutro Abs: 3.2 K/uL (ref 1.7–7.7)
Neutrophils Relative %: 62 %
Platelet Count: 123 K/uL — ABNORMAL LOW (ref 150–400)
RBC: 4.89 MIL/uL (ref 4.22–5.81)
RDW: 13.5 % (ref 11.5–15.5)
WBC Count: 5.3 K/uL (ref 4.0–10.5)
nRBC: 0 % (ref 0.0–0.2)

## 2022-04-05 LAB — D-DIMER, QUANTITATIVE: D-Dimer, Quant: <0.27 ug/mL-FEU (ref 0.00–0.50)

## 2022-04-05 LAB — CMP (CANCER CENTER ONLY)
ALT: 31 U/L (ref 0–44)
AST: 35 U/L (ref 15–41)
Albumin: 4.4 g/dL (ref 3.5–5.0)
Alkaline Phosphatase: 74 U/L (ref 38–126)
Anion gap: 7 (ref 5–15)
BUN: 26 mg/dL — ABNORMAL HIGH (ref 8–23)
CO2: 30 mmol/L (ref 22–32)
Calcium: 9.5 mg/dL (ref 8.9–10.3)
Chloride: 104 mmol/L (ref 98–111)
Creatinine: 1.42 mg/dL — ABNORMAL HIGH (ref 0.61–1.24)
GFR, Estimated: 54 mL/min — ABNORMAL LOW
Glucose, Bld: 127 mg/dL — ABNORMAL HIGH (ref 70–99)
Potassium: 4.2 mmol/L (ref 3.5–5.1)
Sodium: 141 mmol/L (ref 135–145)
Total Bilirubin: 0.7 mg/dL (ref 0.3–1.2)
Total Protein: 6.9 g/dL (ref 6.5–8.1)

## 2022-04-05 MED ORDER — ENOXAPARIN SODIUM 100 MG/ML IJ SOSY: 100.0000 mg | PREFILLED_SYRINGE | Freq: Two times a day (BID) | INTRAMUSCULAR | 0 refills | Status: DC

## 2022-04-05 NOTE — Progress Notes (Signed)
Hematology and Oncology Follow Up Visit  Glenn Wheeler [id number] 1955/08/29 67 y.o. 04/05/2022   Principle Diagnosis:  History of pulmonary embolism Recurrent DVT of right lower extremity  History: October 2012: Treated for pulmonary embolism with lovenox and then transitioned to coumadin.  March 2013: Switch to Xarelto therapy for convenience. December 2014: After 2 years of Xarelto therapy, transitioned to ASA 162 mg daily 12/06/2021: Diagnosed with acute thrombus in the posterior tibial vein and popliteal vein. Started on Xarelto therapy 01/19/2022: Doppler US showed occlusive thrombus in the right peroneal vein. Concerned for worsening clot burden, recommend to switch to Pradaxa 150 mg twice daily 04/05/2022: New thrombus in the right popliteal vein.   Current Therapy:   Pradaxa 150 mg p.o. twice daily -- start on 01/20/2022     Interim History:  [name] returns for a follow up for history of recurrent right lower extremity DVT and history of pulmonary embolism. He was last seen by Dr. Marin Olp on 01/19/2022 and started on Pradaxa.   Mr. Johnes is compliant with taking Pradaxa without interruption. He reports some discomfort involving his right knee. He continues to have stable edema in the right leg that is unchanged from the last visit.   He has no other symptoms. His energy is stable and is able to complete his ADLs on his own. He denies being sedentary and gets up and walks routinely. He denies any appetite or weight loss. He denies nausea, vomiting or abdominal pain. His bowel habits are unchanged without recurrent episodes of diarrhea or constipation. He denies easy bruising or signs of active bleeding. He denies fevers, chills, sweats, shortness of breath, chest pain or cough. He has no other complaints.   Medications:  Current Outpatient Medications:    acetaminophen (TYLENOL) 325 MG tablet, Take by mouth., Disp: , Rfl:    atorvastatin (LIPITOR) 40 MG tablet, Take 1 tablet (40  mg total) by mouth at bedtime., Disp: 90 tablet, Rfl: 3   enoxaparin (LOVENOX) 100 MG/ML injection, Inject 1 mL (100 mg total) into the skin every 12 (twelve) hours for 7 days., Disp: 14 mL, Rfl: 0   metoprolol succinate (TOPROL-XL) 25 MG 24 hr tablet, TAKE 1 TABLET DAILY, Disp: 90 tablet, Rfl: 3   Multiple Vitamins-Minerals (MULTIVITAMIN WITH MINERALS) tablet, Take 1 tablet by mouth daily., Disp: , Rfl:    sildenafil (REVATIO) 20 MG tablet, Take 20 mg by mouth as needed., Disp: , Rfl:    nitroGLYCERIN (NITROSTAT) 0.4 MG SL tablet, Place 1 tablet (0.4 mg total) under the tongue every 5 (five) minutes as needed for chest pain. (Patient not taking: Reported on 02/08/2022), Disp: 30 tablet, Rfl: 2  Allergies: No Known Allergies  Past Medical History, Surgical history, Social history, and Family History were reviewed and updated.  Review of Systems: Review of Systems  Constitutional: Negative.   HENT:  Negative.    Eyes: Negative.   Respiratory: Negative.    Cardiovascular:  Positive for leg swelling.  Gastrointestinal: Negative.   Endocrine: Negative.   Genitourinary: Negative.    Musculoskeletal:  Positive for arthralgias and myalgias.  Skin: Negative.   Neurological: Negative.   Hematological: Negative.   Psychiatric/Behavioral: Negative.      Physical Exam:  weight is 237 lb 1.9 oz (107.6 kg). His oral temperature is 98.1 F (36.7 C). His blood pressure is 139/76 and his pulse is 75. His respiration is 17 and oxygen saturation is 95%.   Wt Readings from Last 3 Encounters:  04/05/22  237 lb 1.9 oz (107.6 kg)  02/08/22 232 lb 1.9 oz (105.3 kg)  01/20/22 227 lb 12.8 oz (103.3 kg)    Physical Exam Vitals reviewed.  HENT:     Head: Normocephalic and atraumatic.  Eyes:     Pupils: Pupils are equal, round, and reactive to light.  Cardiovascular:     Rate and Rhythm: Normal rate and regular rhythm.     Heart sounds: Normal heart sounds.  Pulmonary:     Effort: Pulmonary effort  is normal.     Breath sounds: Normal breath sounds.  Musculoskeletal:        General: No tenderness or deformity. Normal range of motion.     Cervical back: Normal range of motion.     Comments: Swelling in his right lower extremity. Mild tenderness in posterior knee.    Lymphadenopathy:     Cervical: No cervical adenopathy.  Skin:    General: Skin is warm and dry.     Findings: No erythema or rash.  Neurological:     Mental Status: He is alert and oriented to person, place, and time.  Psychiatric:        Behavior: Behavior normal.        Thought Content: Thought content normal.        Judgment: Judgment normal.      Lab Results  Component Value Date   WBC 5.3 04/05/2022   HGB 14.8 04/05/2022   HCT 43.9 04/05/2022   MCV 89.8 04/05/2022   PLT 123 (L) 04/05/2022     Chemistry      Component Value Date/Time   NA 141 04/05/2022 1413   NA 143 06/05/2013 0919   K 4.2 04/05/2022 1413   CL 104 04/05/2022 1413   CO2 30 04/05/2022 1413   BUN 26 (H) 04/05/2022 1413   BUN 29 (H) 06/05/2013 0919   CREATININE 1.42 (H) 04/05/2022 1413   CREATININE 1.92 (H) 04/28/2013 1027      Component Value Date/Time   CALCIUM 9.5 04/05/2022 1413   ALKPHOS 74 04/05/2022 1413   AST 35 04/05/2022 1413   ALT 31 04/05/2022 1413   BILITOT 0.7 04/05/2022 1413       Impression and Plan: Mr. Render returns for a follow up for history of pulmonary embolism and recurrent right lower extremity DVT.   Hypercoagulable workup from 12/21/2010 was negative.   I reviewed labs from today that requires no intervention. Stable and mild thrombocytopenia. Mild elevation in creatinine level, encouraged adequate PO hydration. Normal D-dimer levels.   He is currently on Pradaxa 150 mg since 01/19/2022. Unfortunately, doppler US from today shows new DVT within the right popliteal vein. Discussed alternative anticoagulation including Eliquis versus Coumadin therapy.   I will bridge patient with Lovenox 1 mg/kg  twice daily x 7 days and will request Dr.Ennever to finalize long-term anticoagulant therapy. Due to recurrent right lower extremity DVT, we will refer patient to vascular surgery to evaluate for May-Therners syndrome.   Follow up: RTC in 4 weeks with labs and follow up  Patient expressed understanding and satisfaction with the plan provided.   I have spent a total of 30 minutes minutes of face-to-face and non-face-to-face time, preparing to see the patient, performing a medically appropriate examination, counseling and educating the patient, ordering medications/tests/procedures, referring and communicating with other health care professionals, documenting clinical information in the electronic health record, and care coordination.   Dede Query PA-C Dept of Hematology and [place]

## 2022-04-07 ENCOUNTER — Encounter: Payer: Self-pay | Admitting: Hematology & Oncology

## 2022-04-07 ENCOUNTER — Telehealth: Payer: Self-pay | Admitting: *Deleted

## 2022-04-07 ENCOUNTER — Other Ambulatory Visit: Payer: Self-pay

## 2022-04-07 ENCOUNTER — Inpatient Hospital Stay (HOSPITAL_BASED_OUTPATIENT_CLINIC_OR_DEPARTMENT_OTHER): Payer: Medicare Other | Admitting: Hematology & Oncology

## 2022-04-07 VITALS — BP 146/74 | HR 71 | Temp 98.3°F | Resp 18 | Wt 237.0 lb

## 2022-04-07 DIAGNOSIS — I2782 Chronic pulmonary embolism: Secondary | ICD-10-CM

## 2022-04-07 DIAGNOSIS — I82451 Acute embolism and thrombosis of right peroneal vein: Secondary | ICD-10-CM | POA: Diagnosis not present

## 2022-04-07 DIAGNOSIS — I2609 Other pulmonary embolism with acute cor pulmonale: Secondary | ICD-10-CM

## 2022-04-07 MED ORDER — WARFARIN SODIUM 5 MG PO TABS: 5.0000 mg | ORAL_TABLET | Freq: Every day | ORAL | 4 refills | Status: DC

## 2022-04-07 NOTE — Patient Instructions (Signed)
Vitamin K Foods and Warfarin Warfarin is a blood thinner (anticoagulant). Anticoagulant medicines help prevent blood clots from forming or getting bigger. Warfarin works by blocking the activity of vitamin K. Vitamin K promotes normal blood clotting. When you take warfarin, problems can occur from suddenly increasing or decreasing the amount of vitamin K that you eat from one day to the next. These problems can occur due to varying levels of warfarin in your blood. Problems may include blood clots or bleeding. What are tips for eating the right amount of vitamin K? Reading food labels Know which foods contain vitamin K. Read food labels. Use the lists below to understand serving sizes and the amount of vitamin K in one serving. If you take a multivitamin that contains vitamin K, be sure to take it every day. Meal planning To avoid problems when taking warfarin: Eat a balanced diet that includes: Fresh fruits and vegetables. Whole grains. Low-fat dairy products. Lean proteins, such as fish, eggs, and lean cuts of meat. Avoid major changes in your diet. If you are going to change your diet, talk with your health care provider before making changes. Keep your intake of vitamin K consistent from day to day. Avoid eating large amounts of vitamin K one day and small amounts of vitamin K the next day. Work with a dietitian to develop a meal plan that works best for you.  What foods are high in vitamin K? Foods that are high in vitamin K contain more than 100 mcg (micrograms) per serving. These include: Broccoli (cooked from fresh) -  cup (78 g) has 110 mcg. Brussels sprouts (cooked from fresh) -  cup (78 g) has 109 mcg. Greens, beet (cooked from fresh) -  cup (72 g) has 350 mcg. Greens, collard (cooked from fresh) -  cup (66 g) has 263 mcg. Greens, turnip (cooked from fresh) -  cup (72 g) has 265 mcg. Green onions or scallions -  cup (50 g) has 105 mcg. Kale (cooked from fresh) -  cup (68  g) has 536 mcg. Parsley (raw) - 10 sprigs (10 g) has 164 mcg. Spinach (cooked from fresh) -  cup (90 g) has 444 mcg. Swiss chard (cooked from fresh) -  cup (88 g) has 287 mcg. The items listed above may not be a complete list of foods high in Vitamin K. Actual amounts of Vitamin K may differ depending on processing. Contact a dietitian for more information. What foods have a moderate amount of vitamin K? Foods that have a moderate amount of vitamin K contain 25-100 mcg per serving. These include: Asparagus (cooked from fresh) - 4 spears (60 g) have 30 mcg. Black-eyed peas (dried) -  cup (85 g) has 32 mcg. Cabbage (cooked from fresh) -  cup (78 g) has 84 mcg. Cabbage (raw) -  cup (35 g) has 26 mcg. Kiwi fruit - 1 medium (69 g) has 27 mcg. Lettuce (raw) - 1 cup (36 g) has 45 mcg. Okra (cooked from fresh) -  cup (80 g) has 32 mcg. Prunes (dried) - 5 prunes (47 g) have 25 mcg. Tuna, light, canned in oil - 3 oz (85 g) has 37 mcg. Watercress (raw) - 1 cup (34 g) has 85 mcg. The items listed above may not be a complete list of foods with a moderate amount of Vitamin K. Actual amounts of Vitamin K may differ depending on processing. Contact a dietitian for more information. What foods are low in vitamin K? Foods low in   vitamin K contain less than 25 mcg per serving. These include: Artichoke - 1 medium (128 g) has 18 mcg. Avocado - 1 oz (21 g) has 6 mcg. Blueberries -  cup (73 g) has 14 mcg. Carrots (cooked from fresh) -  cup (78 g) has 11 mcg. Cauliflower (raw) -  cup (54 g) has 8 mcg. Cucumber with peel (raw) -  cup (52 g) has 9 mcg. Grapes -  cup (76 g) has 12 mcg. Mango - 1 medium (207 g) has 9 mcg. Mixed nuts - 1 cup (142 g) has 17 mcg. Pear - 1 medium (178 g) has 8 mcg. Peas (cooked from fresh) -  cup (80 g) has 20 mcg. Pickled cucumber - 1 spear (65 g) has 11 mcg. Sauerkraut (canned) -  cup (118 g) has 16 mcg. Soybeans (cooked from fresh) -  cup (86 g) has 16 mcg. Tomato  (raw) - 1 medium (123 g) has 10 mcg. Tomato sauce (raw) -  cup (123 g) has 17 mcg. The items listed above may not be a complete list of foods low in Vitamin K. Actual amounts of Vitamin K may differ depending on processing. Contact a dietitian for more information. What foods do not have vitamin K? If a food contains less than 5 mcg per serving, it is considered to have no vitamin K. These foods include: Bread and cereal products. Cheese. Eggs. Fish and shellfish. Meat and poultry. Milk and dairy products. Seeds, such as sunflower or pumpkin seeds. The items listed above may not be a complete list of foods that do not have vitamin K. Actual amounts of vitamin K may differ depending on processing. Contact a dietitian for more information. Summary Warfarin is an anticoagulant that prevents blood clots from forming or getting bigger by blocking the activity of vitamin K. It is important to know the amount of vitamin K that is in the foods you eat and to keep your intake of vitamin K consistent from day to day. Avoid major changes in your diet. If you are going to change your diet, talk with your health care provider before making changes. This information is not intended to replace advice given to you by your health care provider. Make sure you discuss any questions you have with your health care provider. Document Revised: 03/24/2020 Document Reviewed: 03/24/2020 Elsevier Patient Education  [place]. Warfarin Information Warfarin is a blood thinner (anticoagulant). Anticoagulants help to prevent the formation of blood clots or keep them from getting bigger. Your health care provider will monitor the anticoagulation effect of warfarin closely and will adjust your medicine as needed. Who should use warfarin? Warfarin is prescribed for people who have blood clots, or who are at risk for developing harmful blood clots, such as people who: Have mechanical heart valves. Have irregular  heart rhythms (atrial fibrillation). Have certain clotting disorders. Have had blood clots in the past or are currently receiving treatment for them. This includes people who have had a stroke, blood clots in the lungs (pulmonary embolism, or PE), or blood clots in the legs (deep vein thrombosis,or DVT). How is warfarin taken? Warfarin is taken by mouth (orally). Warfarin tablets come in different strengths. The strength is printed on the tablet, and each strength is a different color. If you get a new prescription and the color of your tablet is different than usual, tell your pharmacist or health care provider immediately. Take warfarin exactly as told by your health care provider, at the same  time every day. Doing this helps you avoid bleeding or blood clots that could result in serious injury, pain, or disability. Contact your health care provider if a dose is forgotten or missed. Do not change or take additional dosesto make up for missed or accidental extra doses. What blood tests do I need while taking warfarin?  Warfarin is a medicine that needs to be closely monitored with blood tests. It is very important to keep all lab visits and follow-up visits with your health care provider. These tests measure the blood's ability to clot and are called prothrombin tests (PT)or international normalized ratio (INR) tests. These tests can be done with a finger stick or a blood draw. What does the INR test result mean? The PT test results will be reported as the INR. Your health care provider will tell you your target INR range. If your INR is not in your target range, your health care provider may adjust your dosage. If your INR is above your target range, there is a risk of bleeding. Your dosage of warfarin may need to be decreased. If your INR is below your target range, there is a risk of clotting. Your dosage of warfarin may need to be increased. How often is the INR test needed? When you first start  warfarin, you will usually have your INR checked every few days until the health care provider determines the correct dosage of warfarin. After you have reached your target INR, your INR will be tested less often. However, you will need to have your INR checked at least once every 4-6 weeks while you take warfarin. Some people may be able to use home monitoring to check their INR. Ask your health care provider if this applies to you. What are the side effects of warfarin? Too much warfarin can cause bleeding or hemorrhage in any part of the body, such as: Bleeding from the gums. Unexplained bruises or bruises that get larger. A nosebleed that is not easily stopped. Bleeding in the brain (hemorrhagic stroke). Coughing up or vomiting blood. Blood in the urine or stools. Warfarin may also cause: Skin rash or irritations. Nausea that does not go away. Severe pain in the back or joints. Painful toes that turn blue or purple (purple toe syndrome). Painful ulcers that do not go away (skin necrosis). What precautions do I need to take while using warfarin? Wear a medical alert bracelet or carry a card that lists what medicines you take. Make sure that all health care providers, including your dentist, know you are taking warfarin. Avoid situations that cause bleeding by: Using a softer toothbrush. Flossing with waxed floss. Shaving with an Copy, not with a blade. Limiting your use of sharp objects. Avoiding activities that put you at risk for injury, such as contact sports. What do I need to know about warfarin and pregnancy or breastfeeding? If you are taking warfarin and you become pregnant, or plan to become pregnant, contact your health care provider right away. Though warfarin has been associated with birth defects, it can be used in some cases after weighing risks to mother and baby. If you plan to breastfeed while taking warfarin, talk with your health care provider first. What  do I need to know about warfarin and alcohol or drug use? Do not drink alcohol if: Your health care provider tells you not to drink. You are pregnant, may be pregnant, or are planning to become pregnant. If you drink alcohol: Limit how much you have  to: 0-1 drink a day for women. 0-2 drinks a day for men. Know how much alcohol is in your drink. In the [place] one drink equals one 12 oz bottle of beer (355 mL), one 5 oz glass of wine (148 mL), or one 1 oz glass of hard liquor (44 mL). If you change the amount of alcohol that you drink, tell your health care provider. Your warfarin dosage may need to be changed. Do not use any products that contain nicotine or tobacco. These products include cigarettes, chewing tobacco, and vaping devices, such as e-cigarettes. If you need help quitting, ask your health care provider. If you use nicotine or tobacco products and change the amount that you use, tell your health care provider. Your warfarin dosage may need to be changed. Avoid drug use while taking warfarin. The effects of drugs on warfarin are not known. What do I need to know about warfarin and other medicines or supplements? Many prescription and over-the-counter medicines can interfere with warfarin. Talk with your health care provider or your pharmacist before starting or stopping any new medicines. This includes vitamins, herbs, supplements, and pain medicines. Some common over-the-counter medicines that may increase the risk of dangerous bleeding while taking warfarin include: Aspirin. NSAIDs, such as ibuprofen or naproxen. Vitamin E. Fish oils. What do I need to know about warfarin and my diet? Vitamin K decreases the effect of warfarin, and it is found in many foods. Eat a consistent amount of foods that contain vitamin K. For example, you may decide to eat 2 servings of vitamin K-containing foods each day. It is important to maintain a normal, balanced diet while taking warfarin. Avoid  major changes in your diet. If you are going to change your diet, talk with your health care provider before making changes. Your health care provider may recommend that you work with a dietitian. Contact a health care provider if you: Miss a dose. Take an extra dose. Plan to have any kind of surgery or procedure. Ask whether you should stop taking warfarin or change your dose before your surgery. Are unable to take your medicine due to nausea, vomiting, or diarrhea. Have any major changes in your diet, or you plan to make major changes in your diet. Start or stop any over-the-counter medicine, prescription medicine, herbal supplement, or dietary supplement. Become pregnant, plan to become pregnant, or think you may be pregnant. Have menstrual periods that are heavier than usual. Have unusual bruising. Get help right away if you: Have signs of an allergic reaction, such as: Swelling of the lips, face, tongue, mouth, or throat. Rash or itchy, red, swollen areas of skin (hives). Trouble breathing. Chest tightness. Fall or have an accident, especially if you hit your head. Have signs that your blood is too thin, such as: Blood in your urine. Your urine may look reddish, pinkish, or tea-colored. Blood in your stool. Your stool may be black or bright red. Coughing up or vomiting blood. The blood may be bright red, or it may look like coffee grounds. Bleeding that does not stop after applying pressure to the area for 30 minutes. Have signs of a blood clot in your leg or arm, such as: Pain or swelling in your leg or arm. Skin that is red or warm to the touch on your arm or leg. Have signs of blood in your lung, such as: Shortness of breath or difficulty breathing. Chest pain. Unexplained fever. Have any symptoms of a stroke. "BE FAST" is an  easy way to remember the main warning signs of a stroke: B - Balance. Signs are dizziness, sudden trouble walking, or loss of balance. E - Eyes. Signs  are trouble seeing or a sudden change in vision. F - Face. Signs are sudden weakness or numbness of the face, or the face or eyelid drooping on one side. A - Arms. Signs are weakness or numbness in an arm. This happens suddenly and usually on one side of the body. S - Speech. Signs are sudden trouble speaking, slurred speech, or trouble understanding what people say. T - Time. Time to call emergency services. Write down what time symptoms started. Have other signs of a stroke, such as: A sudden, severe headache with no known cause. Nausea or vomiting. Seizure. Have other signs of a reaction to warfarin, such as: Purple or blue toes. Skin ulcers that do not go away. These symptoms may represent a serious problem that is an emergency. Do not wait to see if the symptoms will go away. Get medical help right away. Call your local emergency services (911 in the [place] Do not drive yourself to the hospital. Summary Warfarin is a medicine that thins blood. It is used to prevent or treat blood clots. You must be monitored closely while on this medicine. Keep all follow-up visits. Make sure that you know your target INR range and your warfarin dosage. Wear or carry identification that says you are taking warfarin. Take warfarin at the same time every day. Call your health care provider if you miss a dose or if you take an extra dose. Do not change the dosage of warfarin on your own. Know the signs and symptoms of blood clots, bleeding, and a stroke. Know when to get emergency medical help. This information is not intended to replace advice given to you by your health care provider. Make sure you discuss any questions you have with your health care provider. Document Revised: 04/07/2020 Document Reviewed: 04/07/2020 Elsevier Patient Education  [place].

## 2022-04-07 NOTE — Progress Notes (Signed)
Hematology and Oncology Follow Up Visit  Glenn Wheeler [id number] 1955-03-12 67 y.o. 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 twice daily -- start on 01/20/2022 -- d/c on 03/28/2022 Coumadin 5 mg po q day -- start on 04/07/2022 -- INR 2.5-3.5     Interim History:  Glenn Wheeler is back for follow-up.  Unfortunately, he has a new DVT.  This was found about a week or so ago.  He was having some pain and swelling in the right leg.  He subsequently had a Doppler done which showed a new thrombus in the popliteal vein.  Has been on Pradaxa.  He has been on this for about 2 months.  Unfortunately, we had to make a change on him.  He is currently on Lovenox.  He really would prefer to be off Lovenox.  I think the next option would be Coumadin.  I think this would be reasonable.  I do not see a reason why we cannot use Coumadin.  He has had no chest wall pain.  He has had no cough.  He has had no nausea or vomiting.  He has had no bleeding.  There is been no change in bowel or bladder habits.  There has been no problems with COVID.  Overall, I would say his performance status is probably ECOG 0.  .  Medications:  Current Outpatient Medications:    acetaminophen (TYLENOL) 325 MG tablet, Take by mouth., Disp: , Rfl:    atorvastatin (LIPITOR) 40 MG tablet, Take 1 tablet (40 mg total) by mouth at bedtime., Disp: 90 tablet, Rfl: 3   enoxaparin (LOVENOX) 100 MG/ML injection, Inject 1 mL (100 mg total) into the skin every 12 (twelve) hours for 7 days., Disp: 14 mL, Rfl: 0   metoprolol succinate (TOPROL-XL) 25 MG 24 hr tablet, TAKE 1 TABLET DAILY, Disp: 90 tablet, Rfl: 3   Multiple Vitamins-Minerals (MULTIVITAMIN WITH MINERALS) tablet, Take 1 tablet by mouth daily., Disp: , Rfl:    nitroGLYCERIN (NITROSTAT) 0.4 MG SL tablet, Place 1 tablet (0.4 mg total) under the tongue every 5 (five) minutes as needed for  chest pain. (Patient not taking: Reported on 02/08/2022), Disp: 30 tablet, Rfl: 2   sildenafil (REVATIO) 20 MG tablet, Take 20 mg by mouth as needed., Disp: , Rfl:   Allergies: No Known Allergies  Past Medical History, Surgical history, Social history, and Family History were reviewed and updated.  Review of Systems: Review of Systems  Constitutional: Negative.   HENT:  Negative.    Eyes: Negative.   Respiratory: Negative.    Cardiovascular:  Positive for leg swelling.  Gastrointestinal: Negative.   Endocrine: Negative.   Genitourinary: Negative.    Musculoskeletal:  Positive for arthralgias and myalgias.  Skin: Negative.   Neurological: Negative.   Hematological: Negative.   Psychiatric/Behavioral: Negative.      Physical Exam:  weight is 237 lb (107.5 kg). His oral temperature is 98.3 F (36.8 C). His blood pressure is 146/74 (abnormal) and his pulse is 71. His respiration is 18.   Wt Readings from Last 3 Encounters:  04/07/22 237 lb (107.5 kg)  04/05/22 237 lb 1.9 oz (107.6 kg)  02/08/22 232 lb 1.9 oz (105.3 kg)    Physical Exam Vitals reviewed.  HENT:     Head: Normocephalic and atraumatic.  Eyes:     Pupils: Pupils are equal, round, and reactive to light.  Cardiovascular:     Rate and Rhythm: Normal rate and regular rhythm.     Heart sounds: Normal heart sounds.  Pulmonary:     Effort: Pulmonary effort is normal.     Breath sounds: Normal breath sounds.  Abdominal:     General: Bowel sounds are normal.     Palpations: Abdomen is soft.  Musculoskeletal:        General: No tenderness or deformity. Normal range of motion.     Cervical back: Normal range of motion.     Comments: With his extremities, he does have some swelling in the right lower extremity.  He has a negative Homans sign.  I cannot palpate any obvious venous cord.  I cannot palpate any obvious Baker's cyst.  Maybe a little bit of swelling in the right thigh.  His left leg is unremarkable.  He has  good pulses in his distal extremities bilaterally.  Lymphadenopathy:     Cervical: No cervical adenopathy.  Skin:    General: Skin is warm and dry.     Findings: No erythema or rash.  Neurological:     Mental Status: He is alert and oriented to person, place, and time.  Psychiatric:        Behavior: Behavior normal.        Thought Content: Thought content normal.        Judgment: Judgment normal.     Lab Results  Component Value Date   WBC 5.3 04/05/2022   HGB 14.8 04/05/2022   HCT 43.9 04/05/2022   MCV 89.8 04/05/2022   PLT 123 (L) 04/05/2022     Chemistry      Component Value Date/Time   NA 141 04/05/2022 1413   NA 143 06/05/2013 0919   K 4.2 04/05/2022 1413   CL 104 04/05/2022 1413   CO2 30 04/05/2022 1413   BUN 26 (H) 04/05/2022 1413   BUN 29 (H) 06/05/2013 0919   CREATININE 1.42 (H) 04/05/2022 1413   CREATININE 1.92 (H) 04/28/2013 1027      Component Value Date/Time   CALCIUM 9.5 04/05/2022 1413   ALKPHOS 74 04/05/2022 1413   AST 35 04/05/2022 1413   ALT 31 04/05/2022 1413   BILITOT 0.7 04/05/2022 1413       Impression and Plan: Glenn Wheeler is a very nice 67 year old white male.  He has a history of recurrent thromboembolism.  He had been on Xarelto for quite a while.  We then had him on aspirin.  He had not seen [place] for several years.  He then presented with another thrombus in the right posterior tibial vein and popliteal vein.  He was placed on Pradaxa.  Unfortunate, he now has another thrombus.  He is will be on Coumadin.  He will be on Coumadin for life.  I just really hate that he needs to be on lifelong antibiotic coagulation.  Of note, he had a normal D-dimer back on 04/05/2022 of less than 0.27.  Again wanted to have his INR between 2.5-3.5.  I think this would be a good level for him.  We will start him off on 5 mg a day of Coumadin.  We will have his INR checked in about 5 days.  I will plan to see him back myself in about 3-4 weeks.   Volanda Napoleon, MD 3/8/20241:29 PM

## 2022-04-07 NOTE — Telephone Encounter (Signed)
Referral has been faxed to [facility] to Dr. Wenda Overland @ [phone]

## 2022-04-11 ENCOUNTER — Inpatient Hospital Stay: Payer: Medicare Other

## 2022-04-12 ENCOUNTER — Other Ambulatory Visit: Payer: Self-pay | Admitting: *Deleted

## 2022-04-12 ENCOUNTER — Telehealth: Payer: Self-pay | Admitting: *Deleted

## 2022-04-12 ENCOUNTER — Inpatient Hospital Stay: Payer: Medicare Other

## 2022-04-12 DIAGNOSIS — I82409 Acute embolism and thrombosis of unspecified deep veins of unspecified lower extremity: Secondary | ICD-10-CM

## 2022-04-12 DIAGNOSIS — I2601 Septic pulmonary embolism with acute cor pulmonale: Secondary | ICD-10-CM

## 2022-04-12 DIAGNOSIS — I824Z1 Acute embolism and thrombosis of unspecified deep veins of right distal lower extremity: Secondary | ICD-10-CM

## 2022-04-12 DIAGNOSIS — I82451 Acute embolism and thrombosis of right peroneal vein: Secondary | ICD-10-CM | POA: Diagnosis not present

## 2022-04-12 DIAGNOSIS — I2609 Other pulmonary embolism with acute cor pulmonale: Secondary | ICD-10-CM

## 2022-04-12 LAB — PROTIME-INR
INR: 1.1 (ref 0.8–1.2)
Prothrombin Time: 14.6 seconds (ref 11.4–15.2)

## 2022-04-12 MED ORDER — ENOXAPARIN SODIUM 100 MG/ML IJ SOSY: 100.0000 mg | PREFILLED_SYRINGE | Freq: Two times a day (BID) | INTRAMUSCULAR | 0 refills | Status: DC

## 2022-04-12 NOTE — Telephone Encounter (Signed)
Call placed to patient and patient notified per order of Dr. Marin Olp to increase Coumadin dose to 10 mg daily, to continue Lovenox 100 mg BID and to repeat PT/INR in one week.  Teach back done. Pt is appreciative of call and has no questions or concerns at this time.  Lab appt made for next week and pt aware of date and time.

## 2022-04-13 ENCOUNTER — Other Ambulatory Visit: Payer: Self-pay | Admitting: *Deleted

## 2022-04-13 ENCOUNTER — Encounter: Payer: Self-pay | Admitting: Hematology & Oncology

## 2022-04-13 MED ORDER — WARFARIN SODIUM 10 MG PO TABS: 10.0000 mg | ORAL_TABLET | Freq: Every day | ORAL | 2 refills | Status: DC

## 2022-04-19 ENCOUNTER — Inpatient Hospital Stay: Payer: Medicare Other

## 2022-04-19 ENCOUNTER — Other Ambulatory Visit: Payer: Self-pay

## 2022-04-19 ENCOUNTER — Telehealth: Payer: Self-pay

## 2022-04-19 DIAGNOSIS — I82409 Acute embolism and thrombosis of unspecified deep veins of unspecified lower extremity: Secondary | ICD-10-CM

## 2022-04-19 DIAGNOSIS — I824Z1 Acute embolism and thrombosis of unspecified deep veins of right distal lower extremity: Secondary | ICD-10-CM

## 2022-04-19 DIAGNOSIS — I2601 Septic pulmonary embolism with acute cor pulmonale: Secondary | ICD-10-CM

## 2022-04-19 DIAGNOSIS — I2609 Other pulmonary embolism with acute cor pulmonale: Secondary | ICD-10-CM

## 2022-04-19 DIAGNOSIS — I82451 Acute embolism and thrombosis of right peroneal vein: Secondary | ICD-10-CM | POA: Diagnosis not present

## 2022-04-19 LAB — PROTIME-INR
INR: 2.1 — ABNORMAL HIGH (ref 0.8–1.2)
Prothrombin Time: 23.7 seconds — ABNORMAL HIGH (ref 11.4–15.2)

## 2022-04-19 NOTE — Telephone Encounter (Signed)
Received phone call from patient inquiring about his lab results from this AM. Reviewed lab results with DR. Ennever and orders received. Pt to stop lovenox and continue on Coumadin 10 mg daily and to have a repeat PT/INR in one week. Pt stated he was traveling and appointment made for labs upon his return from travel. Pt verbalized understanding and had no further questions.

## 2022-05-01 ENCOUNTER — Telehealth: Payer: Self-pay

## 2022-05-01 ENCOUNTER — Inpatient Hospital Stay: Payer: Medicare Other | Attending: Hematology & Oncology

## 2022-05-01 DIAGNOSIS — Z7901 Long term (current) use of anticoagulants: Secondary | ICD-10-CM | POA: Insufficient documentation

## 2022-05-01 DIAGNOSIS — Z86718 Personal history of other venous thrombosis and embolism: Secondary | ICD-10-CM | POA: Diagnosis present

## 2022-05-01 DIAGNOSIS — I824Z1 Acute embolism and thrombosis of unspecified deep veins of right distal lower extremity: Secondary | ICD-10-CM

## 2022-05-01 LAB — PROTIME-INR
INR: 3 — ABNORMAL HIGH (ref 0.8–1.2)
Prothrombin Time: 31 seconds — ABNORMAL HIGH (ref 11.4–15.2)

## 2022-05-01 NOTE — Telephone Encounter (Signed)
-----   Message from Volanda Napoleon, MD sent at 05/01/2022 10:50 AM EDT ----- Tell him that the INR is perfect.  How much Coumadin is he taking?  We need to repeat the INR in 1 week.  Thanks.  Laurey Arrow

## 2022-05-05 ENCOUNTER — Inpatient Hospital Stay (HOSPITAL_BASED_OUTPATIENT_CLINIC_OR_DEPARTMENT_OTHER): Payer: Medicare Other | Admitting: Hematology & Oncology

## 2022-05-05 ENCOUNTER — Inpatient Hospital Stay: Payer: Medicare Other

## 2022-05-05 ENCOUNTER — Other Ambulatory Visit: Payer: Self-pay

## 2022-05-05 ENCOUNTER — Encounter: Payer: Self-pay | Admitting: Hematology & Oncology

## 2022-05-05 VITALS — BP 114/65 | HR 70 | Temp 98.0°F | Resp 18 | Ht 72.05 in | Wt 235.0 lb

## 2022-05-05 DIAGNOSIS — I2609 Other pulmonary embolism with acute cor pulmonale: Secondary | ICD-10-CM | POA: Diagnosis not present

## 2022-05-05 DIAGNOSIS — Z86718 Personal history of other venous thrombosis and embolism: Secondary | ICD-10-CM | POA: Diagnosis not present

## 2022-05-05 LAB — CBC WITH DIFFERENTIAL (CANCER CENTER ONLY)
Abs Immature Granulocytes: 0.01 10*3/uL (ref 0.00–0.07)
Basophils Absolute: 0.1 10*3/uL (ref 0.0–0.1)
Basophils Relative: 1 %
Eosinophils Absolute: 0.1 10*3/uL (ref 0.0–0.5)
Eosinophils Relative: 2 %
HCT: 44 % (ref 39.0–52.0)
Hemoglobin: 14.9 g/dL (ref 13.0–17.0)
Immature Granulocytes: 0 %
Lymphocytes Relative: 22 %
Lymphs Abs: 1.5 10*3/uL (ref 0.7–4.0)
MCH: 29.9 pg (ref 26.0–34.0)
MCHC: 33.9 g/dL (ref 30.0–36.0)
MCV: 88.2 fL (ref 80.0–100.0)
Monocytes Absolute: 0.5 10*3/uL (ref 0.1–1.0)
Monocytes Relative: 8 %
Neutro Abs: 4.7 10*3/uL (ref 1.7–7.7)
Neutrophils Relative %: 67 %
Platelet Count: 131 10*3/uL — ABNORMAL LOW (ref 150–400)
RBC: 4.99 MIL/uL (ref 4.22–5.81)
RDW: 13.5 % (ref 11.5–15.5)
WBC Count: 6.9 10*3/uL (ref 4.0–10.5)
nRBC: 0 % (ref 0.0–0.2)

## 2022-05-05 LAB — CMP (CANCER CENTER ONLY)
ALT: 35 U/L (ref 0–44)
AST: 33 U/L (ref 15–41)
Albumin: 3.8 g/dL (ref 3.5–5.0)
Alkaline Phosphatase: 84 U/L (ref 38–126)
Anion gap: 9 (ref 5–15)
BUN: 24 mg/dL — ABNORMAL HIGH (ref 8–23)
CO2: 27 mmol/L (ref 22–32)
Calcium: 8.8 mg/dL — ABNORMAL LOW (ref 8.9–10.3)
Chloride: 107 mmol/L (ref 98–111)
Creatinine: 1.29 mg/dL — ABNORMAL HIGH (ref 0.61–1.24)
GFR, Estimated: >60 mL/min (ref >60)
Glucose, Bld: 173 mg/dL — ABNORMAL HIGH (ref 70–99)
Potassium: 4.7 mmol/L (ref 3.5–5.1)
Sodium: 143 mmol/L (ref 135–145)
Total Bilirubin: 0.5 mg/dL (ref 0.3–1.2)
Total Protein: 6.7 g/dL (ref 6.5–8.1)

## 2022-05-05 LAB — PROTIME-INR
INR: 3.3 — ABNORMAL HIGH (ref 0.8–1.2)
Prothrombin Time: 33.1 seconds — ABNORMAL HIGH (ref 11.4–15.2)

## 2022-05-05 NOTE — Progress Notes (Signed)
Hematology and Oncology Follow Up Visit  Glenn Wheeler [id number] 1955/07/14 67 y.o. 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 twice daily -- start on 01/20/2022 -- d/c on 03/28/2022 Coumadin 10 mg po q day -- start on 04/07/2022 -- INR 2.5-3.5     Interim History:  Glenn Wheeler is back for follow-up.  He is doing pretty well with the Coumadin now.  He was on Lovenox.  His INR today is 3.3.  He is on Coumadin at 10 mg a day.  There is no problems with bleeding.  He has had no problems with cough or shortness of breath.  He has had no nausea or vomiting.  He has had no change in bowel or bladder habits.  His right leg still has little bit of swelling.  This is more chronic.  He has had no problems with fever.  His daughter just got back from [place] with her family.  They are now living with he and his wife.  He had a wonderful Easter.  They hosted [employer] services at their house.  Currently, I would say that his performance status is probably ECOG 1.  .  .  Medications:  Current Outpatient Medications:    acetaminophen (TYLENOL) 325 MG tablet, Take by mouth., Disp: , Rfl:    atorvastatin (LIPITOR) 40 MG tablet, Take 1 tablet (40 mg total) by mouth at bedtime., Disp: 90 tablet, Rfl: 3   metoprolol succinate (TOPROL-XL) 25 MG 24 hr tablet, TAKE 1 TABLET DAILY, Disp: 90 tablet, Rfl: 3   Multiple Vitamins-Minerals (MULTIVITAMIN WITH MINERALS) tablet, Take 1 tablet by mouth daily., Disp: , Rfl:    sildenafil (REVATIO) 20 MG tablet, Take 20 mg by mouth as needed., Disp: , Rfl:    warfarin (COUMADIN) 10 MG tablet, Take 1 tablet (10 mg total) by mouth daily., Disp: 30 tablet, Rfl: 2   warfarin (COUMADIN) 5 MG tablet, Take 1 tablet (5 mg total) by mouth daily., Disp: 30 tablet, Rfl: 4   enoxaparin (LOVENOX) 100 MG/ML injection, Inject 1 mL (100 mg total) into the skin every 12 (twelve) hours for 7  days., Disp: 14 mL, Rfl: 0   nitroGLYCERIN (NITROSTAT) 0.4 MG SL tablet, Place 1 tablet (0.4 mg total) under the tongue every 5 (five) minutes as needed for chest pain. (Patient not taking: Reported on 02/08/2022), Disp: 30 tablet, Rfl: 2  Allergies: No Known Allergies  Past Medical History, Surgical history, Social history, and Family History were reviewed and updated.  Review of Systems: Review of Systems  Constitutional: Negative.   HENT:  Negative.    Eyes: Negative.   Respiratory: Negative.    Cardiovascular:  Positive for leg swelling.  Gastrointestinal: Negative.   Endocrine: Negative.   Genitourinary: Negative.    Musculoskeletal:  Positive for arthralgias and myalgias.  Skin: Negative.   Neurological: Negative.   Hematological: Negative.   Psychiatric/Behavioral: Negative.      Physical Exam:  height is 6' 0.05" (1.83 m) and weight is 235 lb 0.6 oz (106.6 kg). His oral temperature is 98 F (36.7 C). His blood pressure is 114/65 and his pulse is 70. His respiration is 18 and oxygen saturation is 98%.   Wt Readings from Last 3 Encounters:  05/05/22 235 lb 0.6 oz (106.6 kg)  04/07/22 237 lb (107.5 kg)  04/05/22 237 lb 1.9 oz (107.6 kg)    Physical Exam Vitals reviewed.  HENT:     Head: Normocephalic and atraumatic.  Eyes:     Pupils: Pupils are equal, round, and reactive to light.  Cardiovascular:     Rate and Rhythm: Normal rate and regular rhythm.     Heart sounds: Normal heart sounds.  Pulmonary:     Effort: Pulmonary effort is normal.     Breath sounds: Normal breath sounds.  Abdominal:     General: Bowel sounds are normal.     Palpations: Abdomen is soft.  Musculoskeletal:        General: No tenderness or deformity. Normal range of motion.     Cervical back: Normal range of motion.     Comments: With his extremities, he does have some swelling in the right lower extremity.  He has a negative Homans sign.  I cannot palpate any obvious venous cord.  I  cannot palpate any obvious Baker's cyst.  Maybe a little bit of swelling in the right thigh.  His left leg is unremarkable.  He has good pulses in his distal extremities bilaterally.  Lymphadenopathy:     Cervical: No cervical adenopathy.  Skin:    General: Skin is warm and dry.     Findings: No erythema or rash.  Neurological:     Mental Status: He is alert and oriented to person, place, and time.  Psychiatric:        Behavior: Behavior normal.        Thought Content: Thought content normal.        Judgment: Judgment normal.      Lab Results  Component Value Date   WBC 6.9 05/05/2022   HGB 14.9 05/05/2022   HCT 44.0 05/05/2022   MCV 88.2 05/05/2022   PLT 131 (L) 05/05/2022     Chemistry      Component Value Date/Time   NA 143 05/05/2022 0931   NA 143 06/05/2013 0919   K 4.7 05/05/2022 0931   CL 107 05/05/2022 0931   CO2 27 05/05/2022 0931   BUN 24 (H) 05/05/2022 0931   BUN 29 (H) 06/05/2013 0919   CREATININE 1.29 (H) 05/05/2022 0931   CREATININE 1.92 (H) 04/28/2013 1027      Component Value Date/Time   CALCIUM 8.8 (L) 05/05/2022 0931   ALKPHOS 84 05/05/2022 0931   AST 33 05/05/2022 0931   ALT 35 05/05/2022 0931   BILITOT 0.5 05/05/2022 0931       Impression and Plan: Glenn Wheeler is a very nice 67 year old white male.  He has a history of recurrent thromboembolism.  He had been on Xarelto for quite a while.  We then had him on aspirin.  He had not seen [place] for several years.  He then presented with another thrombus in the right posterior tibial vein and popliteal vein.  He was placed on Pradaxa.  Unfortunately, he developed  another thrombus.  His INR is quite good right now.  Again I want to maintain his INR between 2.5-3.5.  We will have to follow-up with his Coumadin in about a couple weeks.  When we see him back, we will do another Doppler to see how this thrombus in the right leg is doing.  I am just happy that he is doing well on the Coumadin.  He is on a  stable dose right now.  I will plan to see him back probably in about 6 weeks.   [name], MD 4/5/202410:33 AM

## 2022-05-09 ENCOUNTER — Emergency Department (HOSPITAL_BASED_OUTPATIENT_CLINIC_OR_DEPARTMENT_OTHER): Payer: Medicare Other

## 2022-05-09 ENCOUNTER — Inpatient Hospital Stay (HOSPITAL_BASED_OUTPATIENT_CLINIC_OR_DEPARTMENT_OTHER)
Admission: EM | Admit: 2022-05-09 | Discharge: 2022-05-16 | DRG: 397 | Disposition: A | Discharge status: Home / Self Care | Payer: Medicare Other | Attending: General Surgery | Admitting: General Surgery

## 2022-05-09 ENCOUNTER — Encounter (HOSPITAL_BASED_OUTPATIENT_CLINIC_OR_DEPARTMENT_OTHER): Payer: Self-pay | Admitting: Urology

## 2022-05-09 ENCOUNTER — Other Ambulatory Visit: Payer: Self-pay

## 2022-05-09 DIAGNOSIS — I2510 Atherosclerotic heart disease of native coronary artery without angina pectoris: Secondary | ICD-10-CM | POA: Diagnosis present

## 2022-05-09 DIAGNOSIS — D62 Acute posthemorrhagic anemia: Secondary | ICD-10-CM | POA: Diagnosis not present

## 2022-05-09 DIAGNOSIS — K3533 Acute appendicitis with perforation and localized peritonitis, with abscess: Secondary | ICD-10-CM | POA: Diagnosis not present

## 2022-05-09 DIAGNOSIS — Z833 Family history of diabetes mellitus: Secondary | ICD-10-CM

## 2022-05-09 DIAGNOSIS — Z8249 Family history of ischemic heart disease and other diseases of the circulatory system: Secondary | ICD-10-CM

## 2022-05-09 DIAGNOSIS — Z7901 Long term (current) use of anticoagulants: Secondary | ICD-10-CM

## 2022-05-09 DIAGNOSIS — K3580 Unspecified acute appendicitis: Secondary | ICD-10-CM | POA: Diagnosis not present

## 2022-05-09 DIAGNOSIS — Z86718 Personal history of other venous thrombosis and embolism: Secondary | ICD-10-CM

## 2022-05-09 DIAGNOSIS — M7989 Other specified soft tissue disorders: Secondary | ICD-10-CM | POA: Diagnosis present

## 2022-05-09 DIAGNOSIS — I739 Peripheral vascular disease, unspecified: Secondary | ICD-10-CM | POA: Diagnosis present

## 2022-05-09 DIAGNOSIS — K649 Unspecified hemorrhoids: Secondary | ICD-10-CM | POA: Diagnosis present

## 2022-05-09 DIAGNOSIS — Z6831 Body mass index (BMI) 31.0-31.9, adult: Secondary | ICD-10-CM

## 2022-05-09 DIAGNOSIS — Z825 Family history of asthma and other chronic lower respiratory diseases: Secondary | ICD-10-CM

## 2022-05-09 DIAGNOSIS — E782 Mixed hyperlipidemia: Secondary | ICD-10-CM | POA: Diagnosis present

## 2022-05-09 DIAGNOSIS — Z8042 Family history of malignant neoplasm of prostate: Secondary | ICD-10-CM

## 2022-05-09 DIAGNOSIS — I2699 Other pulmonary embolism without acute cor pulmonale: Secondary | ICD-10-CM

## 2022-05-09 DIAGNOSIS — K625 Hemorrhage of anus and rectum: Secondary | ICD-10-CM | POA: Diagnosis not present

## 2022-05-09 DIAGNOSIS — E669 Obesity, unspecified: Secondary | ICD-10-CM | POA: Diagnosis present

## 2022-05-09 DIAGNOSIS — Z86711 Personal history of pulmonary embolism: Secondary | ICD-10-CM | POA: Diagnosis present

## 2022-05-09 DIAGNOSIS — Z79899 Other long term (current) drug therapy: Secondary | ICD-10-CM

## 2022-05-09 DIAGNOSIS — K6819 Other retroperitoneal abscess: Secondary | ICD-10-CM | POA: Diagnosis present

## 2022-05-09 DIAGNOSIS — Z87891 Personal history of nicotine dependence: Secondary | ICD-10-CM

## 2022-05-09 DIAGNOSIS — K3532 Acute appendicitis with perforation and localized peritonitis, without abscess: Principal | ICD-10-CM | POA: Diagnosis present

## 2022-05-09 DIAGNOSIS — I10 Essential (primary) hypertension: Secondary | ICD-10-CM | POA: Diagnosis present

## 2022-05-09 DIAGNOSIS — Z832 Family history of diseases of the blood and blood-forming organs and certain disorders involving the immune mechanism: Secondary | ICD-10-CM

## 2022-05-09 DIAGNOSIS — Z823 Family history of stroke: Secondary | ICD-10-CM

## 2022-05-09 DIAGNOSIS — K567 Ileus, unspecified: Secondary | ICD-10-CM | POA: Diagnosis not present

## 2022-05-09 DIAGNOSIS — Z83438 Family history of other disorder of lipoprotein metabolism and other lipidemia: Secondary | ICD-10-CM

## 2022-05-09 DIAGNOSIS — Z86010 Personal history of colonic polyps: Secondary | ICD-10-CM

## 2022-05-09 LAB — LACTIC ACID, PLASMA: Lactic Acid, Venous: 0.8 mmol/L (ref 0.5–1.9)

## 2022-05-09 LAB — COMPREHENSIVE METABOLIC PANEL WITH GFR
ALT: 29 U/L (ref 0–44)
AST: 32 U/L (ref 15–41)
Albumin: 3.7 g/dL (ref 3.5–5.0)
Alkaline Phosphatase: 81 U/L (ref 38–126)
Anion gap: 9 (ref 5–15)
BUN: 22 mg/dL (ref 8–23)
CO2: 25 mmol/L (ref 22–32)
Calcium: 8.3 mg/dL — ABNORMAL LOW (ref 8.9–10.3)
Chloride: 102 mmol/L (ref 98–111)
Creatinine, Ser: 1.16 mg/dL (ref 0.61–1.24)
GFR, Estimated: >60 mL/min
Glucose, Bld: 115 mg/dL — ABNORMAL HIGH (ref 70–99)
Potassium: 4 mmol/L (ref 3.5–5.1)
Sodium: 136 mmol/L (ref 135–145)
Total Bilirubin: 1.2 mg/dL (ref 0.3–1.2)
Total Protein: 7 g/dL (ref 6.5–8.1)

## 2022-05-09 LAB — CBC WITH DIFFERENTIAL/PLATELET
Abs Immature Granulocytes: 0.02 10*3/uL (ref 0.00–0.07)
Basophils Absolute: 0 10*3/uL (ref 0.0–0.1)
Basophils Relative: 1 %
Eosinophils Absolute: 0.1 10*3/uL (ref 0.0–0.5)
Eosinophils Relative: 1 %
HCT: 41.7 % (ref 39.0–52.0)
Hemoglobin: 14.2 g/dL (ref 13.0–17.0)
Immature Granulocytes: 0 %
Lymphocytes Relative: 12 %
Lymphs Abs: 1 10*3/uL (ref 0.7–4.0)
MCH: 30 pg (ref 26.0–34.0)
MCHC: 34.1 g/dL (ref 30.0–36.0)
MCV: 88 fL (ref 80.0–100.0)
Monocytes Absolute: 0.7 10*3/uL (ref 0.1–1.0)
Monocytes Relative: 9 %
Neutro Abs: 6.5 10*3/uL (ref 1.7–7.7)
Neutrophils Relative %: 77 %
Platelets: 163 10*3/uL (ref 150–400)
RBC: 4.74 MIL/uL (ref 4.22–5.81)
RDW: 13.3 % (ref 11.5–15.5)
WBC: 8.4 10*3/uL (ref 4.0–10.5)
nRBC: 0 % (ref 0.0–0.2)

## 2022-05-09 LAB — PROTIME-INR
INR: 3.1 — ABNORMAL HIGH (ref 0.8–1.2)
Prothrombin Time: 31.6 seconds — ABNORMAL HIGH (ref 11.4–15.2)

## 2022-05-09 LAB — URINALYSIS, ROUTINE W REFLEX MICROSCOPIC
Bilirubin Urine: NEGATIVE
Glucose, UA: NEGATIVE mg/dL
Hgb urine dipstick: NEGATIVE
Ketones, ur: 15 mg/dL — AB
Leukocytes,Ua: NEGATIVE
Nitrite: NEGATIVE
Protein, ur: NEGATIVE mg/dL
Specific Gravity, Urine: 1.02 (ref 1.005–1.030)
pH: 6 (ref 5.0–8.0)

## 2022-05-09 LAB — LIPASE, BLOOD: Lipase: 38 U/L (ref 11–51)

## 2022-05-09 MED ORDER — SODIUM CHLORIDE 0.9 % IV BOLUS
1000.0000 mL | Freq: Once | INTRAVENOUS | Status: AC
  Administered 2022-05-09: 1000 mL via INTRAVENOUS

## 2022-05-09 MED ORDER — MORPHINE SULFATE (PF) 4 MG/ML IV SOLN
4.0000 mg | Freq: Once | INTRAVENOUS | Status: AC
  Administered 2022-05-09: 4 mg via INTRAVENOUS
  Filled 2022-05-09: qty 1

## 2022-05-09 MED ORDER — PIPERACILLIN-TAZOBACTAM 3.375 G IVPB
3.3750 g | Freq: Three times a day (TID) | INTRAVENOUS | Status: DC
  Administered 2022-05-10 – 2022-05-16 (×19): 3.375 g via INTRAVENOUS
  Filled 2022-05-09 (×18): qty 50

## 2022-05-09 MED ORDER — PIPERACILLIN-TAZOBACTAM 3.375 G IVPB
3.3750 g | Freq: Three times a day (TID) | INTRAVENOUS | Status: DC
  Filled 2022-05-09: qty 50

## 2022-05-09 MED ORDER — ACETAMINOPHEN 500 MG PO TABS
1000.0000 mg | ORAL_TABLET | Freq: Once | ORAL | Status: AC
  Administered 2022-05-09: 1000 mg via ORAL
  Filled 2022-05-09: qty 2

## 2022-05-09 MED ORDER — IOHEXOL 300 MG/ML  SOLN
125.0000 mL | Freq: Once | INTRAMUSCULAR | Status: AC | PRN
  Administered 2022-05-09: 125 mL via INTRAVENOUS

## 2022-05-09 MED ORDER — PIPERACILLIN-TAZOBACTAM 3.375 G IVPB 30 MIN
3.3750 g | Freq: Once | INTRAVENOUS | Status: AC
  Administered 2022-05-09: 3.375 g via INTRAVENOUS

## 2022-05-09 MED ORDER — ONDANSETRON HCL 4 MG/2ML IJ SOLN
4.0000 mg | Freq: Once | INTRAMUSCULAR | Status: AC
  Administered 2022-05-09: 4 mg via INTRAVENOUS
  Filled 2022-05-09: qty 2

## 2022-05-09 NOTE — ED Triage Notes (Signed)
Pt sent for UC for possible bowel obstruction  Pt states abdominal pain that started approx 5 days ago and has gradually gotten worse  Had small bm today with little gas movement   Being treated for DVT with Dr. Wynona Dove

## 2022-05-09 NOTE — ED Notes (Signed)
2L O2 applied

## 2022-05-09 NOTE — ED Notes (Signed)
Called [facility] for transport at 9:32 pm.

## 2022-05-09 NOTE — ED Notes (Signed)
ED TO INPATIENT HANDOFF REPORT  ED Nurse Name and Phone #: Darryll CapersMary Ulysee Fyock, RN [phone]  S Name/Age/Gender Glenn ChartersWilliam S Wheeler 67 y.o. male Room/Bed: MH04/MH04  Code Status   Code Status: Prior  Home/SNF/Other Home Patient oriented to: self, place, time, and situation Is this baseline? Yes   Triage Complete: Triage complete  Chief Complaint Acute perforated appendicitis [K35.32]  Triage Note Pt sent for UC for possible bowel obstruction  Pt states abdominal pain that started approx 5 days ago and has gradually gotten worse  Had small bm today with little gas movement   Being treated for DVT with Dr. Wynona DoveInover    Allergies No Known Allergies  Level of Care/Admitting Diagnosis ED Disposition     ED Disposition  Admit   Condition  --   Comment  [facility] [[id number]]  Level of Care: Med-Surg [16]  [facility] if equivalent level of care is available:: No  Interfacility transfer: Yes  Covid Evaluation: Asymptomatic - no recent exposure (last 10 days) testing not required  Diagnosis: Acute perforated appendicitis [454098][[id number]]  Admitting Physician: Rodman PickleKINSINGER, LUKE AARON [[id number]]  Attending Physician: Rodman PickleKINSINGER, LUKE AARON [[id number]]  Certification:: I certify this patient will need inpatient services for at least 2 midnights  Estimated Length of Stay: 2          B Medical/Surgery History Past Medical History:  Diagnosis Date   Chest pain    Colon polyps    2001, 2007   Exertional angina 07/22/2018   History of chicken pox    childhood   Hyperlipidemia    Leishmaniasis 05-2011   Pulmonary embolus    Past Surgical History:  Procedure Laterality Date   [facility]   COLONOSCOPY  [id number]   polyps removed both times   CORONARY ULTRASOUND/IVUS N/A 07/23/2018   Procedure: Intravascular Ultrasound/IVUS;  Surgeon: Elder NegusPatwardhan, Manish J, MD;   Location: [facility] INVASIVE CV LAB;  Service: Cardiovascular;  Laterality: N/A;   LEFT HEART CATH AND CORONARY ANGIOGRAPHY N/A 07/23/2018   Procedure: LEFT HEART CATH AND CORONARY ANGIOGRAPHY;  Surgeon: Elder NegusPatwardhan, Manish J, MD;  Location: [facility] INVASIVE CV LAB;  Service: Cardiovascular;  Laterality: N/A;   ROTATOR CUFF REPAIR  2007   [facility] [place] [facility]   TENDON REPAIR  2005   right peroneal tendon-[facility]     A IV Location/Drains/Wounds Patient Lines/Drains/Airways Status     Active Line/Drains/Airways     Name Placement date Placement time Site Days   Peripheral IV 05/09/22 22 G Anterior;Left;Upper Arm 05/09/22  1850  Arm  less than 1   CVC Single Lumen 04/10/13 04/10/13  --  --  3316            Intake/Output Last 24 hours  Intake/Output Summary (Last 24 hours) at 05/09/2022 2159 Last data filed at 05/09/2022 2058 Gross per 24 hour  Intake 999 ml  Output --  Net 999 ml    Labs/Imaging Results for orders placed or performed during the hospital encounter of 05/09/22 (from the past 48 hour(s))  CBC with Differential     Status: None   Collection Time: 05/09/22  6:18 PM  Result Value Ref Range   WBC 8.4 4.0 - 10.5 K/uL   RBC 4.74 4.22 - 5.81 MIL/uL   Hemoglobin 14.2 13.0 - 17.0 g/dL   HCT 96.241.7 95.239.0 - 84.152.0 %   MCV 88.0 80.0 - 100.0 fL  MCH 30.0 26.0 - 34.0 pg   MCHC 34.1 30.0 - 36.0 g/dL   RDW 37.6 28.3 - 15.1 %   Platelets 163 150 - 400 K/uL   nRBC 0.0 0.0 - 0.2 %   Neutrophils Relative % 77 %   Neutro Abs 6.5 1.7 - 7.7 K/uL   Lymphocytes Relative 12 %   Lymphs Abs 1.0 0.7 - 4.0 K/uL   Monocytes Relative 9 %   Monocytes Absolute 0.7 0.1 - 1.0 K/uL   Eosinophils Relative 1 %   Eosinophils Absolute 0.1 0.0 - 0.5 K/uL   Basophils Relative 1 %   Basophils Absolute 0.0 0.0 - 0.1 K/uL   Immature Granulocytes 0 %   Abs Immature Granulocytes 0.02 0.00 - 0.07 K/uL    Comment: Performed at [facility], [address] [place],  [place] [address]  Comprehensive metabolic panel     Status: Abnormal   Collection Time: 05/09/22  6:18 PM  Result Value Ref Range   Sodium 136 135 - 145 mmol/L   Potassium 4.0 3.5 - 5.1 mmol/L   Chloride 102 98 - 111 mmol/L   CO2 25 22 - 32 mmol/L   Glucose, Bld 115 (H) 70 - 99 mg/dL    Comment: Glucose reference range applies only to samples taken after fasting for at least 8 hours.   BUN 22 8 - 23 mg/dL   Creatinine, Ser 7.37 0.61 - 1.24 mg/dL   Calcium 8.3 (L) 8.9 - 10.3 mg/dL   Total Protein 7.0 6.5 - 8.1 g/dL   Albumin 3.7 3.5 - 5.0 g/dL   AST 32 15 - 41 U/L   ALT 29 0 - 44 U/L   Alkaline Phosphatase 81 38 - 126 U/L   Total Bilirubin 1.2 0.3 - 1.2 mg/dL   GFR, Estimated >10 >62 mL/min    Comment: (NOTE) Calculated using the CKD-EPI Creatinine Equation (2021)    Anion gap 9 5 - 15    Comment: Performed at [facility], [address] [place], [place] [address]  Lipase, blood     Status: None   Collection Time: 05/09/22  6:18 PM  Result Value Ref Range   Lipase 38 11 - 51 U/L    Comment: Performed at [facility], [address] [place], [place] [address]  Urinalysis, Routine w reflex microscopic -Urine, Clean Catch     Status: Abnormal   Collection Time: 05/09/22  6:18 PM  Result Value Ref Range   Color, Urine YELLOW YELLOW   APPearance CLEAR CLEAR   Specific Gravity, Urine 1.020 1.005 - 1.030   pH 6.0 5.0 - 8.0   Glucose, UA NEGATIVE NEGATIVE mg/dL   Hgb urine dipstick NEGATIVE NEGATIVE   Bilirubin Urine NEGATIVE NEGATIVE   Ketones, ur 15 (A) NEGATIVE mg/dL   Protein, ur NEGATIVE NEGATIVE mg/dL   Nitrite NEGATIVE NEGATIVE   Leukocytes,Ua NEGATIVE NEGATIVE    Comment: Microscopic not done on urines with negative protein, blood, leukocytes, nitrite, or glucose < 500 mg/dL. Performed at [facility], [address] [place], [place] [address]   Protime-INR     Status: Abnormal   Collection Time: 05/09/22  6:18 PM  Result Value Ref Range    Prothrombin Time 31.6 (H) 11.4 - 15.2 seconds   INR 3.1 (H) 0.8 - 1.2    Comment: (NOTE) INR goal varies based on device and disease states. Performed at [facility], [address] [place], [place] [address]    CT ABDOMEN PELVIS W CONTRAST  Result Date: 05/09/2022 CLINICAL DATA:  Acute generalized abdominal pain. EXAM: CT ABDOMEN AND PELVIS WITH CONTRAST TECHNIQUE: Multidetector CT imaging of the abdomen and pelvis was performed using the standard protocol following bolus administration of intravenous contrast. RADIATION DOSE REDUCTION: This exam was performed according to the departmental dose-optimization program which includes automated exposure control, adjustment of the mA and/or kV according to patient size and/or use of iterative reconstruction technique. CONTRAST:  OMNIPAQUE IOHEXOL 300 MG/ML  SOLN COMPARISON:  None Available. FINDINGS: Lower chest: No acute abnormality. Hepatobiliary: No focal liver abnormality is seen. No gallstones, gallbladder wall thickening, or biliary dilatation. Pancreas: Unremarkable. No pancreatic ductal dilatation or surrounding inflammatory changes. Spleen: Normal in size without focal abnormality. Adrenals/Urinary Tract: Adrenal glands are unremarkable. Kidneys are normal, without renal calculi, focal lesion, or hydronephrosis. Bladder is unremarkable. Stomach/Bowel: The stomach is unremarkable. There is no evidence of bowel obstruction. The appendix is severely enlarged at 2 cm with a large amount of surrounding inflammation consistent with acute appendicitis. A portion of the wall of the appendix is not well visualized and perforation cannot be excluded. This is best seen on image number 67 of series 2. Vascular/Lymphatic: No significant vascular findings are present. No enlarged abdominal or pelvic lymph nodes. Reproductive: Mild prostatic enlargement. Other: No abdominal wall hernia or abnormality. No abdominopelvic ascites. Musculoskeletal: No  acute or significant osseous findings. IMPRESSION: The appendix is severely enlarged and inflamed concerning for acute appendicitis. There is a portion of the wall of the appendix which is not well visualized suggesting the possibility of perforation. Critical Value/emergent results were called by telephone at the time of interpretation on 05/09/2022 at 8:53 pm to provider Dr. Hart Rochester, who verbally acknowledged these results. Electronically Signed   By: Lupita Raider M.D.   On: 05/09/2022 20:53    Pending Labs Unresulted Labs (From admission, onward)     Start     Ordered   05/09/22 2145  Lactic acid, plasma  Now then every 2 hours,   R (with STAT occurrences)      05/09/22 2144            Vitals/Pain Today's Vitals   05/09/22 2000 05/09/22 2143 05/09/22 2143 05/09/22 2143  BP: 131/71 126/69    Pulse: 84 86    Resp: (!) 24     Temp:    (!) 100.5 F (38.1 C)  TempSrc:    Oral  SpO2: 92% 96%    Weight:      Height:      PainSc:   2      Isolation Precautions No active isolations  Medications Medications  piperacillin-tazobactam (ZOSYN) IVPB 3.375 g (3.375 g Intravenous New Bag/Given 05/09/22 2138)    Followed by  piperacillin-tazobactam (ZOSYN) IVPB 3.375 g (has no administration in time range)  sodium chloride 0.9 % bolus 1,000 mL (0 mLs Intravenous Stopped 05/09/22 2058)  morphine (PF) 4 MG/ML injection 4 mg (4 mg Intravenous Given 05/09/22 1851)  ondansetron (ZOFRAN) injection 4 mg (4 mg Intravenous Given 05/09/22 1853)  iohexol (OMNIPAQUE) 300 MG/ML solution 125 mL (125 mLs Intravenous Contrast Given 05/09/22 2008)  morphine (PF) 4 MG/ML injection 4 mg (4 mg Intravenous Given 05/09/22 2148)  acetaminophen (TYLENOL) tablet 1,000 mg (1,000 mg Oral Given 05/09/22 2148)    Mobility walks     Focused Assessments GI   R Recommendations: See Admitting Provider Note  Report given to:   Additional Notes: Darryll Capers, RN [phone]

## 2022-05-09 NOTE — Progress Notes (Signed)
Pharmacy Antibiotic Note  Glenn Wheeler is a 67 y.o. male admitted on 05/09/2022 with  intra-abdominal infection . Pharmacy has been consulted for Zosyn dosing.  Plan: Zosyn 3.375g IV q8h (4 hour infusion). Monitor renal function and overall clinical picture  De-escalate antibiotics when able   Height: 6' (182.9 cm) Weight: 105.7 kg (233 lb) IBW/kg (Calculated) : 77.6  Temp (24hrs), Avg:98.8 F (37.1 C), Min:98.8 F (37.1 C), Max:98.8 F (37.1 C)  Recent Labs  Lab 05/05/22 0931 05/09/22 1818  WBC 6.9 8.4  CREATININE 1.29* 1.16    Estimated Creatinine Clearance: 76.6 mL/min (by C-G formula based on SCr of 1.16 mg/dL).    No Known Allergies  Antimicrobials this admission: 4/9 Zosyn >>  Dose adjustments this admission: N/A  Microbiology results: N/A   Thank you for allowing pharmacy to be a part of this patient's care.  Cherylin Mylar, PharmD PGY1 Pharmacy Resident 4/9/20249:10 PM

## 2022-05-09 NOTE — ED Provider Notes (Signed)
[facility] Provider Note   CSN: [id number] Arrival date & time: 05/09/22  1811     History  Chief Complaint  Patient presents with   Abdominal Pain    Glenn Wheeler is a 67 y.o. male.  The history is provided by the patient and medical records. No language interpreter was used.  Abdominal Pain    67 year old male significant history of prior PE, DVT currently on warfarin, hyperlipidemia, colon polyps, presenting to the from the urgent care center with concerns of possible bowel obstruction.  Patient report for the past 4 to 5 days he has noticed pain to the left side of his abdomen.  Pain is described as a mild cramping sensation that does not really affect his daily activity however since yesterday he noticed increasing pain and decreasing appetite.  States even drinking water causes his pain to flareup and states he only passed a small bowel movement and having difficulty passing flatus today.  He went to urgent care center and had an x-ray done.  It was normal however he was recommended to come to the ER for further assessment.  He denies any prior history of SBO.  No prior abdominal surgeries.  Does not endorse any fever or chills no chest pain or shortness of breath denies any significant nausea vomiting.  Home Medications Prior to Admission medications   Medication Sig Start Date End Date Taking? Authorizing Provider  acetaminophen (TYLENOL) 325 MG tablet Take by mouth.    [provider]  atorvastatin (LIPITOR) 40 MG tablet Take 1 tablet (40 mg total) by mouth at bedtime. 05/12/21   Patwardhan, Manish J, MD  enoxaparin (LOVENOX) 100 MG/ML injection Inject 1 mL (100 mg total) into the skin every 12 (twelve) hours for 7 days. 04/12/22 04/19/22  Josph MachoEnnever, Peter R, MD  metoprolol succinate (TOPROL-XL) 25 MG 24 hr tablet TAKE 1 TABLET DAILY 04/08/21   Patwardhan, Anabel BeneManish J, MD  Multiple Vitamins-Minerals (MULTIVITAMIN WITH MINERALS)  tablet Take 1 tablet by mouth daily.    [provider]  nitroGLYCERIN (NITROSTAT) 0.4 MG SL tablet Place 1 tablet (0.4 mg total) under the tongue every 5 (five) minutes as needed for chest pain. Patient not taking: Reported on 02/08/2022 03/31/21 02/08/22  Elder NegusPatwardhan, Manish J, MD  sildenafil (REVATIO) 20 MG tablet Take 20 mg by mouth as needed. 09/08/21   [provider]  warfarin (COUMADIN) 10 MG tablet Take 1 tablet (10 mg total) by mouth daily. 04/13/22   Josph MachoEnnever, Peter R, MD  warfarin (COUMADIN) 5 MG tablet Take 1 tablet (5 mg total) by mouth daily. 04/07/22   Josph MachoEnnever, Peter R, MD  fluticasone (FLONASE) 50 MCG/ACT nasal spray Place 1 spray into the nose daily. 01/03/11 04/07/11  Edwyna PerfectHodgin, Thomas W, MD      Allergies    Patient has no known allergies.    Review of Systems   Review of Systems  Gastrointestinal:  Positive for abdominal pain.  All other systems reviewed and are negative.   Physical Exam Updated Vital Signs BP (!) 155/85 (BP Location: Left Arm)   Pulse 91   Temp 98.8 F (37.1 C) (Oral)   Resp 18   Ht 6' (1.829 m)   Wt 105.7 kg   SpO2 99%   BMI 31.60 kg/m  Physical Exam Vitals and nursing note reviewed.  Constitutional:      General: He is not in acute distress.    Appearance: He is well-developed. He is  obese.  HENT:     Head: Atraumatic.  Eyes:     Conjunctiva/sclera: Conjunctivae normal.  Cardiovascular:     Rate and Rhythm: Normal rate and regular rhythm.  Pulmonary:     Effort: Pulmonary effort is normal.     Breath sounds: Normal breath sounds.  Abdominal:     General: Abdomen is protuberant.     Tenderness: There is no abdominal tenderness. There is no guarding or rebound.     Hernia: No hernia is present.  Musculoskeletal:     Cervical back: Neck supple.  Skin:    Findings: No rash.  Neurological:     Mental Status: He is alert.     ED Results / Procedures / Treatments   Labs (all labs ordered are listed, but only abnormal  results are displayed) Labs Reviewed  COMPREHENSIVE METABOLIC PANEL - Abnormal; Notable for the following components:      Result Value   Glucose, Bld 115 (*)    Calcium 8.3 (*)    All other components within normal limits  URINALYSIS, ROUTINE W REFLEX MICROSCOPIC - Abnormal; Notable for the following components:   Ketones, ur 15 (*)    All other components within normal limits  PROTIME-INR - Abnormal; Notable for the following components:   Prothrombin Time 31.6 (*)    INR 3.1 (*)    All other components within normal limits  CBC WITH DIFFERENTIAL/PLATELET  LIPASE, BLOOD    EKG None  Radiology CT ABDOMEN PELVIS W CONTRAST  Result Date: 05/09/2022 CLINICAL DATA:  Acute generalized abdominal pain. EXAM: CT ABDOMEN AND PELVIS WITH CONTRAST TECHNIQUE: Multidetector CT imaging of the abdomen and pelvis was performed using the standard protocol following bolus administration of intravenous contrast. RADIATION DOSE REDUCTION: This exam was performed according to the departmental dose-optimization program which includes automated exposure control, adjustment of the mA and/or kV according to patient size and/or use of iterative reconstruction technique. CONTRAST:  OMNIPAQUE IOHEXOL 300 MG/ML  SOLN COMPARISON:  None Available. FINDINGS: Lower chest: No acute abnormality. Hepatobiliary: No focal liver abnormality is seen. No gallstones, gallbladder wall thickening, or biliary dilatation. Pancreas: Unremarkable. No pancreatic ductal dilatation or surrounding inflammatory changes. Spleen: Normal in size without focal abnormality. Adrenals/Urinary Tract: Adrenal glands are unremarkable. Kidneys are normal, without renal calculi, focal lesion, or hydronephrosis. Bladder is unremarkable. Stomach/Bowel: The stomach is unremarkable. There is no evidence of bowel obstruction. The appendix is severely enlarged at 2 cm with a large amount of surrounding inflammation consistent with acute appendicitis. A  portion of the wall of the appendix is not well visualized and perforation cannot be excluded. This is best seen on image number 67 of series 2. Vascular/Lymphatic: No significant vascular findings are present. No enlarged abdominal or pelvic lymph nodes. Reproductive: Mild prostatic enlargement. Other: No abdominal wall hernia or abnormality. No abdominopelvic ascites. Musculoskeletal: No acute or significant osseous findings. IMPRESSION: The appendix is severely enlarged and inflamed concerning for acute appendicitis. There is a portion of the wall of the appendix which is not well visualized suggesting the possibility of perforation. Critical Value/emergent results were called by telephone at the time of interpretation on 05/09/2022 at 8:53 pm to provider Dr. Hart Rochester, who verbally acknowledged these results. Electronically Signed   By: Lupita Raider M.D.   On: 05/09/2022 20:53    Procedures .Critical Care  Performed by: Fayrene Helper, PA-C Authorized by: Fayrene Helper, PA-C   Critical care provider statement:    Critical care time (minutes):  30   Critical care was time spent personally by me on the following activities:  Development of treatment plan with patient or surrogate, discussions with consultants, evaluation of patient's response to treatment, examination of patient, ordering and review of laboratory studies, ordering and review of radiographic studies, ordering and performing treatments and interventions, pulse oximetry, re-evaluation of patient's condition and review of old charts     Medications Ordered in ED Medications  sodium chloride 0.9 % bolus 1,000 mL (1,000 mLs Intravenous New Bag/Given 05/09/22 1856)  morphine (PF) 4 MG/ML injection 4 mg (4 mg Intravenous Given 05/09/22 1851)  ondansetron (ZOFRAN) injection 4 mg (4 mg Intravenous Given 05/09/22 1853)  iohexol (OMNIPAQUE) 300 MG/ML solution 125 mL (125 mLs Intravenous Contrast Given 05/09/22 2008)    ED Course/ Medical Decision  Making/ A&P                             Medical Decision Making Amount and/or Complexity of Data Reviewed Labs: ordered. Radiology: ordered.  Risk Prescription drug management.   BP (!) 155/85 (BP Location: Left Arm)   Pulse 91   Temp 98.8 F (37.1 C) (Oral)   Resp 18   Ht 6' (1.829 m)   Wt 105.7 kg   SpO2 99%   BMI 31.60 kg/m   61:43 PM  67 year old male significant history of prior PE, DVT currently on warfarin, hyperlipidemia, colon polyps, presenting to the from the urgent care center with concerns of possible bowel obstruction.  Patient report for the past 4 to 5 days he has noticed pain to the left side of his abdomen.  Pain is described as a mild cramping sensation that does not really affect his daily activity however since yesterday he noticed increasing pain and decreasing appetite.  States even drinking water causes his pain to flareup and states he only passed a small bowel movement and having difficulty passing flatus today.  He went to urgent care center and had an x-ray done.  It was normal however he was recommended to come to the ER for further assessment.  He denies any prior history of SBO.  No prior abdominal surgeries.  Does not endorse any fever or chills no chest pain or shortness of breath denies any significant nausea vomiting.  On exam this is an obese male resting comfortably in bed appears to be in no acute discomfort.  Heart with normal rate and rhythm, lungs clear to auscultation bilaterally abdomen with a protuberant abdomen with decreased bowel sounds with mild tenderness but no guarding or rebound tenderness  Vital signs notable for elevated blood pressure 155/85.  Patient is afebrile no hypoxia. Since patient was sent here from urgent care center with concerns for SBO, will consider further workup including abdominal pelvis CT scan for further assessment.  Especially in the setting of patient endorsing pain with eating, decrease in appetite, and having  typically passing flatus.  Will also check his INR level since patient is currently on warfarin.  Patient given IV fluid, and antiemetic.  8:54 PM -Labs ordered, independently viewed and interpreted by me.  Labs remarkable for normal WBC, normal electrolytes panel, normal UA, INR 3.1 -The patient was maintained on a cardiac monitor.  I personally viewed and interpreted the cardiac monitored which showed an underlying rhythm of: NSR -Imaging independently viewed and interpreted by me and I agree with radiologist's interpretation.  Result remarkable for perforated appendicitis -This patient presents to the ED  for concern of abd pain, this involves an extensive number of treatment options, and is a complaint that carries with it a high risk of complications and morbidity.  The differential diagnosis includes SBO, colitis, pancreatitis, appendicitis, cholecystitis, kidney stone, UTI, diverticular disease -Co morbidities that complicate the patient evaluation includes blood clots, colon polyps, hyperlipidemia -Treatment includes Zosyn, IVF, zofran, morphine -Reevaluation of the patient after these medicines showed that the patient improved -PCP office notes or outside notes reviewed -Discussion with specialist general surgeon Dr. Sheliah Hatch who request pt to be transfer to [facility] to be admitted by [facility].  Zosyn to be given -Escalation to admission/observation considered: patient is agreeable with admission.          Final Clinical Impression(s) / ED Diagnoses Final diagnoses:  Acute perforated appendicitis    Rx / DC Orders ED Discharge Orders     None         Fayrene Helper, PA-C 05/09/22 2117    Ernie Avena, MD 05/09/22 2134

## 2022-05-10 DIAGNOSIS — Z6831 Body mass index (BMI) 31.0-31.9, adult: Secondary | ICD-10-CM | POA: Diagnosis not present

## 2022-05-10 DIAGNOSIS — Z86010 Personal history of colonic polyps: Secondary | ICD-10-CM | POA: Diagnosis not present

## 2022-05-10 DIAGNOSIS — I2510 Atherosclerotic heart disease of native coronary artery without angina pectoris: Secondary | ICD-10-CM | POA: Diagnosis present

## 2022-05-10 DIAGNOSIS — Z8042 Family history of malignant neoplasm of prostate: Secondary | ICD-10-CM | POA: Diagnosis not present

## 2022-05-10 DIAGNOSIS — Z83438 Family history of other disorder of lipoprotein metabolism and other lipidemia: Secondary | ICD-10-CM | POA: Diagnosis not present

## 2022-05-10 DIAGNOSIS — Z8249 Family history of ischemic heart disease and other diseases of the circulatory system: Secondary | ICD-10-CM | POA: Diagnosis not present

## 2022-05-10 DIAGNOSIS — Z825 Family history of asthma and other chronic lower respiratory diseases: Secondary | ICD-10-CM | POA: Diagnosis not present

## 2022-05-10 DIAGNOSIS — Z86718 Personal history of other venous thrombosis and embolism: Secondary | ICD-10-CM | POA: Insufficient documentation

## 2022-05-10 DIAGNOSIS — I10 Essential (primary) hypertension: Secondary | ICD-10-CM | POA: Diagnosis present

## 2022-05-10 DIAGNOSIS — Z823 Family history of stroke: Secondary | ICD-10-CM | POA: Insufficient documentation

## 2022-05-10 DIAGNOSIS — E782 Mixed hyperlipidemia: Secondary | ICD-10-CM | POA: Diagnosis present

## 2022-05-10 DIAGNOSIS — Z87891 Personal history of nicotine dependence: Secondary | ICD-10-CM | POA: Diagnosis not present

## 2022-05-10 DIAGNOSIS — K625 Hemorrhage of anus and rectum: Secondary | ICD-10-CM | POA: Diagnosis not present

## 2022-05-10 DIAGNOSIS — Z7901 Long term (current) use of anticoagulants: Secondary | ICD-10-CM | POA: Diagnosis not present

## 2022-05-10 DIAGNOSIS — I739 Peripheral vascular disease, unspecified: Secondary | ICD-10-CM | POA: Diagnosis present

## 2022-05-10 DIAGNOSIS — E669 Obesity, unspecified: Secondary | ICD-10-CM | POA: Diagnosis present

## 2022-05-10 DIAGNOSIS — R109 Unspecified abdominal pain: Secondary | ICD-10-CM | POA: Diagnosis not present

## 2022-05-10 DIAGNOSIS — K3533 Acute appendicitis with perforation and localized peritonitis, with abscess: Secondary | ICD-10-CM | POA: Diagnosis present

## 2022-05-10 DIAGNOSIS — K3532 Acute appendicitis with perforation and localized peritonitis, without abscess: Secondary | ICD-10-CM | POA: Diagnosis not present

## 2022-05-10 DIAGNOSIS — R509 Fever, unspecified: Secondary | ICD-10-CM | POA: Diagnosis not present

## 2022-05-10 DIAGNOSIS — K649 Unspecified hemorrhoids: Secondary | ICD-10-CM | POA: Diagnosis present

## 2022-05-10 DIAGNOSIS — I82559 Chronic embolism and thrombosis of unspecified peroneal vein: Secondary | ICD-10-CM | POA: Diagnosis not present

## 2022-05-10 DIAGNOSIS — Z86711 Personal history of pulmonary embolism: Secondary | ICD-10-CM | POA: Diagnosis not present

## 2022-05-10 DIAGNOSIS — K3580 Unspecified acute appendicitis: Secondary | ICD-10-CM | POA: Diagnosis present

## 2022-05-10 DIAGNOSIS — K567 Ileus, unspecified: Secondary | ICD-10-CM | POA: Diagnosis not present

## 2022-05-10 DIAGNOSIS — K6819 Other retroperitoneal abscess: Secondary | ICD-10-CM | POA: Diagnosis present

## 2022-05-10 DIAGNOSIS — Z832 Family history of diseases of the blood and blood-forming organs and certain disorders involving the immune mechanism: Secondary | ICD-10-CM | POA: Diagnosis not present

## 2022-05-10 DIAGNOSIS — Z833 Family history of diabetes mellitus: Secondary | ICD-10-CM | POA: Diagnosis not present

## 2022-05-10 DIAGNOSIS — D62 Acute posthemorrhagic anemia: Secondary | ICD-10-CM | POA: Diagnosis not present

## 2022-05-10 LAB — CBC
HCT: 40.1 % (ref 39.0–52.0)
Hemoglobin: 13.4 g/dL (ref 13.0–17.0)
MCH: 30.2 pg (ref 26.0–34.0)
MCHC: 33.4 g/dL (ref 30.0–36.0)
MCV: 90.5 fL (ref 80.0–100.0)
Platelets: 148 10*3/uL — ABNORMAL LOW (ref 150–400)
RBC: 4.43 MIL/uL (ref 4.22–5.81)
RDW: 13.4 % (ref 11.5–15.5)
WBC: 8.4 10*3/uL (ref 4.0–10.5)
nRBC: 0 % (ref 0.0–0.2)

## 2022-05-10 LAB — PROTIME-INR
INR: 3 — ABNORMAL HIGH (ref 0.8–1.2)
INR: 2.3 — ABNORMAL HIGH (ref 0.8–1.2)
Prothrombin Time: 30.7 seconds — ABNORMAL HIGH (ref 11.4–15.2)
Prothrombin Time: 25.4 seconds — ABNORMAL HIGH (ref 11.4–15.2)

## 2022-05-10 LAB — BASIC METABOLIC PANEL
Anion gap: 7 (ref 5–15)
BUN: 20 mg/dL (ref 8–23)
CO2: 26 mmol/L (ref 22–32)
Calcium: 7.8 mg/dL — ABNORMAL LOW (ref 8.9–10.3)
Chloride: 102 mmol/L (ref 98–111)
Creatinine, Ser: 1.26 mg/dL — ABNORMAL HIGH (ref 0.61–1.24)
GFR, Estimated: >60 mL/min (ref >60)
Glucose, Bld: 154 mg/dL — ABNORMAL HIGH (ref 70–99)
Potassium: 3.8 mmol/L (ref 3.5–5.1)
Sodium: 135 mmol/L (ref 135–145)

## 2022-05-10 LAB — MRSA NEXT GEN BY PCR, NASAL: MRSA by PCR Next Gen: NOT DETECTED

## 2022-05-10 LAB — HEPARIN LEVEL (UNFRACTIONATED): Heparin Unfractionated: 0.52 IU/mL (ref 0.30–0.70)

## 2022-05-10 MED ORDER — ONDANSETRON HCL 4 MG/2ML IJ SOLN: 4.0000 mg | Freq: Four times a day (QID) | INTRAMUSCULAR | Status: DC | PRN

## 2022-05-10 MED ORDER — METRONIDAZOLE 500 MG/100ML IV SOLN
500.0000 mg | INTRAVENOUS | Status: AC
  Administered 2022-05-11: 500 mg via INTRAVENOUS
  Filled 2022-05-10: qty 100

## 2022-05-10 MED ORDER — DEXTROSE-NACL 5-0.45 % IV SOLN: INTRAVENOUS | Status: DC

## 2022-05-10 MED ORDER — MORPHINE SULFATE (PF) 2 MG/ML IV SOLN: 2.0000 mg | INTRAVENOUS | Status: DC | PRN

## 2022-05-10 MED ORDER — CHLORHEXIDINE GLUCONATE CLOTH 2 % EX PADS: 6.0000 | MEDICATED_PAD | CUTANEOUS | Status: AC

## 2022-05-10 MED ORDER — ACETAMINOPHEN 325 MG PO TABS
650.0000 mg | ORAL_TABLET | Freq: Four times a day (QID) | ORAL | Status: DC | PRN
  Administered 2022-05-10 – 2022-05-14 (×4): 650 mg via ORAL
  Filled 2022-05-10 (×4): qty 2

## 2022-05-10 MED ORDER — METOPROLOL TARTRATE 5 MG/5ML IV SOLN: 5.0000 mg | Freq: Four times a day (QID) | INTRAVENOUS | Status: DC | PRN

## 2022-05-10 MED ORDER — HEPARIN (PORCINE) 25000 UT/250ML-% IV SOLN
1600.0000 [IU]/h | INTRAVENOUS | Status: AC
  Administered 2022-05-10 (×2): 1600 [IU]/h via INTRAVENOUS
  Filled 2022-05-10 (×2): qty 250

## 2022-05-10 MED ORDER — CHLORHEXIDINE GLUCONATE CLOTH 2 % EX PADS
6.0000 | MEDICATED_PAD | Freq: Once | CUTANEOUS | Status: AC
  Administered 2022-05-10: 6 via TOPICAL

## 2022-05-10 MED ORDER — GABAPENTIN 100 MG PO CAPS
300.0000 mg | ORAL_CAPSULE | ORAL | Status: AC
  Administered 2022-05-11: 300 mg via ORAL
  Filled 2022-05-10: qty 3

## 2022-05-10 MED ORDER — PHYTONADIONE 5 MG PO TABS
5.0000 mg | ORAL_TABLET | Freq: Once | ORAL | Status: AC
  Administered 2022-05-10: 5 mg via ORAL
  Filled 2022-05-10: qty 1

## 2022-05-10 MED ORDER — BUPIVACAINE LIPOSOME 1.3 % IJ SUSP: 20.0000 mL | Freq: Once | INTRAMUSCULAR | Status: DC

## 2022-05-10 MED ORDER — DIPHENHYDRAMINE HCL 50 MG/ML IJ SOLN: 12.5000 mg | Freq: Four times a day (QID) | INTRAMUSCULAR | Status: DC | PRN

## 2022-05-10 MED ORDER — DIPHENHYDRAMINE HCL 12.5 MG/5ML PO ELIX: 12.5000 mg | ORAL_SOLUTION | Freq: Four times a day (QID) | ORAL | Status: DC | PRN

## 2022-05-10 MED ORDER — ACETAMINOPHEN 500 MG PO TABS
1000.0000 mg | ORAL_TABLET | ORAL | Status: AC
  Administered 2022-05-11: 1000 mg via ORAL
  Filled 2022-05-10: qty 2

## 2022-05-10 MED ORDER — ONDANSETRON 4 MG PO TBDP
4.0000 mg | ORAL_TABLET | Freq: Four times a day (QID) | ORAL | Status: DC | PRN
  Administered 2022-05-15: 4 mg via ORAL
  Filled 2022-05-10: qty 1

## 2022-05-10 MED ORDER — ACETAMINOPHEN 650 MG RE SUPP: 650.0000 mg | Freq: Four times a day (QID) | RECTAL | Status: DC | PRN

## 2022-05-10 MED ORDER — PHYTONADIONE 5 MG PO TABS
2.5000 mg | ORAL_TABLET | Freq: Once | ORAL | Status: AC
  Administered 2022-05-10: 2.5 mg via ORAL
  Filled 2022-05-10: qty 1

## 2022-05-10 MED ORDER — OXYCODONE HCL 5 MG PO TABS
5.0000 mg | ORAL_TABLET | ORAL | Status: DC | PRN
  Administered 2022-05-10 (×2): 5 mg via ORAL
  Filled 2022-05-10 (×2): qty 1

## 2022-05-10 MED ORDER — METOPROLOL SUCCINATE ER 25 MG PO TB24
25.0000 mg | ORAL_TABLET | Freq: Every day | ORAL | Status: DC
  Administered 2022-05-10 – 2022-05-15 (×4): 25 mg via ORAL
  Filled 2022-05-10 (×5): qty 1

## 2022-05-10 MED ORDER — SODIUM CHLORIDE 0.9 % IV SOLN
2.0000 g | INTRAVENOUS | Status: AC
  Administered 2022-05-11: 2 g via INTRAVENOUS
  Filled 2022-05-10: qty 20

## 2022-05-10 NOTE — Progress Notes (Signed)
Pharmacy Brief Note - Evening Anticoagulation Follow Up:  Patient is a 4 yoM who is chronically anticoagulated with warfarin for history of PE/DVT. MD ordered a dose of vitamin K and transitioned anticoagulation to heparin drip in anticipation of surgery on 4/11. MD wanted to proceed with heparin drip despite INR still being therapeutic. For full history, see note by Earl Many, PharmD from earlier today.   Today, 05/10/22 Heparin level = 0.52 is therapeutic on heparin infusion of 1600 units/hr INR = 2.3 remains therapeutic, but trending down s/p vitamin K Confirmed with RN - no signs of bleeding.   Goal: Heparin level 0.3 - 0.7  Plan: Continue heparin infusion at current rate of 1600 units/hr CBC, INR with AM labs tomorrow. Noted plan to proceed with surgery if INR <1.7. Monitor for signs of bleeding.  Per [facility] note by Dr. Michaell Cowing, hold heparin 6 hours prior to surgery. Appendectomy currently scheduled for 4/11 @ 0915. Will stop heparin on 4/11 @ 0300.   Please contact pharmacy if heparin needs to be resumed in the event of surgery delay/cancellation.   Cindi Carbon, PharmD 05/10/22 4:17 PM

## 2022-05-10 NOTE — Progress Notes (Signed)
   Glenn Wheeler  06/30/1955 [id number]  CARE TEAM:  PCP: Smith, Natalie Reid, FNP  Outpatient Care Team: Patient Care Team: Smith, Natalie Reid, FNP as PCP - General (Family Medicine)  Inpatient Treatment Team: Treatment Team: Attending Provider: Ccs, Md, MD; Nursing Instructor: Wicker, Aimee, RN; Registered Nurse: Smith, Eugenia E, RN; Technician: Jackson, Janai D, NT; Technician: Taylor, Kimberly E, NT; Consulting Physician: Ennever, Peter R, MD; Pharmacist: Legge, Justin M, RPH; Social Worker: Hoyle, Lucy R, LCSW; Utilization Review: Terry, Coralynne Y, RN   Assessment  Glenn Wheeler  67 y.o. male       Problem List:  Principal Problem:   Acute perforated appendicitis Active Problems:   History of pulmonary embolism   Acute appendicitis   Chronic anticoagulation   Stabilizing  Plan:  Continue IV antibiotics.  Bowel rest.  Holding anticoagulation.  Once INR less than 1.7, can consider appendectomy.  Perhaps soon as tomorrow. The anatomy & physiology of the digestive tract was discussed.  The pathophysiology of appendicitis and other appendiceal disorders were discussed.  Natural history risks without surgery was discussed.   I feel the risks of no intervention will lead to serious problems that outweigh the operative risks; therefore, I recommended diagnostic laparoscopy with removal of appendix to remove the pathology.  Laparoscopic & open techniques were discussed.   I noted a good likelihood this will help address the problem.   Risks such as bleeding, infection, abscess, leak, reoperation, injury to other organs, need for repair of tissues / organs, possible ostomy, hernia, heart attack, stroke, death, and other risks were discussed.  Goals of post-operative recovery were discussed as well.  We will work to minimize complications.  Questions were answered.  The patient & his wife express understanding & wish to proceed with surgery.   See if we can hold heparin 6  hours prior to potential surgery tomorrow.    I reviewed nursing notes, ED provider notes, last 24 h vitals and pain scores, last 48 h intake and output, last 24 h labs and trends, and last 24 h imaging results. I have reviewed this patient's available data, including medical history, events of note, test results, etc as part of my evaluation.  A significant portion of that time was spent in counseling.  Care during the described time interval was provided by me.  This care required moderate level of medical decision making.  05/10/2022  Kao Berkheimer C. Mahari Vankirk, MD, FACS, MASCRS Esophageal, Gastrointestinal & Colorectal Surgery Robotic and Minimally Invasive Surgery  [facility] [address] [place], [place] [address] [phone] Fax [phone] Main  CONTACT INFORMATION:  Weekday (9AM-5PM): Call [facility] main office at [phone]  Weeknight (5PM-9AM) or Weekend/Holiday: Check [email or web address] (password " [email or web address]") for General Surgery [facility] coverage  (Please, do not use SecureChat as it is not reliable communication to reach operating surgeons for immediate patient care given surgeries/outpatient duties/clinic/cross-coverage/off post-call which would lead to a delay in care.  Epic staff messaging available for outptient concerns, but may not be answered for 48 hours or more).     05/10/2022      Past Medical History:  Diagnosis Date   Chest pain    Colon polyps    2001, 2007   Exertional angina 07/22/2018   History of chicken pox    childhood   Hyperlipidemia    Leishmaniasis 05-2011   Pulmonary embolus     Past Surgical History:  Procedure   Laterality Date   CATARACT EXTRACTION, BILATERAL  2009   [place]- [facility]   COLONOSCOPY  2005,2010   polyps removed both times   CORONARY ULTRASOUND/IVUS N/A 07/23/2018   Procedure: Intravascular Ultrasound/IVUS;  Surgeon: Patwardhan, Manish J, MD;  Location: [facility]  INVASIVE CV LAB;  Service: Cardiovascular;  Laterality: N/A;   LEFT HEART CATH AND CORONARY ANGIOGRAPHY N/A 07/23/2018   Procedure: LEFT HEART CATH AND CORONARY ANGIOGRAPHY;  Surgeon: Patwardhan, Manish J, MD;  Location: [facility] INVASIVE CV LAB;  Service: Cardiovascular;  Laterality: N/A;   ROTATOR CUFF REPAIR  2007   left rotator cuff-[facility] [place]   TENDON REPAIR  2005   right peroneal tendon-[place]    Social History   Socioeconomic History   Marital status: Married    Spouse name: Not on file   Number of children: 3   Years of education: Not on file   Highest education level: Not on file  Occupational History   Not on file  Tobacco Use   Smoking status: Former    Packs/day: 1.00    Years: 7.00    Additional pack years: 0.00    Total pack years: 7.00    Types: Cigarettes    Quit date: 06/20/1983    Years since quitting: 38.9   Smokeless tobacco: Never   Tobacco comments:    quit 1984 1/2 ppd for 4 years  Vaping Use   Vaping Use: Never used  Substance and Sexual Activity   Alcohol use: No   Drug use: No   Sexual activity: Not on file  Other Topics Concern   Not on file  Social History Narrative   Married, 3 children   Right handed   Masters degree   1 cup daily   Social Determinants of Health   Financial Resource Strain: Not on file  Food Insecurity: Not on file  Transportation Needs: Not on file  Physical Activity: Not on file  Stress: Not on file  Social Connections: Not on file  Intimate Partner Violence: Not on file    Family History  Problem Relation Age of Onset   Prostate cancer Father        alive   Clotting disorder Father        blood clots   Hyperlipidemia Mother    Hypertension Mother    Diabetes Mother    Asthma Mother    Stroke Paternal Grandfather    Stroke Maternal Grandfather    Heart attack Neg Hx    Sudden death Neg Hx    Colon cancer Neg Hx     Current Facility-Administered Medications  Medication Dose Route  Frequency Provider Last Rate Last Admin   acetaminophen (TYLENOL) tablet 650 mg  650 mg Oral Q6H PRN Kinsinger, Luke Aaron, MD       Or   acetaminophen (TYLENOL) suppository 650 mg  650 mg Rectal Q6H PRN Kinsinger, Luke Aaron, MD       [START ON 05/11/2022] acetaminophen (TYLENOL) tablet 1,000 mg  1,000 mg Oral On Call to OR Dasja Brase, MD       [START ON 05/11/2022] bupivacaine liposome (EXPAREL) 1.3 % injection 266 mg  20 mL Infiltration Once Haleema Vanderheyden, MD       [START ON 05/11/2022] cefTRIAXone (ROCEPHIN) 2 g in sodium chloride 0.9 % 100 mL IVPB  2 g Intravenous On Call to OR Anniah Glick, MD       And   [START ON 05/11/2022] metroNIDAZOLE (FLAGYL) IVPB 500 mg  500   mg Intravenous On Call to OR Ameah Chanda, MD       Chlorhexidine Gluconate Cloth 2 % PADS 6 each  6 each Topical Once Manilla Strieter, MD       And   [START ON 05/11/2022] Chlorhexidine Gluconate Cloth 2 % PADS 6 each  6 each Topical On Call to OR Lorriann Hansmann, MD       dextrose 5 %-0.45 % sodium chloride infusion   Intravenous Continuous Kinsinger, Luke Aaron, MD 75 mL/hr at 05/10/22 1210 Restarted at 05/10/22 1210   diphenhydrAMINE (BENADRYL) 12.5 MG/5ML elixir 12.5 mg  12.5 mg Oral Q6H PRN Kinsinger, Luke Aaron, MD       Or   diphenhydrAMINE (BENADRYL) injection 12.5 mg  12.5 mg Intravenous Q6H PRN Kinsinger, Luke Aaron, MD       [START ON 05/11/2022] gabapentin (NEURONTIN) capsule 300 mg  300 mg Oral On Call to OR Weldon Nouri, MD       heparin ADULT infusion 100 units/mL (25000 units/250mL)  1,600 Units/hr Intravenous Continuous Legge, Justin M, RPH 16 mL/hr at 05/10/22 0915 1,600 Units/hr at 05/10/22 0915   metoprolol succinate (TOPROL-XL) 24 hr tablet 25 mg  25 mg Oral QHS Meuth, Brooke A, PA-C       metoprolol tartrate (LOPRESSOR) injection 5 mg  5 mg Intravenous Q6H PRN Kinsinger, Luke Aaron, MD       morphine (PF) 2 MG/ML injection 2 mg  2 mg Intravenous Q2H PRN Kinsinger, Luke Aaron, MD       ondansetron (ZOFRAN-ODT)  disintegrating tablet 4 mg  4 mg Oral Q6H PRN Kinsinger, Luke Aaron, MD       Or   ondansetron (ZOFRAN) injection 4 mg  4 mg Intravenous Q6H PRN Kinsinger, Luke Aaron, MD       oxyCODONE (Oxy IR/ROXICODONE) immediate release tablet 5 mg  5 mg Oral Q4H PRN Kinsinger, Luke Aaron, MD   5 mg at 05/10/22 0735   piperacillin-tazobactam (ZOSYN) IVPB 3.375 g  3.375 g Intravenous Q8H Davis, Sydney, RPH 12.5 mL/hr at 05/10/22 0539 Infusion Verify at 05/10/22 0539     No Known Allergies   BP 118/74 (BP Location: Right Arm)   Pulse 83   Temp 99.8 F (37.7 C) (Oral)   Resp 18   Ht 6' (1.829 m)   Wt 105.7 kg   SpO2 99%   BMI 31.60 kg/m     Results:   Labs: Results for orders placed or performed during the hospital encounter of 05/09/22 (from the past 48 hour(s))  CBC with Differential     Status: None   Collection Time: 05/09/22  6:18 PM  Result Value Ref Range   WBC 8.4 4.0 - 10.5 K/uL   RBC 4.74 4.22 - 5.81 MIL/uL   Hemoglobin 14.2 13.0 - 17.0 g/dL   HCT 41.7 39.0 - 52.0 %   MCV 88.0 80.0 - 100.0 fL   MCH 30.0 26.0 - 34.0 pg   MCHC 34.1 30.0 - 36.0 g/dL   RDW 13.3 11.5 - 15.5 %   Platelets 163 150 - 400 K/uL   nRBC 0.0 0.0 - 0.2 %   Neutrophils Relative % 77 %   Neutro Abs 6.5 1.7 - 7.7 K/uL   Lymphocytes Relative 12 %   Lymphs Abs 1.0 0.7 - 4.0 K/uL   Monocytes Relative 9 %   Monocytes Absolute 0.7 0.1 - 1.0 K/uL   Eosinophils Relative 1 %   Eosinophils Absolute 0.1 0.0 - 0.5 K/uL     Basophils Relative 1 %   Basophils Absolute 0.0 0.0 - 0.1 K/uL   Immature Granulocytes 0 %   Abs Immature Granulocytes 0.02 0.00 - 0.07 K/uL    Comment: Performed at [facility] [place], [address] [place], [place] [address]  Comprehensive metabolic panel     Status: Abnormal   Collection Time: 05/09/22  6:18 PM  Result Value Ref Range   Sodium 136 135 - 145 mmol/L   Potassium 4.0 3.5 - 5.1 mmol/L   Chloride 102 98 - 111 mmol/L   CO2 25 22 - 32 mmol/L   Glucose, Bld 115 (H) 70 -  99 mg/dL    Comment: Glucose reference range applies only to samples taken after fasting for at least 8 hours.   BUN 22 8 - 23 mg/dL   Creatinine, Ser 1.16 0.61 - 1.24 mg/dL   Calcium 8.3 (L) 8.9 - 10.3 mg/dL   Total Protein 7.0 6.5 - 8.1 g/dL   Albumin 3.7 3.5 - 5.0 g/dL   AST 32 15 - 41 U/L   ALT 29 0 - 44 U/L   Alkaline Phosphatase 81 38 - 126 U/L   Total Bilirubin 1.2 0.3 - 1.2 mg/dL   GFR, Estimated >60 >60 mL/min    Comment: (NOTE) Calculated using the CKD-EPI Creatinine Equation (2021)    Anion gap 9 5 - 15    Comment: Performed at [facility] [place], [address] [place], [place] [address]  Lipase, blood     Status: None   Collection Time: 05/09/22  6:18 PM  Result Value Ref Range   Lipase 38 11 - 51 U/L    Comment: Performed at [facility] [place], [address] [place], [place] [address]  Urinalysis, Routine w reflex microscopic -Urine, Clean Catch     Status: Abnormal   Collection Time: 05/09/22  6:18 PM  Result Value Ref Range   Color, Urine YELLOW YELLOW   APPearance CLEAR CLEAR   Specific Gravity, Urine 1.020 1.005 - 1.030   pH 6.0 5.0 - 8.0   Glucose, UA NEGATIVE NEGATIVE mg/dL   Hgb urine dipstick NEGATIVE NEGATIVE   Bilirubin Urine NEGATIVE NEGATIVE   Ketones, ur 15 (A) NEGATIVE mg/dL   Protein, ur NEGATIVE NEGATIVE mg/dL   Nitrite NEGATIVE NEGATIVE   Leukocytes,Ua NEGATIVE NEGATIVE    Comment: Microscopic not done on urines with negative protein, blood, leukocytes, nitrite, or glucose < 500 mg/dL. Performed at [facility] [place], [address] [place], [place] [address]   Protime-INR     Status: Abnormal   Collection Time: 05/09/22  6:18 PM  Result Value Ref Range   Prothrombin Time 31.6 (H) 11.4 - 15.2 seconds   INR 3.1 (H) 0.8 - 1.2    Comment: (NOTE) INR goal varies based on device and disease states. Performed at [facility] [place], [address] [place], [place] [address]   Lactic acid, plasma     Status: None    Collection Time: 05/09/22  9:45 PM  Result Value Ref Range   Lactic Acid, Venous 0.8 0.5 - 1.9 mmol/L    Comment: Performed at [facility] [place], [address] [place],  [address]  Basic metabolic panel     Status: Abnormal   Collection Time: 05/10/22  4:38 AM  Result Value Ref Range   Sodium 135 135 - 145 mmol/L   Potassium 3.8 3.5 - 5.1 mmol/L   Chloride   102 98 - 111 mmol/L   CO2 26 22 - 32 mmol/L   Glucose, Bld 154 (H) 70 - 99 mg/dL    Comment: Glucose reference range applies only to samples taken after fasting for at least 8 hours.   BUN 20 8 - 23 mg/dL   Creatinine, Ser 1.26 (H) 0.61 - 1.24 mg/dL   Calcium 7.8 (L) 8.9 - 10.3 mg/dL   GFR, Estimated >60 >60 mL/min    Comment: (NOTE) Calculated using the CKD-EPI Creatinine Equation (2021)    Anion gap 7 5 - 15    Comment: Performed at [facility], [address] [place], [place] [address]  CBC     Status: Abnormal   Collection Time: 05/10/22  4:38 AM  Result Value Ref Range   WBC 8.4 4.0 - 10.5 K/uL   RBC 4.43 4.22 - 5.81 MIL/uL   Hemoglobin 13.4 13.0 - 17.0 g/dL   HCT 40.1 39.0 - 52.0 %   MCV 90.5 80.0 - 100.0 fL   MCH 30.2 26.0 - 34.0 pg   MCHC 33.4 30.0 - 36.0 g/dL   RDW 13.4 11.5 - 15.5 %   Platelets 148 (L) 150 - 400 K/uL   nRBC 0.0 0.0 - 0.2 %    Comment: Performed at [facility], [address] [place], [place] [address]  Protime-INR     Status: Abnormal   Collection Time: 05/10/22  6:11 AM  Result Value Ref Range   Prothrombin Time 30.7 (H) 11.4 - 15.2 seconds   INR 3.0 (H) 0.8 - 1.2    Comment: (NOTE) INR goal varies based on device and disease states. Performed at [facility], [address] [place], [place] [address]     Imaging / Studies: CT ABDOMEN PELVIS W CONTRAST  Result Date: 05/09/2022 CLINICAL DATA:  Acute generalized abdominal pain. EXAM: CT ABDOMEN AND PELVIS WITH CONTRAST TECHNIQUE: Multidetector CT imaging of the  abdomen and pelvis was performed using the standard protocol following bolus administration of intravenous contrast. RADIATION DOSE REDUCTION: This exam was performed according to the departmental dose-optimization program which includes automated exposure control, adjustment of the mA and/or kV according to patient size and/or use of iterative reconstruction technique. CONTRAST:  125mL OMNIPAQUE IOHEXOL 300 MG/ML  SOLN COMPARISON:  None Available. FINDINGS: Lower chest: No acute abnormality. Hepatobiliary: No focal liver abnormality is seen. No gallstones, gallbladder wall thickening, or biliary dilatation. Pancreas: Unremarkable. No pancreatic ductal dilatation or surrounding inflammatory changes. Spleen: Normal in size without focal abnormality. Adrenals/Urinary Tract: Adrenal glands are unremarkable. Kidneys are normal, without renal calculi, focal lesion, or hydronephrosis. Bladder is unremarkable. Stomach/Bowel: The stomach is unremarkable. There is no evidence of bowel obstruction. The appendix is severely enlarged at 2 cm with a large amount of surrounding inflammation consistent with acute appendicitis. A portion of the wall of the appendix is not well visualized and perforation cannot be excluded. This is best seen on image number 67 of series 2. Vascular/Lymphatic: No significant vascular findings are present. No enlarged abdominal or pelvic lymph nodes. Reproductive: Mild prostatic enlargement. Other: No abdominal wall hernia or abnormality. No abdominopelvic ascites. Musculoskeletal: No acute or significant osseous findings. IMPRESSION: The appendix is severely enlarged and inflamed concerning for acute appendicitis. There is a portion of the wall of the appendix which is not well visualized suggesting the possibility of perforation. Critical Value/emergent results were called by telephone at the time of interpretation on 05/09/2022 at 8:53 pm to provider   Dr. Lawson, who verbally acknowledged these  results. Electronically Signed   By: James  Green Jr M.D.   On: 05/09/2022 20:53    Medications / Allergies: per chart  Antibiotics: Anti-infectives (From admission, onward)    Start     Dose/Rate Route Frequency Ordered Stop   05/11/22 0600  cefTRIAXone (ROCEPHIN) 2 g in sodium chloride 0.9 % 100 mL IVPB       See Hyperspace for full Linked Orders Report.   2 g 200 mL/hr over 30 Minutes Intravenous On call to O.R. 05/10/22 1131 05/12/22 0559   05/11/22 0600  metroNIDAZOLE (FLAGYL) IVPB 500 mg       See Hyperspace for full Linked Orders Report.   500 mg 100 mL/hr over 60 Minutes Intravenous On call to O.R. 05/10/22 1131 05/12/22 0559   05/10/22 0600  piperacillin-tazobactam (ZOSYN) IVPB 3.375 g       See Hyperspace for full Linked Orders Report.   3.375 g 12.5 mL/hr over 240 Minutes Intravenous Every 8 hours 05/09/22 2138     05/09/22 2145  piperacillin-tazobactam (ZOSYN) IVPB 3.375 g       See Hyperspace for full Linked Orders Report.   3.375 g 100 mL/hr over 30 Minutes Intravenous  Once 05/09/22 2138 05/09/22 2208   05/09/22 2130  piperacillin-tazobactam (ZOSYN) IVPB 3.375 g  Status:  Discontinued        3.375 g 12.5 mL/hr over 240 Minutes Intravenous Every 8 hours 05/09/22 2107 05/09/22 2138         Note: Portions of this report may have been transcribed using voice recognition software. Every effort was made to ensure accuracy; however, inadvertent computerized transcription errors may be present.   Any transcriptional errors that result from this process are unintentional.    Layn Kye C. Ilisa Hayworth, MD, FACS, MASCRS Esophageal, Gastrointestinal & Colorectal Surgery Robotic and Minimally Invasive Surgery  [facility] [address], [address] [place], [place] [address] [phone] Fax [phone] Main  CONTACT INFORMATION:  Weekday (9AM-5PM): Call [facility] main office at [phone]  Weeknight (5PM-9AM) or  Weekend/Holiday: Check [email or web address] (password " [email or web address]") for General Surgery [facility] coverage  (Please, do not use SecureChat as it is not reliable communication to reach operating surgeons for immediate patient care given surgeries/outpatient duties/clinic/cross-coverage/off post-call which would lead to a delay in care.  Epic staff messaging available for outptient concerns, but may not be answered for 48 hours or more).      05/10/2022  12:29 PM  

## 2022-05-10 NOTE — H&P (View-Only) (Signed)
Glenn Wheeler  1955-07-07 [id number]  CARE TEAM:  PCP: Adolph Pollack, FNP  Outpatient Care Team: Patient Care Team: Adolph Pollack, FNP as PCP - General (Family Medicine)  Inpatient Treatment Team: Treatment Team: Attending Provider: Montez Morita, Md, MD; Nursing Instructor: Orlin Hilding, RN; Registered Nurse: Sabino Snipes, RN; Technician: Verlin Grills, NT; Technician: Misty Stanley, NT; Consulting Physician: [name], MD; Pharmacist: Hessie Knows, [facility]; Social Worker: Anselm Pancoast, [place]; Utilization Review: Georgena Spurling, RN   Assessment  Glenn Wheeler  67 y.o. male       Problem List:  Principal Problem:   Acute perforated appendicitis Active Problems:   History of pulmonary embolism   Acute appendicitis   Chronic anticoagulation   Stabilizing  Plan:  Continue IV antibiotics.  Bowel rest.  Holding anticoagulation.  Once INR less than 1.7, can consider appendectomy.  Perhaps soon as tomorrow. The anatomy & physiology of the digestive tract was discussed.  The pathophysiology of appendicitis and other appendiceal disorders were discussed.  Natural history risks without surgery was discussed.   I feel the risks of no intervention will lead to serious problems that outweigh the operative risks; therefore, I recommended diagnostic laparoscopy with removal of appendix to remove the pathology.  Laparoscopic & open techniques were discussed.   I noted a good likelihood this will help address the problem.   Risks such as bleeding, infection, abscess, leak, reoperation, injury to other organs, need for repair of tissues / organs, possible ostomy, hernia, heart attack, stroke, death, and other risks were discussed.  Goals of post-operative recovery were discussed as well.  We will work to minimize complications.  Questions were answered.  The patient & his wife express understanding & wish to proceed with surgery.   See if we can hold heparin 6  hours prior to potential surgery tomorrow.    I reviewed nursing notes, ED provider notes, last 24 h vitals and pain scores, last 48 h intake and output, last 24 h labs and trends, and last 24 h imaging results. I have reviewed this patient's available data, including medical history, events of note, test results, etc as part of my evaluation.  A significant portion of that time was spent in counseling.  Care during the described time interval was provided by me.  This care required moderate level of medical decision making.  05/10/2022  Ardeth Sportsman, MD, FACS, MASCRS Esophageal, Gastrointestinal & Colorectal Surgery Robotic and Minimally Invasive Surgery  [facility] [address], [address] [place], [place] [address] [phone] Fax [phone] Main  CONTACT INFORMATION:  Weekday (9AM-5PM): Call [facility] main office at [phone]  Weeknight (5PM-9AM) or Weekend/Holiday: Check [email or web address] (password " [email or web address]") for General Surgery [facility] coverage  (Please, do not use SecureChat as it is not reliable communication to reach operating surgeons for immediate patient care given surgeries/outpatient duties/clinic/cross-coverage/off post-call which would lead to a delay in care.  Epic staff messaging available for outptient concerns, but may not be answered for 48 hours or more).     05/10/2022      Past Medical History:  Diagnosis Date   Chest pain    Colon polyps    2001, 2007   Exertional angina 07/22/2018   History of chicken pox    childhood   Hyperlipidemia    Leishmaniasis 05-2011   Pulmonary embolus     Past Surgical History:  Procedure  Laterality Date   CATARACT EXTRACTION, BILATERAL  2009   [facility]   COLONOSCOPY  [phone]   polyps removed both times   CORONARY ULTRASOUND/IVUS N/A 07/23/2018   Procedure: Intravascular Ultrasound/IVUS;  Surgeon: Elder Negus, MD;  Location: [facility]  INVASIVE CV LAB;  Service: Cardiovascular;  Laterality: N/A;   LEFT HEART CATH AND CORONARY ANGIOGRAPHY N/A 07/23/2018   Procedure: LEFT HEART CATH AND CORONARY ANGIOGRAPHY;  Surgeon: Elder Negus, MD;  Location: [facility] INVASIVE CV LAB;  Service: Cardiovascular;  Laterality: N/A;   ROTATOR CUFF REPAIR  2007   [facility]ator cuff[facility]   TENDON REPAIR  2005   right peroneal tendon-[facility]    Social History   Socioeconomic History   Marital status: Married    Spouse name: Not on file   Number of children: 3   Years of education: Not on file   Highest education level: Not on file  Occupational History   Not on file  Tobacco Use   Smoking status: Former    Packs/day: 1.00    Years: 7.00    Additional pack years: 0.00    Total pack years: 7.00    Types: Cigarettes    Quit date: 06/20/1983    Years since quitting: 38.9   Smokeless tobacco: Never   Tobacco comments:    quit 1984 1/2 ppd for 4 years  Vaping Use   Vaping Use: Never used  Substance and Sexual Activity   Alcohol use: No   Drug use: No   Sexual activity: Not on file  Other Topics Concern   Not on file  Social History Narrative   Married, 3 children   Right handed   Masters degree   1 cup daily   Social Determinants of Health   Financial Resource Strain: Not on file  Food Insecurity: Not on file  Transportation Needs: Not on file  Physical Activity: Not on file  Stress: Not on file  Social Connections: Not on file  Intimate Partner Violence: Not on file    Family History  Problem Relation Age of Onset   Prostate cancer Father        alive   Clotting disorder Father        blood clots   Hyperlipidemia Mother    Hypertension Mother    Diabetes Mother    Asthma Mother    Stroke Paternal Grandfather    Stroke Maternal Grandfather    Heart attack Neg Hx    Sudden death Neg Hx    Colon cancer Neg Hx     Current Facility-Administered Medications  Medication Dose Route  Frequency Provider Last Rate Last Admin   acetaminophen (TYLENOL) tablet 650 mg  650 mg Oral Q6H PRN Kinsinger, De Blanch, MD       Or   acetaminophen (TYLENOL) suppository 650 mg  650 mg Rectal Q6H PRN Kinsinger, De Blanch, MD       [START ON 05/11/2022] acetaminophen (TYLENOL) tablet 1,000 mg  1,000 mg Oral On Call to OR Karie Soda, MD       [START ON 05/11/2022] bupivacaine liposome (EXPAREL) 1.3 % injection 266 mg  20 mL Infiltration Once Karie Soda, MD       [START ON 05/11/2022] cefTRIAXone (ROCEPHIN) 2 g in sodium chloride 0.9 % 100 mL IVPB  2 g Intravenous On Call to OR Karie Soda, MD       And   [START ON 05/11/2022] metroNIDAZOLE (FLAGYL) IVPB 500 mg  500  mg Intravenous On Call to OR Karie Soda, MD       Chlorhexidine Gluconate Cloth 2 % PADS 6 each  6 each Topical Once Karie Soda, MD       And   [START ON 05/11/2022] Chlorhexidine Gluconate Cloth 2 % PADS 6 each  6 each Topical On Call to OR Karie Soda, MD       dextrose 5 %-0.45 % sodium chloride infusion   Intravenous Continuous Kinsinger, De Blanch, MD 75 mL/hr at 05/10/22 1210 Restarted at 05/10/22 1210   diphenhydrAMINE (BENADRYL) 12.5 MG/5ML elixir 12.5 mg  12.5 mg Oral Q6H PRN Kinsinger, De Blanch, MD       Or   diphenhydrAMINE (BENADRYL) injection 12.5 mg  12.5 mg Intravenous Q6H PRN Kinsinger, De Blanch, MD       Melene Muller ON 05/11/2022] gabapentin (NEURONTIN) capsule 300 mg  300 mg Oral On Call to OR Karie Soda, MD       heparin ADULT infusion 100 units/mL (25000 units/214mL)  1,600 Units/hr Intravenous Continuous Hessie Knows, RPH 16 mL/hr at 05/10/22 0915 1,600 Units/hr at 05/10/22 0915   metoprolol succinate (TOPROL-XL) 24 hr tablet 25 mg  25 mg Oral QHS Meuth, Brooke A, PA-C       metoprolol tartrate (LOPRESSOR) injection 5 mg  5 mg Intravenous Q6H PRN Kinsinger, De Blanch, MD       morphine (PF) 2 MG/ML injection 2 mg  2 mg Intravenous Q2H PRN Kinsinger, De Blanch, MD       ondansetron (ZOFRAN-ODT)  disintegrating tablet 4 mg  4 mg Oral Q6H PRN Kinsinger, De Blanch, MD       Or   ondansetron [facility]) injection 4 mg  4 mg Intravenous Q6H PRN Kinsinger, De Blanch, MD       oxyCODONE (Oxy IR/ROXICODONE) immediate release tablet 5 mg  5 mg Oral Q4H PRN Kinsinger, De Blanch, MD   5 mg at 05/10/22 0735   piperacillin-tazobactam (ZOSYN) IVPB 3.375 g  3.375 g Intravenous Q8H Cherylin Mylar, RPH 12.5 mL/hr at 05/10/22 0539 Infusion Verify at 05/10/22 0539     No Known Allergies   BP 118/74 (BP Location: Right Arm)   Pulse 83   Temp 99.8 F (37.7 C) (Oral)   Resp 18   Ht 6' (1.829 m)   Wt 105.7 kg   SpO2 99%   BMI 31.60 kg/m     Results:   Labs: Results for orders placed or performed during the hospital encounter of 05/09/22 (from the past 48 hour(s))  CBC with Differential     Status: None   Collection Time: 05/09/22  6:18 PM  Result Value Ref Range   WBC 8.4 4.0 - 10.5 K/uL   RBC 4.74 4.22 - 5.81 MIL/uL   Hemoglobin 14.2 13.0 - 17.0 g/dL   HCT 94.1 74.0 - 81.4 %   MCV 88.0 80.0 - 100.0 fL   MCH 30.0 26.0 - 34.0 pg   MCHC 34.1 30.0 - 36.0 g/dL   RDW 48.1 85.6 - 31.4 %   Platelets 163 150 - 400 K/uL   nRBC 0.0 0.0 - 0.2 %   Neutrophils Relative % 77 %   Neutro Abs 6.5 1.7 - 7.7 K/uL   Lymphocytes Relative 12 %   Lymphs Abs 1.0 0.7 - 4.0 K/uL   Monocytes Relative 9 %   Monocytes Absolute 0.7 0.1 - 1.0 K/uL   Eosinophils Relative 1 %   Eosinophils Absolute 0.1 0.0 - 0.5 K/uL  Basophils Relative 1 %   Basophils Absolute 0.0 0.0 - 0.1 K/uL   Immature Granulocytes 0 %   Abs Immature Granulocytes 0.02 0.00 - 0.07 K/uL    Comment: Performed at [facility] [place], [address] [place], [place] [address]  Comprehensive metabolic panel     Status: Abnormal   Collection Time: 05/09/22  6:18 PM  Result Value Ref Range   Sodium 136 135 - 145 mmol/L   Potassium 4.0 3.5 - 5.1 mmol/L   Chloride 102 98 - 111 mmol/L   CO2 25 22 - 32 mmol/L   Glucose, Bld 115 (H) 70 -  99 mg/dL    Comment: Glucose reference range applies only to samples taken after fasting for at least 8 hours.   BUN 22 8 - 23 mg/dL   Creatinine, Ser 8.291.16 0.61 - 1.24 mg/dL   Calcium 8.3 (L) 8.9 - 10.3 mg/dL   Total Protein 7.0 6.5 - 8.1 g/dL   Albumin 3.7 3.5 - 5.0 g/dL   AST 32 15 - 41 U/L   ALT 29 0 - 44 U/L   Alkaline Phosphatase 81 38 - 126 U/L   Total Bilirubin 1.2 0.3 - 1.2 mg/dL   GFR, Estimated >56>60 >21>60 mL/min    Comment: (NOTE) Calculated using the CKD-EPI Creatinine Equation (2021)    Anion gap 9 5 - 15    Comment: Performed at [facility] [place], [address] [place], [place] [address]  Lipase, blood     Status: None   Collection Time: 05/09/22  6:18 PM  Result Value Ref Range   Lipase 38 11 - 51 U/L    Comment: Performed at [facility] [place], [address] [place], [place] [address]  Urinalysis, Routine w reflex microscopic -Urine, Clean Catch     Status: Abnormal   Collection Time: 05/09/22  6:18 PM  Result Value Ref Range   Color, Urine YELLOW YELLOW   APPearance CLEAR CLEAR   Specific Gravity, Urine 1.020 1.005 - 1.030   pH 6.0 5.0 - 8.0   Glucose, UA NEGATIVE NEGATIVE mg/dL   Hgb urine dipstick NEGATIVE NEGATIVE   Bilirubin Urine NEGATIVE NEGATIVE   Ketones, ur 15 (A) NEGATIVE mg/dL   Protein, ur NEGATIVE NEGATIVE mg/dL   Nitrite NEGATIVE NEGATIVE   Leukocytes,Ua NEGATIVE NEGATIVE    Comment: Microscopic not done on urines with negative protein, blood, leukocytes, nitrite, or glucose < 500 mg/dL. Performed at [facility] [place], [address] [place], [place] [address]   Protime-INR     Status: Abnormal   Collection Time: 05/09/22  6:18 PM  Result Value Ref Range   Prothrombin Time 31.6 (H) 11.4 - 15.2 seconds   INR 3.1 (H) 0.8 - 1.2    Comment: (NOTE) INR goal varies based on device and disease states. Performed at [facility] [place], [address] [place], [place] [address]   Lactic acid, plasma     Status: None    Collection Time: 05/09/22  9:45 PM  Result Value Ref Range   Lactic Acid, Venous 0.8 0.5 - 1.9 mmol/L    Comment: Performed at [facility] [place], [address] [place], [place] [address]  Basic metabolic panel     Status: Abnormal   Collection Time: 05/10/22  4:38 AM  Result Value Ref Range   Sodium 135 135 - 145 mmol/L   Potassium 3.8 3.5 - 5.1 mmol/L   Chloride  102 98 - 111 mmol/L   CO2 26 22 - 32 mmol/L   Glucose, Bld 154 (H) 70 - 99 mg/dL    Comment: Glucose reference range applies only to samples taken after fasting for at least 8 hours.   BUN 20 8 - 23 mg/dL   Creatinine, Ser 9.51 (H) 0.61 - 1.24 mg/dL   Calcium 7.8 (L) 8.9 - 10.3 mg/dL   GFR, Estimated >88 >41 mL/min    Comment: (NOTE) Calculated using the CKD-EPI Creatinine Equation (2021)    Anion gap 7 5 - 15    Comment: Performed at [facility], [address] [place], [place] [address]  CBC     Status: Abnormal   Collection Time: 05/10/22  4:38 AM  Result Value Ref Range   WBC 8.4 4.0 - 10.5 K/uL   RBC 4.43 4.22 - 5.81 MIL/uL   Hemoglobin 13.4 13.0 - 17.0 g/dL   HCT 01.6 01.0 - 93.2 %   MCV 90.5 80.0 - 100.0 fL   MCH 30.2 26.0 - 34.0 pg   MCHC 33.4 30.0 - 36.0 g/dL   RDW 35.5 73.2 - 20.2 %   Platelets 148 (L) 150 - 400 K/uL   nRBC 0.0 0.0 - 0.2 %    Comment: Performed at [facility], [address], [place], [place] [address]  Protime-INR     Status: Abnormal   Collection Time: 05/10/22  6:11 AM  Result Value Ref Range   Prothrombin Time 30.7 (H) 11.4 - 15.2 seconds   INR 3.0 (H) 0.8 - 1.2    Comment: (NOTE) INR goal varies based on device and disease states. Performed at [facility], [address] [place], [place] [address]     Imaging / Studies: CT ABDOMEN PELVIS W CONTRAST  Result Date: 05/09/2022 CLINICAL DATA:  Acute generalized abdominal pain. EXAM: CT ABDOMEN AND PELVIS WITH CONTRAST TECHNIQUE: Multidetector CT imaging of the  abdomen and pelvis was performed using the standard protocol following bolus administration of intravenous contrast. RADIATION DOSE REDUCTION: This exam was performed according to the departmental dose-optimization program which includes automated exposure control, adjustment of the mA and/or kV according to patient size and/or use of iterative reconstruction technique. CONTRAST:  OMNIPAQUE IOHEXOL 300 MG/ML  SOLN COMPARISON:  None Available. FINDINGS: Lower chest: No acute abnormality. Hepatobiliary: No focal liver abnormality is seen. No gallstones, gallbladder wall thickening, or biliary dilatation. Pancreas: Unremarkable. No pancreatic ductal dilatation or surrounding inflammatory changes. Spleen: Normal in size without focal abnormality. Adrenals/Urinary Tract: Adrenal glands are unremarkable. Kidneys are normal, without renal calculi, focal lesion, or hydronephrosis. Bladder is unremarkable. Stomach/Bowel: The stomach is unremarkable. There is no evidence of bowel obstruction. The appendix is severely enlarged at 2 cm with a large amount of surrounding inflammation consistent with acute appendicitis. A portion of the wall of the appendix is not well visualized and perforation cannot be excluded. This is best seen on image number 67 of series 2. Vascular/Lymphatic: No significant vascular findings are present. No enlarged abdominal or pelvic lymph nodes. Reproductive: Mild prostatic enlargement. Other: No abdominal wall hernia or abnormality. No abdominopelvic ascites. Musculoskeletal: No acute or significant osseous findings. IMPRESSION: The appendix is severely enlarged and inflamed concerning for acute appendicitis. There is a portion of the wall of the appendix which is not well visualized suggesting the possibility of perforation. Critical Value/emergent results were called by telephone at the time of interpretation on 05/09/2022 at 8:53 pm to provider  Dr. Hart Rochester, who verbally acknowledged these  results. Electronically Signed   By: Lupita Raider M.D.   On: 05/09/2022 20:53    Medications / Allergies: per chart  Antibiotics: Anti-infectives (From admission, onward)    Start     Dose/Rate Route Frequency Ordered Stop   05/11/22 0600  cefTRIAXone (ROCEPHIN) 2 g in sodium chloride 0.9 % 100 mL IVPB       See Hyperspace for full Linked Orders Report.   2 g 200 mL/hr over 30 Minutes Intravenous On call to O.R. 05/10/22 1131 05/12/22 0559   05/11/22 0600  metroNIDAZOLE (FLAGYL) IVPB 500 mg       See Hyperspace for full Linked Orders Report.   500 mg 100 mL/hr over 60 Minutes Intravenous On call to O.R. 05/10/22 1131 05/12/22 0559   05/10/22 0600  piperacillin-tazobactam (ZOSYN) IVPB 3.375 g       See Hyperspace for full Linked Orders Report.   3.375 g 12.5 mL/hr over 240 Minutes Intravenous Every 8 hours 05/09/22 2138     05/09/22 2145  piperacillin-tazobactam (ZOSYN) IVPB 3.375 g       See Hyperspace for full Linked Orders Report.   3.375 g 100 mL/hr over 30 Minutes Intravenous  Once 05/09/22 2138 05/09/22 2208   05/09/22 2130  piperacillin-tazobactam (ZOSYN) IVPB 3.375 g  Status:  Discontinued        3.375 g 12.5 mL/hr over 240 Minutes Intravenous Every 8 hours 05/09/22 2107 05/09/22 2138         Note: Portions of this report may have been transcribed using voice recognition software. Every effort was made to ensure accuracy; however, inadvertent computerized transcription errors may be present.   Any transcriptional errors that result from this process are unintentional.    Ardeth Sportsman, MD, FACS, MASCRS Esophageal, Gastrointestinal & Colorectal Surgery Robotic and Minimally Invasive Surgery  [facility] [address] [place], [place] [address] [phone] Fax [phone] Main  CONTACT INFORMATION:  Weekday (9AM-5PM): Call [facility] main office at [phone]  Weeknight (5PM-9AM) or  Weekend/Holiday: Check [email or web address] (password " [email or web address]") for General Surgery [facility] coverage  (Please, do not use SecureChat as it is not reliable communication to reach operating surgeons for immediate patient care given surgeries/outpatient duties/clinic/cross-coverage/off post-call which would lead to a delay in care.  Epic staff messaging available for outptient concerns, but may not be answered for 48 hours or more).      05/10/2022  12:29 PM

## 2022-05-10 NOTE — TOC CM/SW Note (Signed)
  Transition of Care [facility]) Screening Note   Patient Details  Name: Glenn Wheeler Date of Birth: 11/21/1955   Transition of Care [facility]) CM/SW Contact:    Amada Jupiter, LCSW Phone Number: 05/10/2022, 1:09 PM    Transition of Care Department [facility]) has reviewed patient and no TOC needs have been identified at this time. We will continue to monitor patient advancement through interdisciplinary progression rounds. If new patient transition needs arise, please place a TOC consult.

## 2022-05-10 NOTE — H&P (Signed)
Reason for Consult:abdominal pain Referring Provider: Jaidon Ellery is an 67 y.o. male.  HPI: 67 yo male with 1 week of intermittent abdominal pain. Pain had been a dull ache for the last week but worse yesterday. It is in the lower abdomen. No nausea, vomiting, or diarrhea. No fevers.  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  Past Medical History:  Diagnosis Date   Chest pain    Colon polyps    2001, 2007   Exertional angina 07/22/2018   History of chicken pox    childhood   Hyperlipidemia    Leishmaniasis 05-2011   Pulmonary embolus     Past Surgical History:  Procedure Laterality Date   CATARACT EXTRACTION, BILATERAL  2009   [facility]   COLONOSCOPY  [id number]   polyps removed both times   CORONARY ULTRASOUND/IVUS N/A 07/23/2018   Procedure: Intravascular Ultrasound/IVUS;  Surgeon: Elder Negus, MD;  Location: [facility] INVASIVE CV LAB;  Service: Cardiovascular;  Laterality: N/A;   LEFT HEART CATH AND CORONARY ANGIOGRAPHY N/A 07/23/2018   Procedure: LEFT HEART CATH AND CORONARY ANGIOGRAPHY;  Surgeon: Elder Negus, MD;  Location: [facility] INVASIVE CV LAB;  Service: Cardiovascular;  Laterality: N/A;   ROTATOR CUFF REPAIR  2007   left rotator cuff[facility]   TENDON REPAIR  2005   right peroneal tendon-[facility]    Family History  Problem Relation Age of Onset   Prostate cancer Father        alive   Clotting disorder Father        blood clots   Hyperlipidemia Mother    Hypertension Mother    Diabetes Mother    Asthma Mother    Stroke Paternal Grandfather    Stroke Maternal Grandfather    Heart attack Neg Hx    Sudden death Neg Hx    Colon cancer Neg Hx     Social History:  reports that he quit smoking about 38 years ago. His smoking use included cigarettes. He has a 7.00 pack-year smoking history. He has never used  smokeless tobacco. He reports that he does not drink alcohol and does not use drugs.  Allergies: No Known Allergies  Medications: I have reviewed the patient's current medications.  Results for orders placed or performed during the hospital encounter of 05/09/22 (from the past 48 hour(s))  CBC with Differential     Status: None   Collection Time: 05/09/22  6:18 PM  Result Value Ref Range   WBC 8.4 4.0 - 10.5 K/uL   RBC 4.74 4.22 - 5.81 MIL/uL   Hemoglobin 14.2 13.0 - 17.0 g/dL   HCT 08.6 57.8 - 46.9 %   MCV 88.0 80.0 - 100.0 fL   MCH 30.0 26.0 - 34.0 pg   MCHC 34.1 30.0 - 36.0 g/dL   RDW 62.9 52.8 - 41.3 %   Platelets 163 150 - 400 K/uL   nRBC 0.0 0.0 - 0.2 %   Neutrophils Relative % 77 %   Neutro Abs 6.5 1.7 - 7.7 K/uL   Lymphocytes Relative 12 %   Lymphs Abs 1.0 0.7 - 4.0 K/uL   Monocytes Relative 9 %   Monocytes Absolute 0.7 0.1 - 1.0 K/uL   Eosinophils Relative 1 %   Eosinophils Absolute 0.1 0.0 - 0.5 K/uL   Basophils Relative 1 %   Basophils Absolute 0.0 0.0 - 0.1 K/uL  Immature Granulocytes 0 %   Abs Immature Granulocytes 0.02 0.00 - 0.07 K/uL    Comment: Performed at [facility] [place], [address] [place], [place] [address]  Comprehensive metabolic panel     Status: Abnormal   Collection Time: 05/09/22  6:18 PM  Result Value Ref Range   Sodium 136 135 - 145 mmol/L   Potassium 4.0 3.5 - 5.1 mmol/L   Chloride 102 98 - 111 mmol/L   CO2 25 22 - 32 mmol/L   Glucose, Bld 115 (H) 70 - 99 mg/dL    Comment: Glucose reference range applies only to samples taken after fasting for at least 8 hours.   BUN 22 8 - 23 mg/dL   Creatinine, Ser 6.041.16 0.61 - 1.24 mg/dL   Calcium 8.3 (L) 8.9 - 10.3 mg/dL   Total Protein 7.0 6.5 - 8.1 g/dL   Albumin 3.7 3.5 - 5.0 g/dL   AST 32 15 - 41 U/L   ALT 29 0 - 44 U/L   Alkaline Phosphatase 81 38 - 126 U/L   Total Bilirubin 1.2 0.3 - 1.2 mg/dL   GFR, Estimated >54>60 >09>60 mL/min    Comment: (NOTE) Calculated using the CKD-EPI  Creatinine Equation (2021)    Anion gap 9 5 - 15    Comment: Performed at [facility] [place], [address] [place], [place] [address]  Lipase, blood     Status: None   Collection Time: 05/09/22  6:18 PM  Result Value Ref Range   Lipase 38 11 - 51 U/L    Comment: Performed at [facility] [place], [address] [place], [place] [address]  Urinalysis, Routine w reflex microscopic -Urine, Clean Catch     Status: Abnormal   Collection Time: 05/09/22  6:18 PM  Result Value Ref Range   Color, Urine YELLOW YELLOW   APPearance CLEAR CLEAR   Specific Gravity, Urine 1.020 1.005 - 1.030   pH 6.0 5.0 - 8.0   Glucose, UA NEGATIVE NEGATIVE mg/dL   Hgb urine dipstick NEGATIVE NEGATIVE   Bilirubin Urine NEGATIVE NEGATIVE   Ketones, ur 15 (A) NEGATIVE mg/dL   Protein, ur NEGATIVE NEGATIVE mg/dL   Nitrite NEGATIVE NEGATIVE   Leukocytes,Ua NEGATIVE NEGATIVE    Comment: Microscopic not done on urines with negative protein, blood, leukocytes, nitrite, or glucose < 500 mg/dL. Performed at [facility] [place], [address] [place], [place] [address]   Protime-INR     Status: Abnormal   Collection Time: 05/09/22  6:18 PM  Result Value Ref Range   Prothrombin Time 31.6 (H) 11.4 - 15.2 seconds   INR 3.1 (H) 0.8 - 1.2    Comment: (NOTE) INR goal varies based on device and disease states. Performed at [facility] [place], [address] [place], [place] [address]   Lactic acid, plasma     Status: None   Collection Time: 05/09/22  9:45 PM  Result Value Ref Range   Lactic Acid, Venous 0.8 0.5 - 1.9 mmol/L    Comment: Performed at [facility] [place], [address] [place], [place] [address]  Basic metabolic panel     Status: Abnormal   Collection Time: 05/10/22  4:38 AM  Result Value Ref Range   Sodium 135 135 - 145 mmol/L   Potassium 3.8 3.5 - 5.1 mmol/L   Chloride 102 98 - 111 mmol/L   CO2 26 22 - 32 mmol/L   Glucose,  Bld 154 (H) 70 - 99 mg/dL    Comment: Glucose  reference range applies only to samples taken after fasting for at least 8 hours.   BUN 20 8 - 23 mg/dL   Creatinine, Ser 3.33 (H) 0.61 - 1.24 mg/dL   Calcium 7.8 (L) 8.9 - 10.3 mg/dL   GFR, Estimated >83 >29 mL/min    Comment: (NOTE) Calculated using the CKD-EPI Creatinine Equation (2021)    Anion gap 7 5 - 15    Comment: Performed at [facility], [address] [place], [place] [address]  CBC     Status: Abnormal   Collection Time: 05/10/22  4:38 AM  Result Value Ref Range   WBC 8.4 4.0 - 10.5 K/uL   RBC 4.43 4.22 - 5.81 MIL/uL   Hemoglobin 13.4 13.0 - 17.0 g/dL   HCT 06.0 04.5 - 99.7 %   MCV 90.5 80.0 - 100.0 fL   MCH 30.2 26.0 - 34.0 pg   MCHC 33.4 30.0 - 36.0 g/dL   RDW 74.1 42.3 - 95.3 %   Platelets 148 (L) 150 - 400 K/uL   nRBC 0.0 0.0 - 0.2 %    Comment: Performed at [facility], [address] [place], [place] [address]  Protime-INR     Status: Abnormal   Collection Time: 05/10/22  6:11 AM  Result Value Ref Range   Prothrombin Time 30.7 (H) 11.4 - 15.2 seconds   INR 3.0 (H) 0.8 - 1.2    Comment: (NOTE) INR goal varies based on device and disease states. Performed at [facility], [address] [place], [place] [address]     CT ABDOMEN PELVIS W CONTRAST  Result Date: 05/09/2022 CLINICAL DATA:  Acute generalized abdominal pain. EXAM: CT ABDOMEN AND PELVIS WITH CONTRAST TECHNIQUE: Multidetector CT imaging of the abdomen and pelvis was performed using the standard protocol following bolus administration of intravenous contrast. RADIATION DOSE REDUCTION: This exam was performed according to the departmental dose-optimization program which includes automated exposure control, adjustment of the mA and/or kV according to patient size and/or use of iterative reconstruction technique. CONTRAST:  OMNIPAQUE IOHEXOL 300 MG/ML  SOLN COMPARISON:  None Available. FINDINGS: Lower chest: No acute abnormality.  Hepatobiliary: No focal liver abnormality is seen. No gallstones, gallbladder wall thickening, or biliary dilatation. Pancreas: Unremarkable. No pancreatic ductal dilatation or surrounding inflammatory changes. Spleen: Normal in size without focal abnormality. Adrenals/Urinary Tract: Adrenal glands are unremarkable. Kidneys are normal, without renal calculi, focal lesion, or hydronephrosis. Bladder is unremarkable. Stomach/Bowel: The stomach is unremarkable. There is no evidence of bowel obstruction. The appendix is severely enlarged at 2 cm with a large amount of surrounding inflammation consistent with acute appendicitis. A portion of the wall of the appendix is not well visualized and perforation cannot be excluded. This is best seen on image number 67 of series 2. Vascular/Lymphatic: No significant vascular findings are present. No enlarged abdominal or pelvic lymph nodes. Reproductive: Mild prostatic enlargement. Other: No abdominal wall hernia or abnormality. No abdominopelvic ascites. Musculoskeletal: No acute or significant osseous findings. IMPRESSION: The appendix is severely enlarged and inflamed concerning for acute appendicitis. There is a portion of the wall of the appendix which is not well visualized suggesting the possibility of perforation. Critical Value/emergent results were called by telephone at the time of interpretation on 05/09/2022 at 8:53 pm to provider Dr. Hart Rochester, who verbally acknowledged these results. Electronically Signed   By: Zenda Alpers.D.  On: 05/09/2022 20:53    Review of Systems  Constitutional: Negative.   HENT: Negative.    Eyes: Negative.   Respiratory: Negative.    Cardiovascular: Negative.   Gastrointestinal:  Positive for abdominal pain.  Genitourinary: Negative.   Musculoskeletal: Negative.   Skin: Negative.   Neurological: Negative.   Endo/Heme/Allergies: Negative.   Psychiatric/Behavioral: Negative.      PE Blood pressure 106/66, pulse 82,  temperature 99.4 F (37.4 C), temperature source Oral, resp. rate 16, height 6' (1.829 m), weight 105.7 kg, SpO2 100 %. Constitutional: NAD; conversant; no deformities Eyes: Moist conjunctiva; no lid lag; anicteric; PERRL Neck: Trachea midline; no thyromegaly Lungs: Normal respiratory effort; no tactile fremitus CV: RRR; no palpable thrills; no pitting edema GI: Abd slight pain on palpation RLQ; no palpable hepatosplenomegaly MSK: Normal gait; no clubbing/cyanosis Psychiatric: Appropriate affect; alert and oriented x3 Lymphatic: No palpable cervical or axillary lymphadenopathy Skin: No major subcutaneous nodules. Warm and dry   Assessment/Plan: 67 yo male with acute appendicitis with possible perforation -vit K today, recheck INR was 3.0 from 3.1 -IV abx -NPO -plan to wait 24 h and likely surgery tomorrow -we discussed the details of the procedure; that it would be done under general anesthesia, that we would attempt to do the procedure laparoscopically. That the appendix would be isolated from the large and small intestine and then ligated and removed. We discussed the reason for this is to avoid rupture and infection and resolve the pains. We discussed risks of infection, abscess, injury to intestines or urinary structures, and need for open incision. He showed good understanding and wanted to proceed.   I reviewed last 24 h vitals and pain scores, last 48 h intake and output, last 24 h labs and trends, and last 24 h imaging results.  This care required high  level of medical decision making.   De Blanch Cayce Paschal 05/10/2022, 7:21 AM

## 2022-05-10 NOTE — Progress Notes (Signed)
ANTICOAGULATION CONSULT NOTE - Initial Consult  Pharmacy Consult for IV heparin to use periop as patient on warfarin PTA Indication: pulmonary embolus and DVT  No Known Allergies  Patient Measurements: Height: 6' (182.9 cm) Weight: 105.7 kg (233 lb) IBW/kg (Calculated) : 77.6 Heparin Dosing Weight: 99.6 kg  Vital Signs: Temp: 99.4 F (37.4 C) (04/10 0451) Temp Source: Oral (04/10 0451) BP: 106/66 (04/10 0451) Pulse Rate: 82 (04/10 0451)  Labs: Recent Labs    05/09/22 1818 05/10/22 0438 05/10/22 0611  HGB 14.2 13.4  --   HCT 41.7 40.1  --   PLT 163 148*  --   LABPROT 31.6*  --  30.7*  INR 3.1*  --  3.0*  CREATININE 1.16 1.26*  --     Estimated Creatinine Clearance: 70.5 mL/min (A) (by C-G formula based on SCr of 1.26 mg/dL (H)).   Medical History: Past Medical History:  Diagnosis Date   Chest pain    Colon polyps    2001, 2007   Exertional angina 07/22/2018   History of chicken pox    childhood   Hyperlipidemia    Leishmaniasis 05-2011   Pulmonary embolus     Medications:  Scheduled:   metoprolol succinate  25 mg Oral QHS   Infusions:   dextrose 5 % and 0.45% NaCl Stopped (05/10/22 0502)   piperacillin-tazobactam (ZOSYN)  IV 12.5 mL/hr at 05/10/22 0539    Assessment: 67 yo with acute appendicitis with possible perforation with surgery service planning surgery on 4/11. Patient on warfarin prior to admission for multiple DVTs and PE with INR being 3 this AM. Dr. Myna Hidalgo ordered vitamin K (7.5mg  total given today) and wants to start IV heparin with no bolus this AM and not wait on INR to decrease to below 2. Hgb 13.4, stable. Plts 148 down slightly. No reported bleeding  Goal of Therapy:  Heparin level 0.3-0.7 units/ml Monitor platelets by anticoagulation protocol: Yes   Plan:  NO IV heparin bolus  Start IV heparin at rate of 1600 units/hr Will check heparin level 8 hours after start of IV heparin Daily CBC and heparin level Will await on surgery  orders as to when heparin needs to be held morning of surgery   Hessie Knows, PharmD, BCPS Secure Chat if ?s 05/10/2022 8:22 AM

## 2022-05-10 NOTE — Plan of Care (Signed)

## 2022-05-11 ENCOUNTER — Inpatient Hospital Stay (HOSPITAL_COMMUNITY): Payer: Medicare Other | Admitting: Certified Registered"

## 2022-05-11 ENCOUNTER — Inpatient Hospital Stay (HOSPITAL_COMMUNITY): Payer: Medicare Other

## 2022-05-11 ENCOUNTER — Other Ambulatory Visit: Payer: Self-pay

## 2022-05-11 ENCOUNTER — Encounter (HOSPITAL_COMMUNITY): Payer: Self-pay

## 2022-05-11 ENCOUNTER — Encounter (HOSPITAL_COMMUNITY): Admission: EM | Disposition: A | Discharge status: Home / Self Care | Payer: Self-pay | Source: Home / Self Care

## 2022-05-11 DIAGNOSIS — I82559 Chronic embolism and thrombosis of unspecified peroneal vein: Secondary | ICD-10-CM

## 2022-05-11 DIAGNOSIS — K3532 Acute appendicitis with perforation and localized peritonitis, without abscess: Secondary | ICD-10-CM | POA: Diagnosis not present

## 2022-05-11 DIAGNOSIS — R109 Unspecified abdominal pain: Secondary | ICD-10-CM

## 2022-05-11 DIAGNOSIS — R509 Fever, unspecified: Secondary | ICD-10-CM

## 2022-05-11 DIAGNOSIS — Z7901 Long term (current) use of anticoagulants: Secondary | ICD-10-CM

## 2022-05-11 HISTORY — PX: LAPAROSCOPIC APPENDECTOMY: SHX408

## 2022-05-11 LAB — TYPE AND SCREEN
ABO/RH(D): O POS
Antibody Screen: NEGATIVE

## 2022-05-11 LAB — CBC
HCT: 37.6 % — ABNORMAL LOW (ref 39.0–52.0)
Hemoglobin: 12.6 g/dL — ABNORMAL LOW (ref 13.0–17.0)
MCH: 30.2 pg (ref 26.0–34.0)
MCHC: 33.5 g/dL (ref 30.0–36.0)
MCV: 90.2 fL (ref 80.0–100.0)
Platelets: 113 10*3/uL — ABNORMAL LOW (ref 150–400)
RBC: 4.17 MIL/uL — ABNORMAL LOW (ref 4.22–5.81)
RDW: 13.4 % (ref 11.5–15.5)
WBC: 9.2 10*3/uL (ref 4.0–10.5)
nRBC: 0 % (ref 0.0–0.2)

## 2022-05-11 LAB — BPAM FFP
Blood Product Expiration Date: 202404162359
Blood Product Expiration Date: 202404162359

## 2022-05-11 LAB — PROTIME-INR
INR: 2.1 — ABNORMAL HIGH (ref 0.8–1.2)
Prothrombin Time: 23 seconds — ABNORMAL HIGH (ref 11.4–15.2)

## 2022-05-11 LAB — MISC LABCORP TEST (SEND OUT): Labcorp test code: 83935

## 2022-05-11 LAB — PREPARE FRESH FROZEN PLASMA

## 2022-05-11 LAB — ABO/RH: ABO/RH(D): O POS

## 2022-05-11 SURGERY — APPENDECTOMY, LAPAROSCOPIC
Anesthesia: General

## 2022-05-11 MED ORDER — SODIUM CHLORIDE 0.9% IV SOLUTION: Freq: Once | INTRAVENOUS | Status: DC

## 2022-05-11 MED ORDER — PHENYLEPHRINE HCL-NACL 20-0.9 MG/250ML-% IV SOLN
INTRAVENOUS | Status: DC | PRN
  Administered 2022-05-11: 25 ug/min via INTRAVENOUS

## 2022-05-11 MED ORDER — MIDAZOLAM HCL 5 MG/5ML IJ SOLN
INTRAMUSCULAR | Status: DC | PRN
  Administered 2022-05-11: 2 mg via INTRAVENOUS

## 2022-05-11 MED ORDER — ARTIFICIAL TEARS OPHTHALMIC OINT
TOPICAL_OINTMENT | OPHTHALMIC | Status: AC
  Filled 2022-05-11: qty 3.5

## 2022-05-11 MED ORDER — MENTHOL 3 MG MT LOZG: 1.0000 | LOZENGE | OROMUCOSAL | Status: DC | PRN

## 2022-05-11 MED ORDER — METHOCARBAMOL 1000 MG/10ML IJ SOLN: 1000.0000 mg | Freq: Four times a day (QID) | INTRAVENOUS | Status: DC | PRN

## 2022-05-11 MED ORDER — LACTATED RINGERS IR SOLN
Status: DC | PRN
  Administered 2022-05-11 (×3): 1000 mL

## 2022-05-11 MED ORDER — CHLORHEXIDINE GLUCONATE 0.12 % MT SOLN
15.0000 mL | Freq: Once | OROMUCOSAL | Status: AC
  Administered 2022-05-11: 15 mL via OROMUCOSAL

## 2022-05-11 MED ORDER — LACTATED RINGERS IV SOLN: INTRAVENOUS | Status: DC

## 2022-05-11 MED ORDER — ROCURONIUM BROMIDE 10 MG/ML (PF) SYRINGE
PREFILLED_SYRINGE | INTRAVENOUS | Status: AC
  Filled 2022-05-11: qty 10

## 2022-05-11 MED ORDER — DEXAMETHASONE SODIUM PHOSPHATE 10 MG/ML IJ SOLN
INTRAMUSCULAR | Status: DC | PRN
  Administered 2022-05-11: 8 mg via INTRAVENOUS

## 2022-05-11 MED ORDER — EPHEDRINE 5 MG/ML INJ
INTRAVENOUS | Status: AC
  Filled 2022-05-11: qty 5

## 2022-05-11 MED ORDER — PROPOFOL 10 MG/ML IV BOLUS
INTRAVENOUS | Status: AC
  Filled 2022-05-11: qty 20

## 2022-05-11 MED ORDER — PROPOFOL 10 MG/ML IV BOLUS
INTRAVENOUS | Status: DC | PRN
  Administered 2022-05-11: 120 mg via INTRAVENOUS

## 2022-05-11 MED ORDER — DEXAMETHASONE SODIUM PHOSPHATE 10 MG/ML IJ SOLN
INTRAMUSCULAR | Status: AC
  Filled 2022-05-11: qty 1

## 2022-05-11 MED ORDER — FENTANYL CITRATE (PF) 100 MCG/2ML IJ SOLN
INTRAMUSCULAR | Status: AC
  Filled 2022-05-11: qty 2

## 2022-05-11 MED ORDER — LIDOCAINE 2% (20 MG/ML) 5 ML SYRINGE
INTRAMUSCULAR | Status: DC | PRN
  Administered 2022-05-11: 60 mg via INTRAVENOUS

## 2022-05-11 MED ORDER — SUCCINYLCHOLINE CHLORIDE 200 MG/10ML IV SOSY
PREFILLED_SYRINGE | INTRAVENOUS | Status: AC
  Filled 2022-05-11: qty 10

## 2022-05-11 MED ORDER — ALUM & MAG HYDROXIDE-SIMETH 200-200-20 MG/5ML PO SUSP: 30.0000 mL | Freq: Four times a day (QID) | ORAL | Status: DC | PRN

## 2022-05-11 MED ORDER — METRONIDAZOLE 500 MG/100ML IV SOLN
INTRAVENOUS | Status: AC
  Filled 2022-05-11: qty 100

## 2022-05-11 MED ORDER — ROCURONIUM BROMIDE 10 MG/ML (PF) SYRINGE
PREFILLED_SYRINGE | INTRAVENOUS | Status: DC | PRN
  Administered 2022-05-11: 50 mg via INTRAVENOUS

## 2022-05-11 MED ORDER — FENTANYL CITRATE (PF) 100 MCG/2ML IJ SOLN
INTRAMUSCULAR | Status: DC | PRN
  Administered 2022-05-11 (×2): 50 ug via INTRAVENOUS

## 2022-05-11 MED ORDER — HYDROMORPHONE HCL 1 MG/ML IJ SOLN
0.5000 mg | INTRAMUSCULAR | Status: DC | PRN
  Administered 2022-05-11 – 2022-05-12 (×2): 1 mg via INTRAVENOUS
  Filled 2022-05-11 (×2): qty 1

## 2022-05-11 MED ORDER — LIP MEDEX EX OINT
TOPICAL_OINTMENT | Freq: Two times a day (BID) | CUTANEOUS | Status: DC
  Administered 2022-05-11 – 2022-05-13 (×2): 75 via TOPICAL
  Administered 2022-05-14: 1 via TOPICAL
  Administered 2022-05-15: 75 via TOPICAL
  Filled 2022-05-11 (×4): qty 7

## 2022-05-11 MED ORDER — BUPIVACAINE-EPINEPHRINE (PF) 0.5% -1:200000 IJ SOLN
INTRAMUSCULAR | Status: AC
  Filled 2022-05-11: qty 30

## 2022-05-11 MED ORDER — ONDANSETRON HCL 4 MG/2ML IJ SOLN
INTRAMUSCULAR | Status: AC
  Filled 2022-05-11: qty 2

## 2022-05-11 MED ORDER — MAGIC MOUTHWASH: 15.0000 mL | Freq: Four times a day (QID) | ORAL | Status: DC | PRN

## 2022-05-11 MED ORDER — SUGAMMADEX SODIUM 200 MG/2ML IV SOLN
INTRAVENOUS | Status: DC | PRN
  Administered 2022-05-11: 200 mg via INTRAVENOUS

## 2022-05-11 MED ORDER — BUPIVACAINE-EPINEPHRINE (PF) 0.5% -1:200000 IJ SOLN
INTRAMUSCULAR | Status: DC | PRN
  Administered 2022-05-11: 50 mL

## 2022-05-11 MED ORDER — SUCCINYLCHOLINE CHLORIDE 200 MG/10ML IV SOSY
PREFILLED_SYRINGE | INTRAVENOUS | Status: DC | PRN
  Administered 2022-05-11: 100 mg via INTRAVENOUS

## 2022-05-11 MED ORDER — HYDROMORPHONE HCL 1 MG/ML IJ SOLN
0.2500 mg | INTRAMUSCULAR | Status: DC | PRN
  Administered 2022-05-11: 0.5 mg via INTRAVENOUS

## 2022-05-11 MED ORDER — PHYTONADIONE 5 MG PO TABS
10.0000 mg | ORAL_TABLET | Freq: Once | ORAL | Status: AC
  Administered 2022-05-11: 10 mg via ORAL
  Filled 2022-05-11: qty 2

## 2022-05-11 MED ORDER — ONDANSETRON HCL 4 MG/2ML IJ SOLN
INTRAMUSCULAR | Status: DC | PRN
  Administered 2022-05-11: 4 mg via INTRAVENOUS

## 2022-05-11 MED ORDER — MIDAZOLAM HCL 2 MG/2ML IJ SOLN
INTRAMUSCULAR | Status: AC
  Filled 2022-05-11: qty 2

## 2022-05-11 MED ORDER — HYDROMORPHONE HCL 1 MG/ML IJ SOLN
INTRAMUSCULAR | Status: AC
  Administered 2022-05-11: 0.5 mg via INTRAVENOUS
  Filled 2022-05-11: qty 1

## 2022-05-11 MED ORDER — LACTATED RINGERS IV BOLUS: 1000.0000 mL | Freq: Three times a day (TID) | INTRAVENOUS | Status: AC | PRN

## 2022-05-11 MED ORDER — EPHEDRINE SULFATE-NACL 50-0.9 MG/10ML-% IV SOSY
PREFILLED_SYRINGE | INTRAVENOUS | Status: DC | PRN
  Administered 2022-05-11 (×3): 5 mg via INTRAVENOUS

## 2022-05-11 MED ORDER — 0.9 % SODIUM CHLORIDE (POUR BTL) OPTIME
TOPICAL | Status: DC | PRN
  Administered 2022-05-11: 1000 mL

## 2022-05-11 MED ORDER — PHENOL 1.4 % MT LIQD: 2.0000 | OROMUCOSAL | Status: DC | PRN

## 2022-05-11 MED ORDER — BISACODYL 10 MG RE SUPP: 10.0000 mg | Freq: Two times a day (BID) | RECTAL | Status: DC | PRN

## 2022-05-11 MED ORDER — BUPIVACAINE LIPOSOME 1.3 % IJ SUSP
INTRAMUSCULAR | Status: AC
  Filled 2022-05-11: qty 20

## 2022-05-11 SURGICAL SUPPLY — 65 items
APL PRP STRL LF DISP 70% ISPRP (MISCELLANEOUS) ×1
APPLIER CLIP 5 13 M/L LIGAMAX5 (MISCELLANEOUS)
APPLIER CLIP ROT 10 11.4 M/L (STAPLE)
APR CLP MED LRG 11.4X10 (STAPLE)
APR CLP MED LRG 5 ANG JAW (MISCELLANEOUS)
BAG COUNTER SPONGE SURGICOUNT (BAG) IMPLANT
BAG SPEC RTRVL 10 TROC 200 (ENDOMECHANICALS) ×1
BAG SPNG CNTER NS LX DISP (BAG)
CABLE HIGH FREQUENCY MONO STRZ (ELECTRODE) ×1 IMPLANT
CHLORAPREP W/TINT 26 (MISCELLANEOUS) ×2 IMPLANT
CLIP APPLIE 5 13 M/L LIGAMAX5 (MISCELLANEOUS) IMPLANT
CLIP APPLIE ROT 10 11.4 M/L (STAPLE) IMPLANT
COVER SURGICAL LIGHT HANDLE (MISCELLANEOUS) ×2 IMPLANT
DEVICE TROCAR PUNCTURE CLOSURE (ENDOMECHANICALS) IMPLANT
DRAIN CHANNEL 19F RND (DRAIN) IMPLANT
DRAPE LAPAROSCOPIC ABDOMINAL (DRAPES) ×1 IMPLANT
DRAPE WARM FLUID 44X44 (DRAPES) ×2 IMPLANT
DRSG TEGADERM 2-3/8X2-3/4 SM (GAUZE/BANDAGES/DRESSINGS) ×4 IMPLANT
DRSG TEGADERM 4X4.75 (GAUZE/BANDAGES/DRESSINGS) ×1 IMPLANT
ELECT REM PT RETURN 15FT ADLT (MISCELLANEOUS) ×2 IMPLANT
ENDOLOOP SUT PDS II  0 18 (SUTURE)
ENDOLOOP SUT PDS II 0 18 (SUTURE) IMPLANT
EVACUATOR DRAINAGE 10X20 100CC (DRAIN) IMPLANT
EVACUATOR SILICONE 100CC (DRAIN) ×1
GAUZE SPONGE 2X2 8PLY STRL LF (GAUZE/BANDAGES/DRESSINGS) ×2 IMPLANT
GLOVE ECLIPSE 8.0 STRL XLNG CF (GLOVE) ×1 IMPLANT
GLOVE INDICATOR 8.0 STRL GRN (GLOVE) ×1 IMPLANT
GOWN STRL REUS W/ TWL XL LVL3 (GOWN DISPOSABLE) ×1 IMPLANT
GOWN STRL REUS W/TWL XL LVL3 (GOWN DISPOSABLE) ×1
IRRIG SUCT STRYKERFLOW 2 WTIP (MISCELLANEOUS) ×1
IRRIGATION SUCT STRKRFLW 2 WTP (MISCELLANEOUS) ×1 IMPLANT
KIT BASIN OR (CUSTOM PROCEDURE TRAY) ×1 IMPLANT
KIT TURNOVER KIT A (KITS) IMPLANT
NDL INSUFFLATION 14GA 120MM (NEEDLE) ×2 IMPLANT
NEEDLE INSUFFLATION 14GA 120MM (NEEDLE) ×1 IMPLANT
PAD POSITIONING PINK XL (MISCELLANEOUS) ×1 IMPLANT
PENCIL SMOKE EVACUATOR (MISCELLANEOUS) IMPLANT
POUCH RETRIEVAL ECOSAC 10 (ENDOMECHANICALS) ×1 IMPLANT
POUCH RETRIEVAL ECOSAC 10MM (ENDOMECHANICALS) ×1
RELOAD STAPLE 60 3.6 BLU REG (STAPLE) IMPLANT
RELOAD STAPLE 60 4.1 GRN THCK (STAPLE) IMPLANT
RELOAD STAPLER BLUE 60MM (STAPLE) IMPLANT
RELOAD STAPLER GREEN 60MM (STAPLE) ×1 IMPLANT
SCISSORS LAP 5X35 DISP (ENDOMECHANICALS) ×1 IMPLANT
SEALER TISSUE G2 STRG ARTC 35C (ENDOMECHANICALS) IMPLANT
SET TUBE SMOKE EVAC HIGH FLOW (TUBING) ×1 IMPLANT
SHEARS HARMONIC ACE PLUS 36CM (ENDOMECHANICALS) ×1 IMPLANT
SLEEVE Z-THREAD 5X100MM (TROCAR) IMPLANT
SPIKE FLUID TRANSFER (MISCELLANEOUS) ×1 IMPLANT
STAPLE ECHEON FLEX 60 POW ENDO (STAPLE) IMPLANT
STAPLER RELOAD BLUE 60MM (STAPLE)
STAPLER RELOAD GREEN 60MM (STAPLE) ×1
SUT MNCRL AB 4-0 PS2 18 (SUTURE) ×2 IMPLANT
SUT PDS AB 0 CT1 36 (SUTURE) IMPLANT
SUT PDS AB 1 CT1 27 (SUTURE) ×2 IMPLANT
SUT PROLENE 2 0 SH DA (SUTURE) IMPLANT
SUT SILK 2 0 SH (SUTURE) IMPLANT
TOWEL OR 17X26 10 PK STRL BLUE (TOWEL DISPOSABLE) ×1 IMPLANT
TRAY FOLEY MTR SLVR 14FR STAT (SET/KITS/TRAYS/PACK) ×2 IMPLANT
TRAY FOLEY MTR SLVR 16FR STAT (SET/KITS/TRAYS/PACK) IMPLANT
TRAY LAPAROSCOPIC (CUSTOM PROCEDURE TRAY) ×1 IMPLANT
TROCAR ADV FIXATION 12X100MM (TROCAR) ×2 IMPLANT
TROCAR ADV FIXATION 5X100MM (TROCAR) ×2 IMPLANT
TROCAR Z THREAD OPTICAL 12X100 (TROCAR) IMPLANT
TROCAR Z-THREAD OPTICAL 5X100M (TROCAR) ×1 IMPLANT

## 2022-05-11 NOTE — Anesthesia Preprocedure Evaluation (Addendum)
Anesthesia Evaluation  Patient identified by MRN, date of birth, ID band Patient awake    Reviewed: Allergy & Precautions, H&P , NPO status , Patient's Chart, lab work & pertinent test results, reviewed documented beta blocker date and time   Airway Mallampati: III  TM Distance: >3 FB Neck ROM: Full    Dental no notable dental hx. (+) Teeth Intact, Dental Advisory Given   Pulmonary former smoker, PE   Pulmonary exam normal breath sounds clear to auscultation       Cardiovascular + Peripheral Vascular Disease   Rhythm:Regular Rate:Normal     Neuro/Psych negative neurological ROS  negative psych ROS   GI/Hepatic negative GI ROS, Neg liver ROS,,,  Endo/Other  negative endocrine ROS    Renal/GU negative Renal ROS  negative genitourinary   Musculoskeletal  (+) Arthritis , Osteoarthritis,    Abdominal   Peds  Hematology negative hematology ROS (+)   Anesthesia Other Findings   Reproductive/Obstetrics negative OB ROS                             Anesthesia Physical Anesthesia Plan  ASA: 2 and emergent  Anesthesia Plan: General   Post-op Pain Management: Tylenol PO (pre-op)*   Induction: Intravenous  PONV Risk Score and Plan: 3 and Ondansetron, Dexamethasone and Treatment may vary due to age or medical condition  Airway Management Planned: Oral ETT  Additional Equipment:   Intra-op Plan:   Post-operative Plan: Extubation in OR  Informed Consent: I have reviewed the patients History and Physical, chart, labs and discussed the procedure including the risks, benefits and alternatives for the proposed anesthesia with the patient or authorized representative who has indicated his/her understanding and acceptance.     Dental advisory given  Plan Discussed with: CRNA  Anesthesia Plan Comments:        Anesthesia Quick Evaluation

## 2022-05-11 NOTE — Transfer of Care (Signed)
Immediate Anesthesia Transfer of Care Note  Patient: Glenn Wheeler  Procedure(s) Performed: APPENDECTOMY LAPAROSCOPIC  Patient Location: PACU  Anesthesia Type:General  Level of Consciousness: awake, alert , oriented, and patient cooperative  Airway & Oxygen Therapy: Patient Spontanous Breathing and Patient connected to face mask oxygen  Post-op Assessment: Report given to RN, Post -op Vital signs reviewed and stable, and Patient moving all extremities  Post vital signs: Reviewed and stable  Last Vitals:  Vitals Value Taken Time  BP 129/63 05/11/22 1141  Temp    Pulse 83 05/11/22 1145  Resp 20 05/11/22 1145  SpO2 100 % 05/11/22 1145  Vitals shown include unvalidated device data.  Last Pain:  Vitals:   05/11/22 0937  TempSrc: Oral  PainSc:       Patients Stated Pain Goal: 0 (05/11/22 0903)  Complications: No notable events documented.

## 2022-05-11 NOTE — Op Note (Addendum)
PATIENT:  Glenn Wheeler  67 y.o. male  Patient Care Team: Adolph Pollack, FNP as PCP - General (Family Medicine)  PRE-OPERATIVE DIAGNOSIS:  ACUTE APPENDICITIS  POST-OPERATIVE DIAGNOSIS:  ACUTE PERFORATED APPENDICITIS WITH ABSCESS & PERITONITIS  PROCEDURE:   LAPAROSCOPIC APPENDECTOMY  LAPAROSCOPIC DRAINAGE OF ABSCESS  SURGEON:  Ardeth Sportsman, MD  ASSISTANT:  OR Staff  ANESTHESIA:  General endotracheal intubation anesthesia (GETA) and Local & regional field block at incision(s) for perioperative & postoperative pain control provided with liposomal bupivacaine (Experel) 94mL mixed with 82mL of bupivicaine 0.25%  Estimated Blood Loss (EBL):   Total I/O In: 1250 [I.V.:1000; Blood:50; IV Piggyback:200] Out: 150 [Urine:125; Blood:25].   (See anesthesia record)  Delay start of Pharmacological VTE agent (>24hrs) due to concerns of significant anemia, surgical blood loss, or risk of bleeding?:  Hold until POD#1 at least  DRAINS: 19 Fr Blake drain RLQ retrocecal w the tip resting in the pelvis  SPECIMEN:  Appendix (two pieces) and abscess cavity  DISPOSITION OF SPECIMEN:  Pathology  COUNTS:  Sponge, needle, & instrument counts CORRECT  PLAN OF CARE: Admit to inpatient   PATIENT DISPOSITION:  PACU - hemodynamically stable.   INDICATIONS: Patient with concerning symptoms & work up suspicious for appendicitis.  Plan: On warfarin.  Bowel rest with IV antibiotics.  Vitamin K given.  Patient with worsening pain and fever.  More aggressive reversal recommended with FFP 1 unit given just perioperatively.  Heparin drip held 7 hours preop.  Surgery was recommended:  The anatomy & physiology of the digestive tract was discussed.  The pathophysiology of appendicitis was discussed.  Natural history risks without surgery was discussed.   I feel the risks of no intervention will lead to serious problems that outweigh the operative risks; therefore, I recommended diagnostic laparoscopy with  removal of appendix to remove the pathology.  Laparoscopic & open techniques were discussed.   I noted a good likelihood this will help address the problem.    Risks such as bleeding, infection, abscess, leak, reoperation, possible ostomy, hernia, heart attack, death, and other risks were discussed.  Goals of post-operative recovery were discussed as well.  We will work to minimize complications.  Questions were answered.  The patient expresses understanding & wishes to proceed with surgery.  OR FINDINGS: Ischemic perforated appendix retrocecal with retrocecal abscess.  Chronic retroperitoneal abscess cavity and planes between abscess cavity/appendix/ileocecal mesentery rather obliterated.  Dilated small bowel concerning for early partial bowel obstruction as a result.  CASE DATA:  Type of patient?: LDOW CASE (Surgical Hospitalist [facility] Inpatient)  Status of Case? URGENT Add On  Infection Present At Time Of Surgery (PATOS)?  ABSCESS  DESCRIPTION:   The patient was identified & brought into the operating room. The patient was positioned supine with arms tucked. SCDs were active during the entire case. The patient underwent general anesthesia without any difficulty.  The abdomen was prepped and draped in a sterile fashion. A Surgical Timeout confirmed our plan.  I made a transverse incision through the superior umbilical fold.  I made a small transverse nick through the infraumbilical fascia and confirmed peritoneal entry.  I placed a 10mm port.  We induced carbon dioxide insufflation.  Camera inspection revealed no injury.  I placed additional ports under direct laparoscopic visualization.  Upon entering the abdomen (organ space), I encountered a phlegmon involving the appendix .  I mobilized the terminal ileum to proximal ascending colon in a lateral to medial fashion.  Planes were rather  obliterated.  End up encountering draining a purulent abscess cavity.  I came more proximally on the distal ileal  mesentery and freed off the inferior central retroperitoneum and came more laterally.  Came through the abscess cavity rind hedging into the terminal ileal mesentery and cecal mesentery.  Got into some bleeding and oozing that was able to control with ultrasonic dissection.  I took care to avoid injuring any retroperitoneal structures.    I freed the appendix off its attachments to the ascending colon and cecal mesentery.  It fractured in half.  I skeletonized the proximal half appendiceal stump off the terminal ileal mesentery and cecum.  Transected through the mesoappendix.  I was able to free off the base of the appendix which was still viable.  Did meticulous inspection to confirm no evidence of any serosal injury on the ileum or cecum.  They were both inflamed but the base of the cecum was viable and not particularly thickened.  Therefore I stapled the appendix along with a 2 cm healthy viable cuff of cecum using a laparoscopic stapler.   I did meticulous irrigation of several liters and excised some necrotic abscess cavity wall off of the right lateral retroperitoneum and soluble.  Found a healthy noninflamed fat plane in the retroperitoneum.  Stayed superficial to the right ureter and gonadal vessels.  Inspected the bowel more time.  Copes irrigation ensured good hemostasis in the ileum and cecum and noted it was viable.  I placed the appendix inside an EcoSac bag and removed out the 12mm stapler port.  I did copious irrigation. Hemostasis was good in the mesoappendix, colon mesentery, and retroperitoneum. Staple line was intact on the cecum with no bleeding. I washed out the pelvis, retrohepatic space and right paracolic gutter. I washed out the left side as well.  Hemostasis is good.  The abscess cavity was significant oozing in the patient's chronically anticoagulated aside to place a drain through the left lower quadrant from a port site and directed along the right mid paracolic gutter over the right  lower quadrant retroperitoneum with the tip in the pelvis.  Allowed the ileocecal region flopped back down to have the retromesenteric inflammation/former abscess cavity be drained.  I closed the 12 mm stapler port site fascia with #1 PDS suture using a suture passer under direct laparoscopic visualization.  There was a rectus bleeder that was controlled with a third #1 PDS suture.  Copious irrigation and aggressive injection around region for local block.  Did not irrigation of saline confirmed decent hemostasis on the retroperitoneum and abdominal wall.  I aspirated the carbon dioxide. Ports removed.  Drain secured with 2-0 Prolene suture.  I closed skin using 4-0 monocryl stitch.  Sterile dressings applied.  Recommend we switch to nasogastric tube since high risk for ileus.  Also ordered another unit FFP given the ooziness and inflammation for perioperative hemostasis.  Hold aggressive anticoagulation today.  INR was a bit of 2 preoperatively anyway.  Patient was extubated and sent to the recovery room.   I suspect the patient is going used in the hospital at least overnight and will need antibiotics for 5 days. I discussed operative findings, updated the patient's status, discussed probable steps to recovery, and gave postoperative recommendations to the patient's spouse, Gardiner Barefootaula Salek.    Recommendations were made.  Questions were answered.  She expressed understanding & appreciation.    Ardeth SportsmanSteven C. Niley Helbig, M.D., F.A.C.S. Gastrointestinal and Minimally Invasive Surgery [facility] [address]  [address] [place], [place] [address] [phone] Main / Paging  05/11/2022 11:36 AM

## 2022-05-11 NOTE — Discharge Instructions (Signed)
SURGERY: POST OP INSTRUCTIONS (Surgery for small bowel obstruction, colon resection, etc)   ######################################################################  EAT Gradually transition to a high fiber diet with a fiber supplement over the next few days after discharge  WALK Walk an hour a day.  Control your pain to do that.    CONTROL PAIN Control pain so that you can walk, sleep, tolerate sneezing/coughing, go up/down stairs.  HAVE A BOWEL MOVEMENT DAILY Keep your bowels regular to avoid problems.  OK to try a laxative to override constipation.  OK to use an antidairrheal to slow down diarrhea.  Call if not better after 2 tries  CALL IF YOU HAVE PROBLEMS/CONCERNS Call if you are still struggling despite following these instructions. Call if you have concerns not answered by these instructions  ######################################################################   DIET Follow a light diet the first few days at home.  Start with a bland diet such as soups, liquids, starchy foods, low fat foods, etc.  If you feel full, bloated, or constipated, stay on a ful liquid or pureed/blenderized diet for a few days until you feel better and no longer constipated. Be sure to drink plenty of fluids every day to avoid getting dehydrated (feeling dizzy, not urinating, etc.). Gradually add a fiber supplement to your diet over the next week.  Gradually get back to a regular solid diet.  Avoid fast food or heavy meals the first week as you are more likely to get nauseated. It is expected for your digestive tract to need a few months to get back to normal.  It is common for your bowel movements and stools to be irregular.  You will have occasional bloating and cramping that should eventually fade away.  Until you are eating solid food normally, off all pain medications, and back to regular activities; your bowels will not be normal. Focus on eating a low-fat, high fiber diet the rest of your life  (See Getting to Good Bowel Health, below).  CARE of your INCISION or WOUND  It is good for closed incisions and even open wounds to be washed every day.  Shower every day.  Short baths are fine.  Wash the incisions and wounds clean with soap & water.    You may leave closed incisions open to air if it is dry.   You may cover the incision with clean gauze & replace it after your daily shower for comfort.  TEGADERM:  You have clear gauze band-aid dressings over your closed incision(s).  Remove the dressings 3 days after surgery.= 4/14/ Sunday    If you have an open wound with a wound vac, see wound vac care instructions.    ACTIVITIES as tolerated Start light daily activities --- self-care, walking, climbing stairs-- beginning the day after surgery.  Gradually increase activities as tolerated.  Control your pain to be active.  Stop when you are tired.  Ideally, walk several times a day, eventually an hour a day.   Most people are back to most day-to-day activities in a few weeks.  It takes 4-8 weeks to get back to unrestricted, intense activity. If you can walk 30 minutes without difficulty, it is safe to try more intense activity such as jogging, treadmill, bicycling, low-impact aerobics, swimming, etc. Save the most intensive and strenuous activity for last (Usually 4-8 weeks after surgery) such as sit-ups, heavy lifting, contact sports, etc.  Refrain from any intense heavy lifting or straining until you are off narcotics for pain control.  You will have off  days, but things should improve week-by-week. DO NOT PUSH THROUGH PAIN.  Let pain be your guide: If it hurts to do something, don't do it.  Pain is your body warning you to avoid that activity for another week until the pain goes down. You may drive when you are no longer taking narcotic prescription pain medication, you can comfortably wear a seatbelt, and you can safely make sudden turns/stops to protect yourself without hesitating due to  pain. You may have sexual intercourse when it is comfortable. If it hurts to do something, stop.  MEDICATIONS Take your usually prescribed home medications unless otherwise directed.   Blood thinners:  Usually you can restart any strong blood thinners after the second postoperative day.  It is OK to take aspirin right away.     If you are on strong blood thinners (warfarin/Coumadin, Plavix, Xerelto, Eliquis, Pradaxa, etc), discuss with your surgeon, medicine PCP, and/or cardiologist for instructions on when to restart the blood thinner & if blood monitoring is needed (PT/INR blood check, etc).     PAIN CONTROL Pain after surgery or related to activity is often due to strain/injury to muscle, tendon, nerves and/or incisions.  This pain is usually short-term and will improve in a few months.  To help speed the process of healing and to get back to regular activity more quickly, DO THE FOLLOWING THINGS TOGETHER: Increase activity gradually.  DO NOT PUSH THROUGH PAIN Use Ice and/or Heat Try Gentle Massage and/or Stretching Take over the counter pain medication Take Narcotic prescription pain medication for more severe pain  Good pain control = faster recovery.  It is better to take more medicine to be more active than to stay in bed all day to avoid medications.  Increase activity gradually Avoid heavy lifting at first, then increase to lifting as tolerated over the next 6 weeks. Do not "push through" the pain.  Listen to your body and avoid positions and maneuvers than reproduce the pain.  Wait a few days before trying something more intense Walking an hour a day is encouraged to help your body recover faster and more safely.  Start slowly and stop when getting sore.  If you can walk 30 minutes without stopping or pain, you can try more intense activity (running, jogging, aerobics, cycling, swimming, treadmill, sex, sports, weightlifting, etc.) Remember: If it hurts to do it, then don't do  it! Use Ice and/or Heat You will have swelling and bruising around the incisions.  This will take several weeks to resolve. Ice packs or heating pads (6-8 times a day, 30-60 minutes at a time) will help sooth soreness & bruising. Some people prefer to use ice alone, heat alone, or alternate between ice & heat.  Experiment and see what works best for you.  Consider trying ice for the first few days to help decrease swelling and bruising; then, switch to heat to help relax sore spots and speed recovery. Shower every day.  Short baths are fine.  It feels good!  Keep the incisions and wounds clean with soap & water.   Try Gentle Massage and/or Stretching Massage at the area of pain many times a day Stop if you feel pain - do not overdo it Take over the counter pain medication This helps the muscle and nerve tissues become less irritable and calm down faster Choose ONE of the following over-the-counter anti-inflammatory medications: Acetaminophen 585m tabs (Tylenol) 1-2 pills with every meal and just before bedtime (avoid if you have liver  problems or if you have acetaminophen in you narcotic prescription) Naproxen 217m tabs (ex. Aleve, Naprosyn) 1-2 pills twice a day (avoid if you have kidney, stomach, IBD, or bleeding problems) Ibuprofen 2093mtabs (ex. Advil, Motrin) 3-4 pills with every meal and just before bedtime (avoid if you have kidney, stomach, IBD, or bleeding problems) Take with food/snack several times a day as directed for at least 2 weeks to help keep pain / soreness down & more manageable. Take Narcotic prescription pain medication for more severe pain A prescription for strong pain control is often given to you upon discharge (for example: oxycodone/Percocet, hydrocodone/Norco/Vicodin, or tramadol/Ultram) Take your pain medication as prescribed. Be mindful that most narcotic prescriptions contain Tylenol (acetaminophen) as well - avoid taking too much Tylenol. If you are having  problems/concerns with the prescription medicine (does not control pain, nausea, vomiting, rash, itching, etc.), please call [phone] see if we need to switch you to a different pain medicine that will work better for you and/or control your side effects better. If you need a refill on your pain medication, you must call the office before 4 pm and on weekdays only.  By federal law, prescriptions for narcotics cannot be called into a pharmacy.  They must be filled out on paper & picked up from our office by the patient or authorized caretaker.  Prescriptions cannot be filled after 4 pm nor on weekends.    WHEN TO CALL [phone] uncontrolled or worsening pain  Fever over 101 F (38.5 C) Concerns with the incision: Worsening pain, redness, rash/hives, swelling, bleeding, or drainage Reactions / problems with new medications (itching, rash, hives, nausea, etc.) Nausea and/or vomiting Difficulty urinating Difficulty breathing Worsening fatigue, dizziness, lightheadedness, blurred vision Other concerns If you are not getting better after two weeks or are noticing you are getting worse, contact our office [phone] for further advice.  We may need to adjust your medications, re-evaluate you in the office, send you to the emergency room, or see what other things we can do to help. The clinic staff is available to answer your questions during regular business hours (8:30am-5pm).  Please don't hesitate to call and ask to speak to one of our nurses for clinical concerns.    [facility] is always on call at the hospitals 24 hours/day If you have a medical emergency, go to the nearest emergency room or call 911.  FOLLOW UP in our office One the day of your discharge from the hospital (or the next business weekday), please call [facility] to set up or confirm an appointment to see your surgeon in the office for a follow-up appointment.   Usually it is 2-3 weeks after your surgery.   If you have skin staples at your incision(s), let the office know so we can set up a time in the office for the nurse to remove them (usually around 10 days after surgery). Make sure that you call for appointments the day of discharge (or the next business weekday) from the hospital to ensure a convenient appointment time. IF YOU HAVE DISABILITY OR FAMILY LEAVE FORMS, BRING THEM TO THE OFFICE FOR PROCESSING.  DO NOT GIVE THEM TO YOUR DOCTOR.  [facility] [address] [address]  [id number] ([phone] Main [phone] [place] ([phone] Fax [email or web address]    GETTING TO GOOD BOWEL HEALTH. It is expected  for your digestive tract to need a few months to get back to normal.  It is common for your bowel movements and stools to be irregular.  You will have occasional bloating and cramping that should eventually fade away.  Until you are eating solid food normally, off all pain medications, and back to regular activities; your bowels will not be normal.   Avoiding constipation The goal: ONE SOFT BOWEL MOVEMENT A DAY!    Drink plenty of fluids.  Choose water first. TAKE A FIBER SUPPLEMENT EVERY DAY THE REST OF YOUR LIFE During your first week back home, gradually add back a fiber supplement every day Experiment which form you can tolerate.   There are many forms such as powders, tablets, wafers, gummies, etc Psyllium bran (Metamucil), methylcellulose (Citrucel), Miralax or Glycolax, Benefiber, Flax Seed.  Adjust the dose week-by-week (1/2 dose/day to 6 doses a day) until you are moving your bowels 1-2 times a day.  Cut back the dose or try a different fiber product if it is giving you problems such as diarrhea or bloating. Sometimes a laxative is needed to help jump-start bowels if constipated until the fiber supplement can help regulate your bowels.  If you are tolerating eating  & you are farting, it is okay to try a gentle laxative such as double dose MiraLax, prune juice, or Milk of Magnesia.  Avoid using laxatives too often. Stool softeners can sometimes help counteract the constipating effects of narcotic pain medicines.  It can also cause diarrhea, so avoid using for too long. If you are still constipated despite taking fiber daily, eating solids, and a few doses of laxatives, call our office. Controlling diarrhea Try drinking liquids and eating bland foods for a few days to avoid stressing your intestines further. Avoid dairy products (especially milk & ice cream) for a short time.  The intestines often can lose the ability to digest lactose when stressed. Avoid foods that cause gassiness or bloating.  Typical foods include beans and other legumes, cabbage, broccoli, and dairy foods.  Avoid greasy, spicy, fast foods.  Every person has some sensitivity to other foods, so listen to your body and avoid those foods that trigger problems for you. Probiotics (such as active yogurt, Align, etc) may help repopulate the intestines and colon with normal bacteria and calm down a sensitive digestive tract Adding a fiber supplement gradually can help thicken stools by absorbing excess fluid and retrain the intestines to act more normally.  Slowly increase the dose over a few weeks.  Too much fiber too soon can backfire and cause cramping & bloating. It is okay to try and slow down diarrhea with a few doses of antidiarrheal medicines.   Bismuth subsalicylate (ex. Kayopectate, Pepto Bismol) for a few doses can help control diarrhea.  Avoid if pregnant.   Loperamide (Imodium) can slow down diarrhea.  Start with one tablet (6m) first.  Avoid if you are having fevers or severe pain.  ILEOSTOMY PATIENTS WILL HAVE CHRONIC DIARRHEA since their colon is not in use.    Drink plenty of liquids.  You will need to drink even more glasses of water/liquid a day to avoid getting dehydrated. Record  output from your ileostomy.  Expect to empty the bag every 3-4 hours at first.  Most people with a permanent ileostomy empty their bag 4-6 times at the least.   Use antidiarrheal medicine (especially Imodium) several times a day to avoid getting dehydrated.  Start with a dose at bedtime &  breakfast.  Adjust up or down as needed.  Increase antidiarrheal medications as directed to avoid emptying the bag more than 8 times a day (every 3 hours). Work with your wound ostomy nurse to learn care for your ostomy.  See ostomy care instructions. TROUBLESHOOTING IRREGULAR BOWELS 1) Start with a soft & bland diet. No spicy, greasy, or fried foods.  2) Avoid gluten/wheat or dairy products from diet to see if symptoms improve. 3) Miralax 17gm or flax seed mixed in [place]. water or juice-daily. May use 2-4 times a day as needed. 4) Gas-X, Phazyme, etc. as needed for gas & bloating.  5) Prilosec (omeprazole) over-the-counter as needed 6)  Consider probiotics (Align, Activa, etc) to help calm the bowels down  Call your doctor if you are getting worse or not getting better.  Sometimes further testing (cultures, endoscopy, X-ray studies, CT scans, bloodwork, etc.) may be needed to help diagnose and treat the cause of the diarrhea. [facility], [place], [place], [place], [place]  [id number] [phone] - Main.    [phone]  - Toll Free.   [phone] - Fax [email or web address]

## 2022-05-11 NOTE — Interval H&P Note (Signed)
History and Physical Interval Note:  05/11/2022 8:59 AM  Rosalyn ChartersWilliam S Zenker  has presented today for surgery, with the diagnosis of ACUTE APPENDICITIS.  The various methods of treatment have been discussed with the patient and family. After consideration of risks, benefits and other options for treatment, the patient has consented to  Procedure(s): APPENDECTOMY LAPAROSCOPIC (N/A) as a surgical intervention.  The patient's history has been reviewed, patient examined, no change in status, stable for surgery.  I have reviewed the patient's chart and labs.  Questions were answered to the patient's satisfaction.    I have re-reviewed the the patient's records, history, medications, and allergies.  I have re-examined the patient.  Patient with fever spikes and leukocytosis not resolving.  Still with pain.  I do not think we can wait another day.  Recommended operative exploration.    INR greater than 1.7.  Discussed with anesthesia team.  Dr. Sampson GoonFitzgerald agrees with plan to give FFP perioperatively for the patient to be transient not coagulopathic.    I again discussed intraoperative plans and goals of post-operative recovery with the patient and his spouse.  Both agree to proceed.  Rosalyn ChartersWilliam S Skyles  06-Jun-1955 [id number]  Patient Care Team: Adolph PollackSmith, Natalie Reid, FNP as PCP - General (Family Medicine)  Patient Active Problem List   Diagnosis Date Noted   Acute appendicitis 05/10/2022   Chronic anticoagulation 05/10/2022   Family history of CVA 05/10/2022   History of DVT (deep vein thrombosis) 05/10/2022   Acute perforated appendicitis 05/09/2022   Actinic keratosis 04/12/2021   Prediabetes 04/12/2021   Coronary artery disease involving native coronary artery of native heart without angina pectoris 07/26/2018   Abnormal stress test 07/19/2018   NSVT (nonsustained ventricular tachycardia) 07/19/2018   Thrombocytopathia 07/19/2018   Exertional chest pain 06/20/2018   Palpitations 06/20/2018   H/O  mitral valve prolapse 06/20/2018   Venous insufficiency of right lower extremity 01/16/2014   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 06/06/2013   Tremor 06/05/2013   Abnormal involuntary movement 06/05/2013   Leishmaniasis, mucocutaneous 04/14/2013   History of pulmonary embolism 04/14/2013   Hypogonadism male 01/12/2012   Hx of colonic polyps 01/08/2012   Erectile dysfunction 12/03/2011   Myalgia 08/18/2011   External hemorrhoid 08/18/2011   Left lateral epicondylitis 04/08/2011   Mixed hyperlipidemia 02/15/2011   Obesity 02/15/2011   Pure hypercholesterolemia 02/15/2011   Leishmaniasis, cutaneous, American 12/01/2010   Pulmonary embolism 11/28/2010    Past Medical History:  Diagnosis Date   Chest pain    Colon polyps    2001, 2007   Exertional angina 07/22/2018   History of chicken pox    childhood   Hyperlipidemia    Leishmaniasis 05-2011   Pulmonary embolus     Past Surgical History:  Procedure Laterality Date   [facility]   COLONOSCOPY  [id number]   polyps removed both times   CORONARY ULTRASOUND/IVUS N/A 07/23/2018   Procedure: Intravascular Ultrasound/IVUS;  Surgeon: Elder NegusPatwardhan, Manish J, MD;  Location: [facility];  Service: Cardiovascular;  Laterality: N/A;   LEFT HEART CATH AND CORONARY ANGIOGRAPHY N/A 07/23/2018   Procedure: LEFT HEART CATH AND CORONARY ANGIOGRAPHY;  Surgeon: Elder NegusPatwardhan, Manish J, MD;  Location: [facility];  Service: Cardiovascular;  Laterality: N/A;   ROTATOR CUFF REPAIR  [place]   [facility] [place]   TENDON REPAIR  2005   right peroneal tendon-[facility]    Social History   Socioeconomic History  Marital status: Married    Spouse name: Not on file   Number of children: 3   Years of education: Not on file   Highest education level: Not on file  Occupational History   Not on file  Tobacco Use   Smoking status: Former    Packs/day: 1.00     Years: 7.00    Additional pack years: 0.00    Total pack years: 7.00    Types: Cigarettes    Quit date: 06/20/1983    Years since quitting: 38.9   Smokeless tobacco: Never   Tobacco comments:    quit 1984 1/2 ppd for 4 years  Vaping Use   Vaping Use: Never used  Substance and Sexual Activity   Alcohol use: No   Drug use: No   Sexual activity: Not on file  Other Topics Concern   Not on file  Social History Narrative   Married, 3 children   Right handed   Masters degree   1 cup daily   Social Determinants of Health   Financial Resource Strain: Not on file  Food Insecurity: Not on file  Transportation Needs: Not on file  Physical Activity: Not on file  Stress: Not on file  Social Connections: Not on file  Intimate Partner Violence: Not on file    Family History  Problem Relation Age of Onset   Prostate cancer Father        alive   Clotting disorder Father        blood clots   Hyperlipidemia Mother    Hypertension Mother    Diabetes Mother    Asthma Mother    Stroke Paternal Grandfather    Stroke Maternal Grandfather    Heart attack Neg Hx    Sudden death Neg Hx    Colon cancer Neg Hx     Medications Prior to Admission  Medication Sig Dispense Refill Last Dose   Acetaminophen (TYLENOL PO) Take 1 tablet by mouth as needed (pain).   Past Month   atorvastatin (LIPITOR) 40 MG tablet Take 1 tablet (40 mg total) by mouth at bedtime. 90 tablet 3 05/08/2022   metoprolol succinate (TOPROL-XL) 25 MG 24 hr tablet TAKE 1 TABLET DAILY (Patient taking differently: Take 25 mg by mouth at bedtime.) 90 tablet 3 05/08/2022 at 2230-2300   Multiple Vitamins-Minerals (MULTIVITAMIN WITH MINERALS) tablet Take 1 tablet by mouth at bedtime.   05/08/2022   nitroGLYCERIN (NITROSTAT) 0.4 MG SL tablet Place 1 tablet (0.4 mg total) under the tongue every 5 (five) minutes as needed for chest pain. 30 tablet 2 never   sildenafil (REVATIO) 20 MG tablet Take 20 mg by mouth as needed (erectile  disfunction).   Past Week   warfarin (COUMADIN) 10 MG tablet Take 1 tablet (10 mg total) by mouth daily. (Patient taking differently: Take 10 mg by mouth every evening.) 30 tablet 2 05/08/2022 at 1900   enoxaparin (LOVENOX) 100 MG/ML injection Inject 1 mL (100 mg total) into the skin every 12 (twelve) hours for 7 days. (Patient not taking: Reported on 05/10/2022) 14 mL 0 Completed Course   warfarin (COUMADIN) 5 MG tablet Take 1 tablet (5 mg total) by mouth daily. (Patient not taking: Reported on 05/10/2022) 30 tablet 4 Not Taking    Current Facility-Administered Medications  Medication Dose Route Frequency Provider Last Rate Last Admin   [MAR Hold] 0.9 %  sodium chloride infusion (Manually program via Guardrails IV Fluids)   Intravenous Once Meuth, Candise Bowens       [  MAR Hold] acetaminophen (TYLENOL) tablet 650 mg  650 mg Oral Q6H PRN Kinsinger, De Blanch, MD   650 mg at 05/11/22 0214   Or   [MAR Hold] acetaminophen (TYLENOL) suppository 650 mg  650 mg Rectal Q6H PRN Kinsinger, De Blanch, MD       bupivacaine liposome (EXPAREL) 1.3 % injection 266 mg  20 mL Infiltration Once Karie Soda, MD       cefTRIAXone (ROCEPHIN) 2 g in sodium chloride 0.9 % 100 mL IVPB  2 g Intravenous On Call to OR Karie Soda, MD       And   metroNIDAZOLE (FLAGYL) IVPB 500 mg  500 mg Intravenous On Call to OR Karie Soda, MD       Chlorhexidine Gluconate Cloth 2 % PADS 6 each  6 each Topical On Call to OR Karie Soda, MD       dextrose 5 %-0.45 % sodium chloride infusion   Intravenous Continuous Kinsinger, De Blanch, MD 75 mL/hr at 05/10/22 1210 Restarted at 05/10/22 1210   [MAR Hold] diphenhydrAMINE (BENADRYL) 12.5 MG/5ML elixir 12.5 mg  12.5 mg Oral Q6H PRN Kinsinger, De Blanch, MD       Or   Mitzi Hansen Hold] diphenhydrAMINE (BENADRYL) injection 12.5 mg  12.5 mg Intravenous Q6H PRN Kinsinger, De Blanch, MD       lactated ringers infusion   Intravenous Continuous Gaynelle Adu, MD       [facility] Hold]  metoprolol succinate (TOPROL-XL) 24 hr tablet 25 mg  25 mg Oral QHS Meuth, Brooke A, PA-C   25 mg at 05/10/22 2204   [MAR Hold] metoprolol tartrate (LOPRESSOR) injection 5 mg  5 mg Intravenous Q6H PRN Kinsinger, De Blanch, MD       metroNIDAZOLE (FLAGYL) 500 MG/100ML IVPB            [MAR Hold] morphine (PF) 2 MG/ML injection 2 mg  2 mg Intravenous Q2H PRN Kinsinger, De Blanch, MD       [MAR Hold] ondansetron (ZOFRAN-ODT) disintegrating tablet 4 mg  4 mg Oral Q6H PRN Kinsinger, De Blanch, MD       Or   Mitzi Hansen Hold] ondansetron [facility]) injection 4 mg  4 mg Intravenous Q6H PRN Kinsinger, De Blanch, MD       [facility] Hold] oxyCODONE (Oxy IR/ROXICODONE) immediate release tablet 5 mg  5 mg Oral Q4H PRN Kinsinger, De Blanch, MD   5 mg at 05/10/22 1535   [MAR Hold] piperacillin-tazobactam (ZOSYN) IVPB 3.375 g  3.375 g Intravenous Q8H Cherylin Mylar, RPH 12.5 mL/hr at 05/11/22 0649 3.375 g at 05/11/22 0649     No Known Allergies  BP (!) 106/50 (BP Location: Right Arm)   Pulse 73   Temp 99 F (37.2 C) (Oral)   Resp 18   Ht 6' (1.829 m)   Wt 105.7 kg   SpO2 94%   BMI 31.60 kg/m   Labs: Results for orders placed or performed during the hospital encounter of 05/09/22 (from the past 48 hour(s))  CBC with Differential     Status: None   Collection Time: 05/09/22  6:18 PM  Result Value Ref Range   WBC 8.4 4.0 - 10.5 K/uL   RBC 4.74 4.22 - 5.81 MIL/uL   Hemoglobin 14.2 13.0 - 17.0 g/dL   HCT 16.1 09.6 - 04.5 %   MCV 88.0 80.0 - 100.0 fL   MCH 30.0 26.0 - 34.0 pg   MCHC 34.1 30.0 - 36.0 g/dL   RDW 40.9 81.1 -  15.5 %   Platelets 163 150 - 400 K/uL   nRBC 0.0 0.0 - 0.2 %   Neutrophils Relative % 77 %   Neutro Abs 6.5 1.7 - 7.7 K/uL   Lymphocytes Relative 12 %   Lymphs Abs 1.0 0.7 - 4.0 K/uL   Monocytes Relative 9 %   Monocytes Absolute 0.7 0.1 - 1.0 K/uL   Eosinophils Relative 1 %   Eosinophils Absolute 0.1 0.0 - 0.5 K/uL   Basophils Relative 1 %   Basophils Absolute 0.0 0.0 - 0.1 K/uL    Immature Granulocytes 0 %   Abs Immature Granulocytes 0.02 0.00 - 0.07 K/uL    Comment: Performed at [facility], [address] [place], [place] [address]  Comprehensive metabolic panel     Status: Abnormal   Collection Time: 05/09/22  6:18 PM  Result Value Ref Range   Sodium 136 135 - 145 mmol/L   Potassium 4.0 3.5 - 5.1 mmol/L   Chloride 102 98 - 111 mmol/L   CO2 25 22 - 32 mmol/L   Glucose, Bld 115 (H) 70 - 99 mg/dL    Comment: Glucose reference range applies only to samples taken after fasting for at least 8 hours.   BUN 22 8 - 23 mg/dL   Creatinine, Ser 6.04 0.61 - 1.24 mg/dL   Calcium 8.3 (L) 8.9 - 10.3 mg/dL   Total Protein 7.0 6.5 - 8.1 g/dL   Albumin 3.7 3.5 - 5.0 g/dL   AST 32 15 - 41 U/L   ALT 29 0 - 44 U/L   Alkaline Phosphatase 81 38 - 126 U/L   Total Bilirubin 1.2 0.3 - 1.2 mg/dL   GFR, Estimated >54 >09 mL/min    Comment: (NOTE) Calculated using the CKD-EPI Creatinine Equation (2021)    Anion gap 9 5 - 15    Comment: Performed at [facility], [address] [place], [place] [address]  Lipase, blood     Status: None   Collection Time: 05/09/22  6:18 PM  Result Value Ref Range   Lipase 38 11 - 51 U/L    Comment: Performed at [facility], [address] [place], [place] [address]  Urinalysis, Routine w reflex microscopic -Urine, Clean Catch     Status: Abnormal   Collection Time: 05/09/22  6:18 PM  Result Value Ref Range   Color, Urine YELLOW YELLOW   APPearance CLEAR CLEAR   Specific Gravity, Urine 1.020 1.005 - 1.030   pH 6.0 5.0 - 8.0   Glucose, UA NEGATIVE NEGATIVE mg/dL   Hgb urine dipstick NEGATIVE NEGATIVE   Bilirubin Urine NEGATIVE NEGATIVE   Ketones, ur 15 (A) NEGATIVE mg/dL   Protein, ur NEGATIVE NEGATIVE mg/dL   Nitrite NEGATIVE NEGATIVE   Leukocytes,Ua NEGATIVE NEGATIVE    Comment: Microscopic not done on urines with negative protein, blood, leukocytes, nitrite, or glucose < 500 mg/dL. Performed at [facility], [address] [place], [place] [address]   Protime-INR     Status: Abnormal   Collection Time: 05/09/22  6:18 PM  Result Value Ref Range   Prothrombin Time 31.6 (H) 11.4 - 15.2 seconds   INR 3.1 (H) 0.8 - 1.2    Comment: (NOTE) INR goal varies based on device and disease states. Performed at [facility], [address] [place], [place] [address]   Lactic acid, plasma     Status: None  Collection Time: 05/09/22  9:45 PM  Result Value Ref Range   Lactic Acid, Venous 0.8 0.5 - 1.9 mmol/L    Comment: Performed at [facility], [address] [place], [place] [address]  Miscellaneous [facility] test (send-out)     Status: None   Collection Time: 05/10/22  4:32 AM  Result Value Ref Range   [facility] test code [id number]    [facility] test name HIV P24 ANTIGEN ANTIBODY    Source ([facility]) SERUM     Comment: Performed at [facility], [address] [place], [place] [address]   Misc [facility] result COMMENT     Comment: (NOTE) Test Ordered: [id number] HIV Ab/p24 Ag with Reflex HIV Ab/p24 Ag Screen           Note:                     BN     Non Reactive                                                  Reference Range: Non Reactive                          HIV-1/HIV-2 antibodies and HIV-1 p24 antigen were NOT detected. There is no laboratory evidence of HIV infection. HIV Negative Performed At: [facility] [address] [place], [place] [id number] Jolene Schimke MD HQ:[id number]   Basic metabolic panel     Status: Abnormal   Collection Time: 05/10/22  4:38 AM  Result Value Ref Range   Sodium 135 135 - 145 mmol/L   Potassium 3.8 3.5 - 5.1 mmol/L   Chloride 102 98 - 111 mmol/L   CO2 26 22 - 32 mmol/L   Glucose, Bld 154 (H) 70 - 99 mg/dL    Comment: Glucose reference range applies only to samples taken after fasting for at least 8 hours.   BUN 20 8 - 23 mg/dL   Creatinine, Ser 1.32 (H) 0.61 - 1.24 mg/dL   Calcium 7.8 (L) 8.9 - 10.3  mg/dL   GFR, Estimated >44 >01 mL/min    Comment: (NOTE) Calculated using the CKD-EPI Creatinine Equation (2021)    Anion gap 7 5 - 15    Comment: Performed at [facility], [address] [place], [place] [address]  CBC     Status: Abnormal   Collection Time: 05/10/22  4:38 AM  Result Value Ref Range   WBC 8.4 4.0 - 10.5 K/uL   RBC 4.43 4.22 - 5.81 MIL/uL   Hemoglobin 13.4 13.0 - 17.0 g/dL   HCT 36.6 44.0 - 34.7 %   MCV 90.5 80.0 - 100.0 fL   MCH 30.2 26.0 - 34.0 pg   MCHC 33.4 30.0 - 36.0 g/dL   RDW 42.5 95.6 - 38.7 %   Platelets 148 (L) 150 - 400 K/uL   nRBC 0.0 0.0 - 0.2 %    Comment: Performed at [facility], [address] [place], [place] [address]  Protime-INR     Status: Abnormal   Collection Time: 05/10/22  6:11 AM  Result Value Ref Range   Prothrombin Time 30.7 (H) 11.4 - 15.2 seconds   INR 3.0 (H) 0.8 - 1.2    Comment: (NOTE) INR goal  varies based on device and disease states. Performed at [facility], [address], [place], [place] [address]   MRSA Next Gen by PCR, Nasal     Status: None   Collection Time: 05/10/22  2:29 PM   Specimen: Nasal Mucosa; Nasal Swab  Result Value Ref Range   MRSA by PCR Next Gen NOT DETECTED NOT DETECTED    Comment: (NOTE) The GeneXpert MRSA Assay (FDA approved for NASAL specimens only), is one component of a comprehensive MRSA colonization surveillance program. It is not intended to diagnose MRSA infection nor to guide or monitor treatment for MRSA infections. Test performance is not FDA approved in patients less than 76 years old. Performed at [facility], [address], [place], [place] [address]   Heparin level (unfractionated)     Status: None   Collection Time: 05/10/22  5:31 PM  Result Value Ref Range   Heparin Unfractionated 0.52 0.30 - 0.70 IU/mL    Comment: (NOTE) The clinical reportable range upper limit is being lowered to >1.10 to  align with the FDA approved guidance for the current laboratory assay.  If heparin results are below expected values, and patient dosage has  been confirmed, suggest follow up testing of antithrombin III levels. Performed at [facility], [address], [place], [place] [address]   Protime-INR     Status: Abnormal   Collection Time: 05/10/22  5:31 PM  Result Value Ref Range   Prothrombin Time 25.4 (H) 11.4 - 15.2 seconds   INR 2.3 (H) 0.8 - 1.2    Comment: (NOTE) INR goal varies based on device and disease states. Performed at [facility], [address] [place], [place] [address]   Protime-INR     Status: Abnormal   Collection Time: 05/11/22  4:51 AM  Result Value Ref Range   Prothrombin Time 23.0 (H) 11.4 - 15.2 seconds   INR 2.1 (H) 0.8 - 1.2    Comment: (NOTE) INR goal varies based on device and disease states. Performed at [facility], [address], [place], [place] [address]   CBC     Status: Abnormal   Collection Time: 05/11/22  4:51 AM  Result Value Ref Range   WBC 9.2 4.0 - 10.5 K/uL   RBC 4.17 (L) 4.22 - 5.81 MIL/uL   Hemoglobin 12.6 (L) 13.0 - 17.0 g/dL   HCT 13.0 (L) 86.5 - 78.4 %   MCV 90.2 80.0 - 100.0 fL   MCH 30.2 26.0 - 34.0 pg   MCHC 33.5 30.0 - 36.0 g/dL   RDW 69.6 29.5 - 28.4 %   Platelets 113 (L) 150 - 400 K/uL   nRBC 0.0 0.0 - 0.2 %    Comment: Performed at [facility], [address] [place], [place] [address]  ABO/Rh     Status: None   Collection Time: 05/11/22  4:51 AM  Result Value Ref Range   ABO/RH(D)      O POS Performed at [facility], [address], [place], [place] [address]   Type and screen [facility]     Status: None (Preliminary result)   Collection Time: 05/11/22  7:55 AM  Result Value Ref Range   ABO/RH(D) O POS    Antibody Screen PENDING    Sample Expiration      05/14/2022,2359 Performed at [facility], [address] [place], [place] [address]  Prepare fresh frozen plasma     Status: None (Preliminary result)   Collection Time: 05/11/22  7:55 AM  Result Value Ref Range   Unit Number [id number]    Blood Component Type THW PLS APHR    Unit division B0    Status of Unit ALLOCATED    Transfusion Status      OK TO TRANSFUSE Performed at [facility] [place], [address], [place], [place] [address]    Unit Number [id number]    Blood Component Type THW PLS APHR    Unit division 00    Status of Unit ALLOCATED    Transfusion Status OK TO TRANSFUSE     Imaging / Studies: CT ABDOMEN PELVIS W CONTRAST  Result Date: 05/09/2022 CLINICAL DATA:  Acute generalized abdominal pain. EXAM: CT ABDOMEN AND PELVIS WITH CONTRAST TECHNIQUE: Multidetector CT imaging of the abdomen and pelvis was performed using the standard protocol following bolus administration of intravenous contrast. RADIATION DOSE REDUCTION: This exam was performed according to the departmental dose-optimization program which includes automated exposure control, adjustment of the mA and/or kV according to patient size and/or use of iterative reconstruction technique. CONTRAST:  OMNIPAQUE IOHEXOL 300 MG/ML  SOLN COMPARISON:  None Available. FINDINGS: Lower chest: No acute abnormality. Hepatobiliary: No focal liver abnormality is seen. No gallstones, gallbladder wall thickening, or biliary dilatation. Pancreas: Unremarkable. No pancreatic ductal dilatation or surrounding inflammatory changes. Spleen: Normal in size without focal abnormality. Adrenals/Urinary Tract: Adrenal glands are unremarkable. Kidneys are normal, without renal calculi, focal lesion, or hydronephrosis. Bladder is unremarkable. Stomach/Bowel: The stomach is unremarkable. There is no evidence of bowel obstruction. The appendix is severely enlarged at 2 cm with a large amount of surrounding inflammation consistent with acute  appendicitis. A portion of the wall of the appendix is not well visualized and perforation cannot be excluded. This is best seen on image number 67 of series 2. Vascular/Lymphatic: No significant vascular findings are present. No enlarged abdominal or pelvic lymph nodes. Reproductive: Mild prostatic enlargement. Other: No abdominal wall hernia or abnormality. No abdominopelvic ascites. Musculoskeletal: No acute or significant osseous findings. IMPRESSION: The appendix is severely enlarged and inflamed concerning for acute appendicitis. There is a portion of the wall of the appendix which is not well visualized suggesting the possibility of perforation. Critical Value/emergent results were called by telephone at the time of interpretation on 05/09/2022 at 8:53 pm to provider Dr. Hart Rochester, who verbally acknowledged these results. Electronically Signed   By: Lupita Raider M.D.   On: 05/09/2022 20:53     .Ardeth Sportsman, M.D., F.A.C.S. Gastrointestinal and Minimally Invasive Surgery [facility] [address], [address] [place], [place] [address] [phone] Main / Paging  05/11/2022 8:59 AM    Ardeth Sportsman

## 2022-05-11 NOTE — Consult Note (Signed)
I have known [name] for quite a while.  He is a very nice 67 year old white male.  He has history of recurrent thromboembolic disease.  He has a recurrent thrombus in the right peroneal vein.  He currently is on Coumadin at 10 mg a day.  Saw him in the office on 05/05/2022.  At that time, his INR was 3.3.  We held on Coumadin at 10 mg a day.  Unfortunately, he began to have some abdominal discomfort.  This happened about 4 5 days ago.  He ultimately ended up in the emergency room.  He had a CT scan of the abdomen.  This showed a enlarged appendix.  There is suggestion of perforation.  He was admitted on 05/09/2022.  At the time of admission, his labs showed a white cell count of 8.4.  Hemoglobin 14.2.  Platelet count was 163.  His INR was 3.1.  He is gotten some vitamin K.  This morning, his INR is 2.1.  He has had no problem with bowels or her bladder.  He has had no fever.  There is no cough or shortness of breath.  He has had no rashes.  He has had no leg swelling.  He will be going to surgery today to remove the appendix.  He did get a dose of vitamin K this morning.  He has had no problems with COVID.   His vital signs are temperature of 99.  Pulse 71.  Blood pressure 100/47.  His Tmax was 102.7.  He is on IV antibiotics.  His head exam shows no ocular or oral lesions.  He has no palpable cervical or supraclavicular lymph nodes.  Lungs are clear bilaterally.  Cardiac exam regular rate and rhythm.  He has no murmurs, rubs or bruits.  Abdomen is soft.  Bowel sounds are decreased.  There are some tenderness in the right lower quadrant.  He has no obvious fluid wave.  There is no palpable liver or spleen tip.  Extremity shows may have some chronic, mild swelling in the right lower leg.  He has good range of motion of his extremities.  He has good strength bilaterally in upper and lower extremities.  He has good pulses.  Neurological exam shows no focal neurological deficits.  Skin exam shows no  ecchymosis or petechia.    [name] has acute appendicitis.  It may be perforated.  He had a temperature yesterday.  He will require lifelong anticoagulation because of his recurrence of the thromboembolism.  He is on heparin right now.  I would clearly keep him on heparin until he is able to take Coumadin again.  Given the vitamin K that he has received, the Coumadin may take several days-or greater than a week-before he gets therapeutic.  As such, he may need to be on Lovenox as an outpatient.  I do not think that there should be any problems with bleeding with the procedure.  Hopefully this can be done laparoscopically.  We will follow along and help out any way that we can.  I probably would get him back on Coumadin tomorrow.  I would have him on 10 mg a day.  I suspect that he probably will need to have a Lovenox "bridge" until his INR gets therapeutic on Coumadin.  I know that he will get incredible care from everybody upon 3 E.   Christin Bach, MD  2 Cor 12:10

## 2022-05-11 NOTE — Anesthesia Procedure Notes (Signed)
Procedure Name: Intubation Date/Time: 05/11/2022 9:48 AM  Performed by: Elisabeth Cara, CRNAPre-anesthesia Checklist: Patient identified, Emergency Drugs available, Suction available, Patient being monitored and Timeout performed Patient Re-evaluated:Patient Re-evaluated prior to induction Oxygen Delivery Method: Circle system utilized Preoxygenation: Pre-oxygenation with 100% oxygen Induction Type: IV induction, Rapid sequence and Cricoid Pressure applied Laryngoscope Size: Mac and 4 Grade View: Grade I Tube type: Oral Tube size: 7.5 mm Number of attempts: 1 Airway Equipment and Method: Stylet Placement Confirmation: ETT inserted through vocal cords under direct vision, positive ETCO2 and breath sounds checked- equal and bilateral Secured at: 21 cm Tube secured with: Tape Dental Injury: Teeth and Oropharynx as per pre-operative assessment  Comments: Smooth RSI with cricoid pressure by Dr Sampson Goon. DL x 1 by CRNA. Grade 1-2 view. ATOI. + ETCO2. BBS=. ETT secured at 21 cm at the lip.

## 2022-05-12 ENCOUNTER — Encounter (HOSPITAL_COMMUNITY): Payer: Self-pay | Admitting: Surgery

## 2022-05-12 DIAGNOSIS — K3532 Acute appendicitis with perforation and localized peritonitis, without abscess: Secondary | ICD-10-CM | POA: Diagnosis not present

## 2022-05-12 LAB — BPAM FFP
ISSUE DATE / TIME: 202404110900
ISSUE DATE / TIME: 202404111204
Unit Type and Rh: 5100
Unit Type and Rh: 5100

## 2022-05-12 LAB — HEPARIN LEVEL (UNFRACTIONATED): Heparin Unfractionated: 0.14 IU/mL — ABNORMAL LOW (ref 0.30–0.70)

## 2022-05-12 LAB — CBC
HCT: 33.3 % — ABNORMAL LOW (ref 39.0–52.0)
Hemoglobin: 10.9 g/dL — ABNORMAL LOW (ref 13.0–17.0)
MCH: 29.5 pg (ref 26.0–34.0)
MCHC: 32.7 g/dL (ref 30.0–36.0)
MCV: 90.2 fL (ref 80.0–100.0)
Platelets: 131 10*3/uL — ABNORMAL LOW (ref 150–400)
RBC: 3.69 MIL/uL — ABNORMAL LOW (ref 4.22–5.81)
RDW: 13.2 % (ref 11.5–15.5)
WBC: 8.1 10*3/uL (ref 4.0–10.5)
nRBC: 0 % (ref 0.0–0.2)

## 2022-05-12 LAB — PREPARE FRESH FROZEN PLASMA: Unit division: 0

## 2022-05-12 LAB — PROTIME-INR
INR: 1.5 — ABNORMAL HIGH (ref 0.8–1.2)
Prothrombin Time: 18 seconds — ABNORMAL HIGH (ref 11.4–15.2)

## 2022-05-12 LAB — CREATININE, SERUM
Creatinine, Ser: 0.96 mg/dL (ref 0.61–1.24)
GFR, Estimated: >60 mL/min (ref >60)

## 2022-05-12 LAB — SURGICAL PATHOLOGY

## 2022-05-12 LAB — POTASSIUM: Potassium: 3.9 mmol/L (ref 3.5–5.1)

## 2022-05-12 MED ORDER — METHOCARBAMOL 1000 MG/10ML IJ SOLN
500.0000 mg | Freq: Four times a day (QID) | INTRAVENOUS | Status: DC
  Administered 2022-05-12 – 2022-05-16 (×16): 500 mg via INTRAVENOUS
  Filled 2022-05-12: qty 500
  Filled 2022-05-12: qty 5
  Filled 2022-05-12 (×2): qty 500
  Filled 2022-05-12: qty 5
  Filled 2022-05-12 (×2): qty 500
  Filled 2022-05-12: qty 5
  Filled 2022-05-12: qty 500
  Filled 2022-05-12: qty 5
  Filled 2022-05-12 (×7): qty 500
  Filled 2022-05-12 (×2): qty 5
  Filled 2022-05-12: qty 500

## 2022-05-12 MED ORDER — HYDROMORPHONE HCL 1 MG/ML IJ SOLN
0.5000 mg | INTRAMUSCULAR | Status: DC | PRN
  Administered 2022-05-14: 1 mg via INTRAVENOUS
  Filled 2022-05-12: qty 1

## 2022-05-12 MED ORDER — HEPARIN (PORCINE) 25000 UT/250ML-% IV SOLN
1600.0000 [IU]/h | INTRAVENOUS | Status: DC
  Administered 2022-05-12 – 2022-05-15 (×6): 1600 [IU]/h via INTRAVENOUS
  Filled 2022-05-12 (×6): qty 250

## 2022-05-12 MED ORDER — ACETAMINOPHEN 10 MG/ML IV SOLN
1000.0000 mg | Freq: Four times a day (QID) | INTRAVENOUS | Status: AC
  Administered 2022-05-12 – 2022-05-13 (×4): 1000 mg via INTRAVENOUS
  Filled 2022-05-12 (×3): qty 100

## 2022-05-12 MED ORDER — ZOLPIDEM TARTRATE 5 MG PO TABS
5.0000 mg | ORAL_TABLET | Freq: Every evening | ORAL | Status: DC | PRN
  Administered 2022-05-12 – 2022-05-13 (×2): 5 mg via ORAL
  Filled 2022-05-12 (×2): qty 1

## 2022-05-12 NOTE — Progress Notes (Signed)
ANTICOAGULATION CONSULT NOTE - Initial Consult  Pharmacy Consult for heparin, warfarin  Indication:  history of PE/DVT on warfarin  No Known Allergies  Patient Measurements: Height: 6' (182.9 cm) Weight: 105.7 kg (233 lb 0.4 oz) IBW/kg (Calculated) : 77.6 Heparin Dosing Weight: 99 kg   Medications: Warfarin PTA for history of recurrent VTE -Home dose: 10 mg PO daily. INR was therapeutic on admission -Last dose: 4/8  Infusions:   acetaminophen 1,000 mg (05/12/22 1204)   dextrose 5 % and 0.45% NaCl 75 mL/hr at 05/11/22 1452   heparin 1,600 Units/hr (05/12/22 1056)   lactated ringers     lactated ringers 10 mL/hr at 05/11/22 0942   methocarbamol (ROBAXIN) IV 500 mg (05/12/22 0951)   piperacillin-tazobactam (ZOSYN)  IV 3.375 g (05/12/22 0602)     Brief Assessment (see full note earlier today from Keturah Barre PharmD): Pt is a 21 yoM who presented with appendicitis s/p laparascopic appendectomy on 4/11 for acute perforated appendicitis with abscess and peritonitis.  PMH significant for recurrent VTE  on warfarin.  Pharmacy is consulted to dose heparin IV perioperatively while warfarin is held and NPO for ileus.    4/12 Warfarin per pharmacy dosing consult ordered by Dr. Myna Hidalgo, but he remains NPO with NG tube to suction.  CCS PA M.Maczis confirms that oral anticoagulation should be held until the patient is tolerating POs.  Timing for oral anticoagulation restart TBD.    Heparin level 0.14 on heparin 1600 units/hr --- Note:  RN reports heparin infiltrated and was off from ~18:15-18:40.  It was restarted and is now infusing well, however, level was drawn shortly after restarting.    No s/s bleeding or complications reported.    Goal of Therapy:  Heparin level 0.3-0.7 units/ml Monitor platelets by anticoagulation protocol: Yes   Plan:  No bolus since pt is POD#1 Continue heparin infusion at previously therapeutic rate of 1600 units/hr Check heparin level in 8 hours from  restart CBC, heparin level daily Follow along for ability to resume warfarin   Lynann Beaver PharmD, BCPS [facility] main pharmacy [phone] 05/12/2022 8:28 PM

## 2022-05-12 NOTE — Progress Notes (Signed)
ANTICOAGULATION CONSULT NOTE - Initial Consult  Pharmacy Consult for heparin Indication:  history of PE/DVT on warfarin  No Known Allergies  Patient Measurements: Height: 6' (182.9 cm) Weight: 105.7 kg (233 lb 0.4 oz) IBW/kg (Calculated) : 77.6 Heparin Dosing Weight: 99 kg  Vital Signs: Temp: 98.1 F (36.7 C) (04/12 0853) Temp Source: Oral (04/12 0853) BP: 124/73 (04/12 0853) Pulse Rate: 73 (04/12 0853)  Labs: Recent Labs    05/09/22 1818 05/10/22 0438 05/10/22 0611 05/10/22 1731 05/11/22 0451 05/12/22 0546  HGB 14.2 13.4  --   --  12.6* 10.9*  HCT 41.7 40.1  --   --  37.6* 33.3*  PLT 163 148*  --   --  113* 131*  LABPROT 31.6*  --    < > 25.4* 23.0* 18.0*  INR 3.1*  --    < > 2.3* 2.1* 1.5*  HEPARINUNFRC  --   --   --  0.52  --   --   CREATININE 1.16 1.26*  --   --   --  0.96   < > = values in this interval not displayed.    Estimated Creatinine Clearance: 92.5 mL/min (by C-G formula based on SCr of 0.96 mg/dL).   Medical History: Past Medical History:  Diagnosis Date   Chest pain    Colon polyps    2001, 2007   Exertional angina 07/22/2018   History of chicken pox    childhood   Hyperlipidemia    Leishmaniasis 05-2011   Pulmonary embolus     Medications: Warfarin PTA for history of recurrent VTE -Home dose: 10 mg PO daily. INR was therapeutic on admission -Last dose: 4/8  Assessment: Pt is a 21 yoM who presented with abdominal pain. CT abdomen revealed severely enlarged/inflammed appendix concerning for appendicitis with possible perforation. CCS consulted. Pt received PO vitamin K for reversal of warfarin then underwent laparascopic appendectomy on 4/11 for acute perforated appendicitis with abscess and peritonitis.   Dr. Myna Hidalgo follows patient for recurrent VTE and ordered heparin drip pre-op despite therapeutic INR which was turned off 4/11 AM and held for 7 hours prior to surgery.   Pharmacy consulted on 4/12 (POD#1) to resume heparin drip. Pt  remains NPO post-op for ileus.   Significant Events: -4/10: Total of 7.5 mg PO vitamin K given. Heparin started. -4/11: 10 mg PO vitamin K given pre-op. Heparin held for 7 hours prior to surgery -4/12: Resuming heparin on POD#1  Today, 05/12/22 INR = 1.5 is now subtherapeutic s/p vitamin K SCr WNL CBC: Hgb low and slightly decreased post-op. Plt low  Goal of Therapy:  Heparin level 0.3-0.7 units/ml Monitor platelets by anticoagulation protocol: Yes   Plan:  No bolus since pt is POD#1 Initiate heparin infusion at previously therapeutic rate of 1600 units/hr Check heparin level in 6-8 hours CBC, heparin level daily Monitor for signs of bleeding Follow along for ability to resume warfarin  Cindi Carbon, PharmD 05/12/2022,9:44 AM

## 2022-05-12 NOTE — Progress Notes (Signed)
I appreciate the outstanding care that he is getting from everybody upon 3 E.  I also appreciate the incredible skill from Dr. Michaell Cowing.  It sounds like this was a very difficult operation.  He had a perforated appendix.  There was peritonitis.  He now has a NG tube in.  There was no bleeding from what I can tell.  We are holding off on anticoagulation for right now.  He does have on compression devices on his legs.  There is no lab work back yet today.  He will clearly need to be on heparin or Lovenox until he gets anticoagulated with Coumadin.  He has NG tube in which hopefully will be taken out today.  Vital signs are stable.  Temperature is 98.1.  Pulse 71.  Blood pressure 109/71.  His lungs are clear bilaterally.  He has good air movement bilaterally.  Cardiac exam regular rate and rhythm.  Abdomen is soft.  Bowel sounds are markedly decreased.  He has a laparoscopy scars.  He has no obvious fluid.  There is no guarding.  His little bit of tenderness to palpation.  Extremities shows little bit of swelling, which is chronic, in the right leg.  Neurological exam is nonfocal.  Glenn Wheeler has a perforated appendix.  He has some peritonitis with this.  He has an NG tube in right now.  There are no labs back yet.  Hold on the Lovenox/heparin for right now.  Once the NG tube comes out, then we can start Coumadin since this will take about 4-5 days before we get the levels therapeutic.  As always, I do appreciate the wonderful care that he is getting.  Christin Bach, MD  1 Cor 10:31

## 2022-05-12 NOTE — Progress Notes (Addendum)
Pt lt AC infiltrated while infusing heparin drip 16mg /hr. Unsure if Heparin level will be skewed.

## 2022-05-12 NOTE — Progress Notes (Signed)
[facility] Progress Note  1 Day Post-Op  Subjective: CC-  Having some lower abdominal discomfort but pain is well controlled with medication. He already ambulated in the hall this morning and is anxious to walk ago. No flatus or BM. NG output bilious.  Objective: Vital signs in last 24 hours: Temp:  [97.4 F (36.3 C)-99 F (37.2 C)] 98.1 F (36.7 C) (04/12 0853) Pulse Rate:  [54-85] 73 (04/12 0853) Resp:  [12-20] 18 (04/12 0555) BP: (81-129)/(47-77) 124/73 (04/12 0853) SpO2:  [94 %-100 %] 96 % (04/12 0853) Last BM Date : 05/09/22  Intake/Output from previous day: 04/11 0701 - 04/12 0700 In: 2960.8 [I.V.:2427.6; Blood:253; IV Piggyback:280.2] Out: 2005 [Urine:1175; Emesis/NG output:300; Drains:505; Blood:25] Intake/Output this shift: No intake/output data recorded.  PE: Gen:  Alert, NAD, pleasant Abd: soft, some distension, mild lower abdominal tenderness without rebound or guarding, JP serosanguinous. Lap incisions with dressing in place/ some dried blood  Lab Results:  Recent Labs    05/11/22 0451 05/12/22 0546  WBC 9.2 8.1  HGB 12.6* 10.9*  HCT 37.6* 33.3*  PLT 113* 131*   BMET Recent Labs    05/09/22 1818 05/10/22 0438 05/12/22 0546  NA 136 135  --   K 4.0 3.8 3.9  CL 102 102  --   CO2 25 26  --   GLUCOSE 115* 154*  --   BUN 22 20  --   CREATININE 1.16 1.26* 0.96  CALCIUM 8.3* 7.8*  --    PT/INR Recent Labs    05/11/22 0451 05/12/22 0546  LABPROT 23.0* 18.0*  INR 2.1* 1.5*   CMP     Component Value Date/Time   NA 135 05/10/2022 0438   NA 143 06/05/2013 0919   K 3.9 05/12/2022 0546   CL 102 05/10/2022 0438   CO2 26 05/10/2022 0438   GLUCOSE 154 (H) 05/10/2022 0438   BUN 20 05/10/2022 0438   BUN 29 (H) 06/05/2013 0919   CREATININE 0.96 05/12/2022 0546   CREATININE 1.29 (H) 05/05/2022 0931   CREATININE 1.92 (H) 04/28/2013 1027   CALCIUM 7.8 (L) 05/10/2022 0438   PROT 7.0 05/09/2022 1818   PROT 6.6 06/05/2013 0919   ALBUMIN  3.7 05/09/2022 1818   ALBUMIN 4.5 06/05/2013 0919   AST 32 05/09/2022 1818   AST 33 05/05/2022 0931   ALT 29 05/09/2022 1818   ALT 35 05/05/2022 0931   ALKPHOS 81 05/09/2022 1818   BILITOT 1.2 05/09/2022 1818   BILITOT 0.5 05/05/2022 0931   GFRNONAA >60 05/12/2022 0546   GFRNONAA >60 05/05/2022 0931   GFRNONAA 37 (L) 04/28/2013 1027   GFRAA >60 07/23/2018 1215   GFRAA 43 (L) 04/28/2013 1027   Lipase     Component Value Date/Time   LIPASE 38 05/09/2022 1818       Studies/Results: X-ray abdomen AP  Result Date: 05/11/2022 CLINICAL DATA:  NG tube placement. EXAM: ABDOMEN - 1 VIEW COMPARISON:  Acute abdominal series 05/09/2022 FINDINGS: The side port of the NG tube is in the stomach. Heart is enlarged. The lung bases are clear. IMPRESSION: The side port of the NG tube is in the stomach. Electronically Signed   By: Marin Roberts M.D.   On: 05/11/2022 13:06    Anti-infectives: Anti-infectives (From admission, onward)    Start     Dose/Rate Route Frequency Ordered Stop   05/11/22 0843  metroNIDAZOLE (FLAGYL) 500 MG/100ML IVPB       Note to Pharmacy: Vevelyn Royals D: cabinet override  05/11/22 0843 05/11/22 0958   05/11/22 0600  cefTRIAXone (ROCEPHIN) 2 g in sodium chloride 0.9 % 100 mL IVPB       See Hyperspace for full Linked Orders Report.   2 g 200 mL/hr over 30 Minutes Intravenous On call to O.R. 05/10/22 1131 05/11/22 1019   05/11/22 0600  metroNIDAZOLE (FLAGYL) IVPB 500 mg       See Hyperspace for full Linked Orders Report.   500 mg 100 mL/hr over 60 Minutes Intravenous On call to O.R. 05/10/22 1131 05/11/22 1042   05/10/22 0600  piperacillin-tazobactam (ZOSYN) IVPB 3.375 g       See Hyperspace for full Linked Orders Report.   3.375 g 12.5 mL/hr over 240 Minutes Intravenous Every 8 hours 05/09/22 2138     05/09/22 2145  piperacillin-tazobactam (ZOSYN) IVPB 3.375 g       See Hyperspace for full Linked Orders Report.   3.375 g 100 mL/hr over 30 Minutes  Intravenous  Once 05/09/22 2138 05/09/22 2208   05/09/22 2130  piperacillin-tazobactam (ZOSYN) IVPB 3.375 g  Status:  Discontinued        3.375 g 12.5 mL/hr over 240 Minutes Intravenous Every 8 hours 05/09/22 2107 05/09/22 2138        Assessment/Plan ACUTE PERFORATED APPENDICITIS WITH ABSCESS & PERITONITIS  -POD#1 s/p LAPAROSCOPIC APPENDECTOMY, LAPAROSCOPIC DRAINAGE OF ABSCESS 4/11 Dr. Michaell Cowing - Ileus as expected. Continue NPO/NGT to LIWS and await return in bowel function - mobilize - add scheduled IV tylenol and IV robaxin for improved pain control, IV dilaudid PRN - continue JP and monitor - currently serosanguinous - continue IV antibiotics - plan for 5d postop  ID - zosyn  FEN - IVF, NPO/NGT to LIWS VTE - SCDs, will discuss plan with hem/onc as below Foley - out and voiding  Hx DVT/PE on coumadin - reversed preop. INR 1.5 today. Ok for heparin gtt today from surgical standpoint if needed, will reach out to Dr. Myna Hidalgo to see what he recommends  ABL anemia - Hgb 10.9 from 12.6, VSS, repeat CBC in AM HLD HTN CAD     LOS: 2 days    Franne Forts, [facility] 05/12/2022, 9:11 AM Please see Amion for pager number during day hours 7:00am-4:30pm

## 2022-05-12 NOTE — Progress Notes (Signed)
Pharmacy Antibiotic Note  Glenn Wheeler is a 67 y.o. male admitted on 05/09/2022 with acute perforated appendicitis with abscess and peritonitis. S/p vitamin K for warfarin reversal and laparoscopic appendectomy on 4/11. Pharmacy has been consulted for piperacillin/tazobactam dosing.  Today, 05/12/22 SCr WNL Afebrile  Today is day #4 of antibiotics, but POD#1.   Plan: Continue piperacillin/tazobactam 3.375 g IV q8h EI  Renal function stable, no cultures. Pharmacy to sign off.   Height: 6' (182.9 cm) Weight: 105.7 kg (233 lb 0.4 oz) IBW/kg (Calculated) : 77.6  Temp (24hrs), Avg:98.1 F (36.7 C), Min:97.4 F (36.3 C), Max:99 F (37.2 C)  Recent Labs  Lab 05/05/22 0931 05/09/22 1818 05/09/22 2145 05/10/22 0438 05/11/22 0451 05/12/22 0546  WBC 6.9 8.4  --  8.4 9.2 8.1  CREATININE 1.29* 1.16  --  1.26*  --  0.96  LATICACIDVEN  --   --  0.8  --   --   --     Estimated Creatinine Clearance: 92.5 mL/min (by C-G formula based on SCr of 0.96 mg/dL).    No Known Allergies  Antimicrobials this admission: Piperacillin/tazobactam 4/12 >>   Dose adjustments this admission:  Microbiology results: 4/10 MRSA PCR: not detected   Cindi Carbon, PharmD 05/12/2022 7:46 AM

## 2022-05-12 NOTE — Progress Notes (Signed)
Foley removed without difficulty. Patient ambulated the length of the hallway once this am. Patient states pain 3/10. PRN pain medication given once during shift.

## 2022-05-13 DIAGNOSIS — K3532 Acute appendicitis with perforation and localized peritonitis, without abscess: Secondary | ICD-10-CM | POA: Diagnosis not present

## 2022-05-13 LAB — CBC
HCT: 31.5 % — ABNORMAL LOW (ref 39.0–52.0)
Hemoglobin: 10.4 g/dL — ABNORMAL LOW (ref 13.0–17.0)
MCH: 29.8 pg (ref 26.0–34.0)
MCHC: 33 g/dL (ref 30.0–36.0)
MCV: 90.3 fL (ref 80.0–100.0)
Platelets: 144 10*3/uL — ABNORMAL LOW (ref 150–400)
RBC: 3.49 MIL/uL — ABNORMAL LOW (ref 4.22–5.81)
RDW: 13.3 % (ref 11.5–15.5)
WBC: 6.9 10*3/uL (ref 4.0–10.5)
nRBC: 0 % (ref 0.0–0.2)

## 2022-05-13 LAB — PROTIME-INR
INR: 1.5 — ABNORMAL HIGH (ref 0.8–1.2)
Prothrombin Time: 17.7 seconds — ABNORMAL HIGH (ref 11.4–15.2)

## 2022-05-13 LAB — MAGNESIUM: Magnesium: 2.3 mg/dL (ref 1.7–2.4)

## 2022-05-13 LAB — BASIC METABOLIC PANEL
Anion gap: 8 (ref 5–15)
BUN: 19 mg/dL (ref 8–23)
CO2: 24 mmol/L (ref 22–32)
Calcium: 7.4 mg/dL — ABNORMAL LOW (ref 8.9–10.3)
Chloride: 105 mmol/L (ref 98–111)
Creatinine, Ser: 1.1 mg/dL (ref 0.61–1.24)
GFR, Estimated: >60 mL/min (ref >60)
Glucose, Bld: 156 mg/dL — ABNORMAL HIGH (ref 70–99)
Potassium: 3.5 mmol/L (ref 3.5–5.1)
Sodium: 137 mmol/L (ref 135–145)

## 2022-05-13 LAB — HEPARIN LEVEL (UNFRACTIONATED)
Heparin Unfractionated: 0.51 IU/mL (ref 0.30–0.70)
Heparin Unfractionated: 0.57 IU/mL (ref 0.30–0.70)

## 2022-05-13 MED ORDER — ACETAMINOPHEN 10 MG/ML IV SOLN
1000.0000 mg | Freq: Four times a day (QID) | INTRAVENOUS | Status: AC
  Administered 2022-05-13 – 2022-05-14 (×4): 1000 mg via INTRAVENOUS
  Filled 2022-05-13 (×4): qty 100

## 2022-05-13 NOTE — Progress Notes (Signed)
Patient ID: Glenn Wheeler, male   DOB: 1956-01-13, 67 y.o.   MRN: [id number]   Acute Care Surgery Service Progress Note:    Chief Complaint/Subjective: Wife at [facility] No BM No flatus 900cc/NG past 24hrs Ambulated   Objective: Vital signs in last 24 hours: Temp:  [97.9 F (36.6 C)-98.2 F (36.8 C)] 97.9 F (36.6 C) (04/13 0745) Pulse Rate:  [68-77] 68 (04/13 0745) Resp:  [18-20] 20 (04/13 0745) BP: (105-137)/(62-90) 108/62 (04/13 0745) SpO2:  [96 %-97 %] 97 % (04/13 0745) Last BM Date : 05/09/22  Intake/Output from previous day: 04/12 0701 - 04/13 0700 In: 1969.3 [P.O.:120; I.V.:1253.6; NG/GT:50; IV Piggyback:545.7] Out: 1470 [Urine:450; Emesis/NG output:900; Drains:120] Intake/Output this shift: No intake/output data recorded.  Lungs: , nonlabored  Cardiovascular: reg  Abd: soft, min TTP, incisions ok, JP -bloody; not really distended  Extremities: no edema, +SCDs  Neuro: alert, nonfocal  Lab Results: CBC  Recent Labs    05/12/22 0546 05/13/22 0057  WBC 8.1 6.9  HGB 10.9* 10.4*  HCT 33.3* 31.5*  PLT 131* 144*   BMET Recent Labs    05/12/22 0546 05/13/22 0057  NA  --  137  K 3.9 3.5  CL  --  105  CO2  --  24  GLUCOSE  --  156*  BUN  --  19  CREATININE 0.96 1.10  CALCIUM  --  7.4*   LFT    Latest Ref Rng & Units 05/09/2022    6:18 PM 05/05/2022    9:31 AM 04/05/2022    2:13 PM  Hepatic Function  Total Protein 6.5 - 8.1 g/dL 7.0  6.7  6.9   Albumin 3.5 - 5.0 g/dL 3.7  3.8  4.4   AST 15 - 41 U/L 32  33  35   ALT 0 - 44 U/L 29  35  31   Alk Phosphatase 38 - 126 U/L 81  84  74   Total Bilirubin 0.3 - 1.2 mg/dL 1.2  0.5  0.7    PT/INR Recent Labs    05/12/22 0546 05/13/22 0057  LABPROT 18.0* 17.7*  INR 1.5* 1.5*   ABG No results for input(s): "PHART", "HCO3" in the last 72 hours.  Invalid input(s): "PCO2", "PO2"  Studies/Results:  Anti-infectives: Anti-infectives (From admission, onward)    Start     Dose/Rate Route Frequency Ordered  Stop   05/11/22 0843  metroNIDAZOLE (FLAGYL) 500 MG/100ML IVPB       Note to Pharmacy: Vevelyn Royals D: cabinet override      05/11/22 0843 05/11/22 0958   05/11/22 0600  cefTRIAXone (ROCEPHIN) 2 g in sodium chloride 0.9 % 100 mL IVPB       See Hyperspace for full Linked Orders Report.   2 g 200 mL/hr over 30 Minutes Intravenous On call to O.R. 05/10/22 1131 05/11/22 1019   05/11/22 0600  metroNIDAZOLE (FLAGYL) IVPB 500 mg       See Hyperspace for full Linked Orders Report.   500 mg 100 mL/hr over 60 Minutes Intravenous On call to O.R. 05/10/22 1131 05/11/22 1042   05/10/22 0600  piperacillin-tazobactam (ZOSYN) IVPB 3.375 g       See Hyperspace for full Linked Orders Report.   3.375 g 12.5 mL/hr over 240 Minutes Intravenous Every 8 hours 05/09/22 2138     05/09/22 2145  piperacillin-tazobactam (ZOSYN) IVPB 3.375 g       See Hyperspace for full Linked Orders Report.   3.375 g 100 mL/hr over  30 Minutes Intravenous  Once 05/09/22 2138 05/09/22 2208   05/09/22 2130  piperacillin-tazobactam (ZOSYN) IVPB 3.375 g  Status:  Discontinued        3.375 g 12.5 mL/hr over 240 Minutes Intravenous Every 8 hours 05/09/22 2107 05/09/22 2138       Medications: Scheduled Meds:  sodium chloride   Intravenous Once   sodium chloride   Intravenous Once   lip balm   Topical BID   metoprolol succinate  25 mg Oral QHS   Continuous Infusions:  dextrose 5 % and 0.45% NaCl 75 mL/hr at 05/12/22 1900   heparin 1,600 Units/hr (05/13/22 0438)   lactated ringers     lactated ringers 10 mL/hr at 05/11/22 1610   methocarbamol (ROBAXIN) IV 500 mg (05/13/22 0515)   piperacillin-tazobactam (ZOSYN)  IV 3.375 g (05/13/22 0630)   PRN Meds:.acetaminophen **OR** acetaminophen, alum & mag hydroxide-simeth, bisacodyl, diphenhydrAMINE **OR** diphenhydrAMINE, HYDROmorphone (DILAUDID) injection, lactated ringers, magic mouthwash, menthol-cetylpyridinium, metoprolol tartrate, ondansetron **OR** ondansetron (ZOFRAN) IV,  phenol, zolpidem  Assessment/Plan: Patient Active Problem List   Diagnosis Date Noted   Acute appendicitis 05/10/2022   Chronic anticoagulation 05/10/2022   Family history of CVA 05/10/2022   History of DVT (deep vein thrombosis) 05/10/2022   Acute perforated appendicitis 05/09/2022   Actinic keratosis 04/12/2021   Prediabetes 04/12/2021   Coronary artery disease involving native coronary artery of native heart without angina pectoris 07/26/2018   Abnormal stress test 07/19/2018   NSVT (nonsustained ventricular tachycardia) 07/19/2018   Thrombocytopathia 07/19/2018   Exertional chest pain 06/20/2018   Palpitations 06/20/2018   H/O mitral valve prolapse 06/20/2018   Venous insufficiency of right lower extremity 01/16/2014   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 06/06/2013   Tremor 06/05/2013   Abnormal involuntary movement 06/05/2013   Leishmaniasis, mucocutaneous 04/14/2013   History of pulmonary embolism 04/14/2013   Hypogonadism male 01/12/2012   Hx of colonic polyps 01/08/2012   Erectile dysfunction 12/03/2011   Myalgia 08/18/2011   External hemorrhoid 08/18/2011   Left lateral epicondylitis 04/08/2011   Mixed hyperlipidemia 02/15/2011   Obesity 02/15/2011   Pure hypercholesterolemia 02/15/2011   Leishmaniasis, cutaneous, [place] 12/01/2010   Pulmonary embolism 11/28/2010   ACUTE PERFORATED APPENDICITIS WITH ABSCESS & PERITONITIS  -POD#2 s/p LAPAROSCOPIC APPENDECTOMY, LAPAROSCOPIC DRAINAGE OF ABSCESS 4/11 Dr. Michaell Cowing - Ileus as expected. Continue NPO/NGT to LIWS and await return in bowel function - mobilize - add scheduled IV tylenol and IV robaxin for improved pain control, IV dilaudid PRN - continue JP and monitor - currently serosanguinous - continue IV antibiotics - plan for 5d postop   ID - zosyn  FEN - IVF, NPO/NGT to LIWS VTE - SCDs, will discuss plan with hem/onc as below Foley - out and voiding   Hx DVT/PE on coumadin - reversed preop. INR 1.5 today. Ok to cont heparin  gtt today from surgical standpoint appreciate Dr. Myna Hidalgo recommendations  ABL anemia - Hgb 10.9 x2  from 12.6, VSS, repeat CBC in AM HLD HTN CAD Disposition: await return of bowel function, ambulate, cont NG tube; IV heparin   LOS: 3 days    Mary Sella. Andrey Campanile, MD, FACS General, Bariatric, & Minimally Invasive Surgery [phone] [facility]

## 2022-05-13 NOTE — Progress Notes (Signed)
Glenn Wheeler still has the NG tube in.  Hopefully, this will be able to come out today.  We did start him on heparin.  We cannot get him on Coumadin until the NG tube comes out.  The pathology on the appendix showed inflammation.  There is no malignancy.  He is walking.  He does have good bowel sounds.  There is no cough.  He has had no leg swelling.  There is no leg pain.  His labs show white cell count 6.9.  Hemoglobin 10.4.  Platelet count 144,000.  His BUN is 19 creatinine 1.1.  He has had no fever.  His vital signs show temperature 98.2.  Pulse 71.  Blood pressure 137/77.  His head and neck shows a NG tube in the left nares.  His lungs are clear bilaterally.  He has good air movement bilaterally.  Cardiac exam regular rate and rhythm.  Abdomen is soft.  Bowel sounds are present.  He has a laparoscopy scars that are healing.  There may be a little bit of distention.  He has no guarding or rebound tenderness.  Extremity shows no clubbing, cyanosis or edema.  Neurological exam is nonfocal.  Glenn Wheeler has recurrent thromboembolic disease.  He had acute appendicitis.  This is perforated.  He had surgery for this 2 days ago.  Hopefully, the NG tube will be able to come out.  Once it comes out then we can start him on Coumadin.  I know pharmacy is doing a fantastic job in managing all of this.  I do appreciate the great care he is getting from everybody on 3 E.  Christin Bach, MD  [place] 14:14

## 2022-05-13 NOTE — Progress Notes (Signed)
ANTICOAGULATION CONSULT NOTE - Initial Consult  Pharmacy Consult for heparin Indication:  history of PE/DVT on warfarin  No Known Allergies  Patient Measurements: Height: 6' (182.9 cm) Weight: 105.7 kg (233 lb 0.4 oz) IBW/kg (Calculated) : 77.6 Heparin Dosing Weight: 99 kg  Vital Signs: Temp: 97.9 F (36.6 C) (04/13 0745) Temp Source: Oral (04/13 0745) BP: 108/62 (04/13 0745) Pulse Rate: 68 (04/13 0745)  Labs: Recent Labs    05/10/22 1731 05/10/22 1731 05/11/22 0451 05/12/22 0546 05/12/22 1848 05/13/22 0057  HGB  --    < > 12.6* 10.9*  --  10.4*  HCT  --   --  37.6* 33.3*  --  31.5*  PLT  --   --  113* 131*  --  144*  LABPROT 25.4*  --  23.0* 18.0*  --  17.7*  INR 2.3*  --  2.1* 1.5*  --  1.5*  HEPARINUNFRC 0.52  --   --   --  0.14* 0.51  CREATININE  --   --   --  0.96  --  1.10   < > = values in this interval not displayed.     Estimated Creatinine Clearance: 80.7 mL/min (by C-G formula based on SCr of 1.1 mg/dL).   Medical History: Past Medical History:  Diagnosis Date   Chest pain    Colon polyps    2001, 2007   Exertional angina 07/22/2018   History of chicken pox    childhood   Hyperlipidemia    Leishmaniasis 05-2011   Pulmonary embolus     Medications: Warfarin PTA for history of recurrent VTE -Home dose: 10 mg PO daily. INR was therapeutic on admission -Last dose: 4/8  Assessment: Pt is a Glenn Wheeler who presented with abdominal pain. CT abdomen revealed severely enlarged/inflammed appendix concerning for appendicitis with possible perforation. CCS consulted. Pt received PO vitamin K for reversal of warfarin then underwent laparascopic appendectomy on 4/11 for acute perforated appendicitis with abscess and peritonitis.   Dr. Myna Hidalgo follows patient for recurrent VTE and placed warfarin consult. Pt currently on heparin drip peri-operatively. Pt has developed post-op ileus, remains NPO at this time. Per discussion with CCS, will need to continue to hold  warfarin until pt able to tolerate orals.    Significant Events: -4/10: Total of 7.5 mg PO vitamin K given. Heparin started. -4/11: 10 mg PO vitamin K given pre-op. Heparin held for 7 hours prior to surgery -4/12: Resumed heparin drip on POD#1  Today, 05/13/22 INR = 1.5 remains subtherapeutic s/p vitamin K and holding warfarin Confirmatory heparin level = 0.57 remains therapeutic on heparin infusion of 1600 units/hr CBC: Hgb and Plt both slightly low post-op, but stable  Goal of Therapy:  Heparin level 0.3-0.7 units/ml Monitor platelets by anticoagulation protocol: Yes   Plan:  Continue heparin infusion at 1600 units/hr CBC, heparin level daily Monitor for signs of bleeding Follow along for resolution of ileus and ability to resume warfarin. Patient will need to be bridged with UFH or LMWH once warfarin resumed.   Please let pharmacy know when to resume warfarin.  Cindi Carbon, PharmD 05/13/2022,9:24 AM

## 2022-05-13 NOTE — Progress Notes (Signed)
ANTICOAGULATION CONSULT NOTE   Pharmacy Consult for heparin, warfarin  Indication:  history of PE/DVT on warfarin  No Known Allergies  Patient Measurements: Height: 6' (182.9 cm) Weight: 105.7 kg (233 lb 0.4 oz) IBW/kg (Calculated) : 77.6 Heparin Dosing Weight: 99 kg   Medications: Warfarin PTA for history of recurrent VTE -Home dose: 10 mg PO daily. INR was therapeutic on admission -Last dose: 4/8  Infusions:   acetaminophen 1,000 mg (05/12/22 2356)   dextrose 5 % and 0.45% NaCl 75 mL/hr at 05/11/22 1452   heparin 1,600 Units/hr (05/12/22 1815)   lactated ringers     lactated ringers 10 mL/hr at 05/11/22 0942   methocarbamol (ROBAXIN) IV 500 mg (05/12/22 2027)   piperacillin-tazobactam (ZOSYN)  IV 3.375 g (05/12/22 2150)     Brief Assessment (see full note earlier today from Keturah Barre PharmD): Pt is a 32 yoM who presented with appendicitis s/p laparascopic appendectomy on 4/11 for acute perforated appendicitis with abscess and peritonitis.  PMH significant for recurrent VTE  on warfarin.  Pharmacy is consulted to dose heparin IV perioperatively while warfarin is held and NPO for ileus.    4/12 Warfarin per pharmacy dosing consult ordered by Dr. Myna Hidalgo, but he remains NPO with NG tube to suction.  CCS PA M.Maczis confirms that oral anticoagulation should be held until the patient is tolerating POs.  Timing for oral anticoagulation restart TBD.    05/13/22 Heparin level = 0.51 (therapeutic) with heparin gtt @ 1600 units/hr Hgb = 10.4, PLTC 144k No complications of therapy noted   Goal of Therapy:  Heparin level 0.3-0.7 units/ml Monitor platelets by anticoagulation protocol: Yes   Plan:  Continue heparin infusion at rate of 1600 units/hr Recheck heparin level in 8 hr confirm therapeutic dose CBC, heparin level daily Follow along for ability to resume warfarin   Terrilee Files, PharmD 05/13/2022 1:41 AM

## 2022-05-14 LAB — CBC
HCT: 32.3 % — ABNORMAL LOW (ref 39.0–52.0)
Hemoglobin: 10.5 g/dL — ABNORMAL LOW (ref 13.0–17.0)
MCH: 29.2 pg (ref 26.0–34.0)
MCHC: 32.5 g/dL (ref 30.0–36.0)
MCV: 90 fL (ref 80.0–100.0)
Platelets: 141 10*3/uL — ABNORMAL LOW (ref 150–400)
RBC: 3.59 MIL/uL — ABNORMAL LOW (ref 4.22–5.81)
RDW: 13.4 % (ref 11.5–15.5)
WBC: 4.2 10*3/uL (ref 4.0–10.5)
nRBC: 0 % (ref 0.0–0.2)

## 2022-05-14 LAB — PROTIME-INR
INR: 1.5 — ABNORMAL HIGH (ref 0.8–1.2)
Prothrombin Time: 17.6 seconds — ABNORMAL HIGH (ref 11.4–15.2)

## 2022-05-14 LAB — BASIC METABOLIC PANEL
Anion gap: 4 — ABNORMAL LOW (ref 5–15)
BUN: 14 mg/dL (ref 8–23)
CO2: 26 mmol/L (ref 22–32)
Calcium: 7.6 mg/dL — ABNORMAL LOW (ref 8.9–10.3)
Chloride: 107 mmol/L (ref 98–111)
Creatinine, Ser: 1.16 mg/dL (ref 0.61–1.24)
GFR, Estimated: >60 mL/min (ref >60)
Glucose, Bld: 137 mg/dL — ABNORMAL HIGH (ref 70–99)
Potassium: 3.5 mmol/L (ref 3.5–5.1)
Sodium: 137 mmol/L (ref 135–145)

## 2022-05-14 LAB — HEPARIN LEVEL (UNFRACTIONATED): Heparin Unfractionated: 0.65 IU/mL (ref 0.30–0.70)

## 2022-05-14 NOTE — Progress Notes (Signed)
ANTICOAGULATION CONSULT NOTE - Initial Consult  Pharmacy Consult for heparin Indication:  history of PE/DVT on warfarin  No Known Allergies  Patient Measurements: Height: 6' (182.9 cm) Weight: 105.7 kg (233 lb 0.4 oz) IBW/kg (Calculated) : 77.6 Heparin Dosing Weight: 99 kg  Vital Signs: Temp: 98.5 F (36.9 C) (04/14 0535) Temp Source: Oral (04/14 0535) BP: 129/74 (04/14 0535) Pulse Rate: 61 (04/14 0535)  Labs: Recent Labs    05/12/22 0546 05/12/22 1848 05/13/22 0057 05/13/22 0901 05/14/22 0522  HGB 10.9*  --  10.4*  --  10.5*  HCT 33.3*  --  31.5*  --  32.3*  PLT 131*  --  144*  --  141*  LABPROT 18.0*  --  17.7*  --  17.6*  INR 1.5*  --  1.5*  --  1.5*  HEPARINUNFRC  --    < > 0.51 0.57 0.65  CREATININE 0.96  --  1.10  --  1.16   < > = values in this interval not displayed.     Estimated Creatinine Clearance: 76.6 mL/min (by C-G formula based on SCr of 1.16 mg/dL).   Medical History: Past Medical History:  Diagnosis Date   Chest pain    Colon polyps    2001, 2007   Exertional angina 07/22/2018   History of chicken pox    childhood   Hyperlipidemia    Leishmaniasis 05-2011   Pulmonary embolus     Medications: Warfarin PTA for history of recurrent VTE -Home dose: 10 mg PO daily. INR was therapeutic on admission -Last dose: 4/8  Assessment: Pt is a 38 yoM who presented with abdominal pain. CT abdomen revealed severely enlarged/inflammed appendix concerning for appendicitis with possible perforation. CCS consulted. Pt received PO vitamin K for reversal of warfarin then underwent laparascopic appendectomy on 4/11 for acute perforated appendicitis with abscess and peritonitis. Pt developed post-op ileus.   Pt remains on heparin drip post-op. Dr. Myna Hidalgo follows patient for recurrent VTE and placed warfarin consult. Per CCS, need to continue to hold warfarin at this time until ileus resolves and NGT removed.   Significant Events: -4/10: Total of 7.5 mg PO  vitamin K given. Heparin started. -4/11: 10 mg PO vitamin K given pre-op. Heparin held for 7 hours prior to surgery -4/12: Resumed heparin drip on POD#1  Today, 05/14/22 INR = 1.5 remains subtherapeutic s/p vitamin K and holding warfarin Heparin level = 0.65 remains therapeutic on heparin infusion of 1600 units/hr CBC: Hgb and Plt both slightly low post-op, but stable Confirmed with RN - no signs of bleeding.   Goal of Therapy:  Heparin level 0.3-0.7 units/ml Monitor platelets by anticoagulation protocol: Yes   Plan:  Continue heparin infusion at 1600 units/hr CBC, heparin level daily Monitor for signs of bleeding Follow along for resolution of ileus and ability to resume warfarin. Patient will need to be bridged with UFH or LMWH once warfarin resumed.   Please let pharmacy know when to resume warfarin.  Cindi Carbon, PharmD 05/14/2022,9:03 AM

## 2022-05-14 NOTE — Progress Notes (Signed)
Patient ID: JACOBS TISE, male   DOB: [birth year], 67 y.o.   MRN: [id number]   Acute Care Surgery Service Progress Note:    Chief Complaint/Subjective: Wife at [facility]  + flatus 100cc/NG past 24hrs Ambulated   Objective: Vital signs in last 24 hours: Temp:  [97.6 F (36.4 C)-98.5 F (36.9 C)] 98.5 F (36.9 C) (04/14 0535) Pulse Rate:  [61-68] 61 (04/14 0535) Resp:  [16-18] 16 (04/14 0535) BP: (115-129)/(66-75) 129/74 (04/14 0535) SpO2:  [96 %-98 %] 96 % (04/14 0535) Last BM Date : 05/09/22  Intake/Output from previous day: 04/13 0701 - 04/14 0700 In: 1309 [I.V.:685; IV Piggyback:624] Out: 1190 [Urine:1000; Emesis/NG output:100; Drains:90] Intake/Output this shift: No intake/output data recorded.  Lungs: , nonlabored  Cardiovascular: reg  Abd: soft, min TTP, incisions ok, JP -bloody; not really distended  Extremities: no edema, +SCDs  Neuro: alert, nonfocal  Lab Results: CBC  Recent Labs    05/13/22 0057 05/14/22 0522  WBC 6.9 4.2  HGB 10.4* 10.5*  HCT 31.5* 32.3*  PLT 144* 141*    BMET Recent Labs    05/13/22 0057 05/14/22 0522  NA 137 137  K 3.5 3.5  CL 105 107  CO2 24 26  GLUCOSE 156* 137*  BUN 19 14  CREATININE 1.10 1.16  CALCIUM 7.4* 7.6*    LFT    Latest Ref Rng & Units 05/09/2022    6:18 PM 05/05/2022    9:31 AM 04/05/2022    2:13 PM  Hepatic Function  Total Protein 6.5 - 8.1 g/dL 7.0  6.7  6.9   Albumin 3.5 - 5.0 g/dL 3.7  3.8  4.4   AST 15 - 41 U/L 32  33  35   ALT 0 - 44 U/L 29  35  31   Alk Phosphatase 38 - 126 U/L 81  84  74   Total Bilirubin 0.3 - 1.2 mg/dL 1.2  0.5  0.7    PT/INR Recent Labs    05/13/22 0057 05/14/22 0522  LABPROT 17.7* 17.6*  INR 1.5* 1.5*    ABG No results for input(s): "PHART", "HCO3" in the last 72 hours.  Invalid input(s): "PCO2", "PO2"  Studies/Results:  Anti-infectives: Anti-infectives (From admission, onward)    Start     Dose/Rate Route Frequency Ordered Stop   05/11/22 0843  metroNIDAZOLE  (FLAGYL) 500 MG/100ML IVPB       Note to Pharmacy: Vevelyn Royals D: cabinet override      05/11/22 0843 05/11/22 0958   05/11/22 0600  cefTRIAXone (ROCEPHIN) 2 g in sodium chloride 0.9 % 100 mL IVPB       See Hyperspace for full Linked Orders Report.   2 g 200 mL/hr over 30 Minutes Intravenous On call to O.R. 05/10/22 1131 05/11/22 1019   05/11/22 0600  metroNIDAZOLE (FLAGYL) IVPB 500 mg       See Hyperspace for full Linked Orders Report.   500 mg 100 mL/hr over 60 Minutes Intravenous On call to O.R. 05/10/22 1131 05/11/22 1042   05/10/22 0600  piperacillin-tazobactam (ZOSYN) IVPB 3.375 g       See Hyperspace for full Linked Orders Report.   3.375 g 12.5 mL/hr over 240 Minutes Intravenous Every 8 hours 05/09/22 2138     05/09/22 2145  piperacillin-tazobactam (ZOSYN) IVPB 3.375 g       See Hyperspace for full Linked Orders Report.   3.375 g 100 mL/hr over 30 Minutes Intravenous  Once 05/09/22 2138 05/09/22 2208   05/09/22  2130  piperacillin-tazobactam (ZOSYN) IVPB 3.375 g  Status:  Discontinued        3.375 g 12.5 mL/hr over 240 Minutes Intravenous Every 8 hours 05/09/22 2107 05/09/22 2138       Medications: Scheduled Meds:  sodium chloride   Intravenous Once   sodium chloride   Intravenous Once   lip balm   Topical BID   metoprolol succinate  25 mg Oral QHS   Continuous Infusions:  dextrose 5 % and 0.45% NaCl 75 mL/hr at 05/13/22 2243   heparin 1,600 Units/hr (05/13/22 1924)   lactated ringers 10 mL/hr at 05/11/22 1610   methocarbamol (ROBAXIN) IV 500 mg (05/14/22 0555)   piperacillin-tazobactam (ZOSYN)  IV 3.375 g (05/14/22 0508)   PRN Meds:.acetaminophen **OR** acetaminophen, alum & mag hydroxide-simeth, bisacodyl, diphenhydrAMINE **OR** diphenhydrAMINE, HYDROmorphone (DILAUDID) injection, magic mouthwash, menthol-cetylpyridinium, metoprolol tartrate, ondansetron **OR** ondansetron (ZOFRAN) IV, phenol, zolpidem  Assessment/Plan: Patient Active Problem List    Diagnosis Date Noted   Acute appendicitis 05/10/2022   Chronic anticoagulation 05/10/2022   Family history of CVA 05/10/2022   History of DVT (deep vein thrombosis) 05/10/2022   Acute perforated appendicitis 05/09/2022   Actinic keratosis 04/12/2021   Prediabetes 04/12/2021   Coronary artery disease involving native coronary artery of native heart without angina pectoris 07/26/2018   Abnormal stress test 07/19/2018   NSVT (nonsustained ventricular tachycardia) 07/19/2018   Thrombocytopathia 07/19/2018   Exertional chest pain 06/20/2018   Palpitations 06/20/2018   H/O mitral valve prolapse 06/20/2018   Venous insufficiency of right lower extremity 01/16/2014   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 06/06/2013   Tremor 06/05/2013   Abnormal involuntary movement 06/05/2013   Leishmaniasis, mucocutaneous 04/14/2013   History of pulmonary embolism 04/14/2013   Hypogonadism male 01/12/2012   Hx of colonic polyps 01/08/2012   Erectile dysfunction 12/03/2011   Myalgia 08/18/2011   External hemorrhoid 08/18/2011   Left lateral epicondylitis 04/08/2011   Mixed hyperlipidemia 02/15/2011   Obesity 02/15/2011   Pure hypercholesterolemia 02/15/2011   Leishmaniasis, cutaneous, [place] 12/01/2010   Pulmonary embolism 11/28/2010   ACUTE PERFORATED APPENDICITIS WITH ABSCESS & PERITONITIS  -POD#3 s/p LAPAROSCOPIC APPENDECTOMY, LAPAROSCOPIC DRAINAGE OF ABSCESS 4/11 Dr. Michaell Cowing - Ileus as expected. - mobilize -cont scheduled IV tylenol and IV robaxin for improved pain control, IV dilaudid PRN - continue JP and monitor - currently serosanguinous - continue IV antibiotics - plan for 5d postop   ID - zosyn  FEN - IVF, clamp NG, allow CLD VTE - SCDs, will discuss plan with hem/onc as below Foley - out and voiding   Hx DVT/PE on coumadin - reversed preop. INR 1.5 today. Ok to cont heparin gtt today from surgical standpoint appreciate Dr. Myna Hidalgo recommendations  ABL anemia - Hgb 10 x3  from 12.6, VSS, repeat  CBC in AM HLD HTN CAD Disposition: clamp NG tube, allow CLD, cont IV heparin- would hold off on coumadin until we formally remove NG which will hopefully be in am if pt tolerates clamping trial and clears   LOS: 4 days    Mary Sella. Andrey Campanile, MD, FACS General, Bariatric, & Minimally Invasive Surgery [phone] [facility]

## 2022-05-15 DIAGNOSIS — K3532 Acute appendicitis with perforation and localized peritonitis, without abscess: Secondary | ICD-10-CM | POA: Diagnosis not present

## 2022-05-15 LAB — BASIC METABOLIC PANEL
Anion gap: 8 (ref 5–15)
BUN: 10 mg/dL (ref 8–23)
CO2: 24 mmol/L (ref 22–32)
Calcium: 8 mg/dL — ABNORMAL LOW (ref 8.9–10.3)
Chloride: 105 mmol/L (ref 98–111)
Creatinine, Ser: 1.05 mg/dL (ref 0.61–1.24)
GFR, Estimated: >60 mL/min (ref >60)
Glucose, Bld: 134 mg/dL — ABNORMAL HIGH (ref 70–99)
Potassium: 3.4 mmol/L — ABNORMAL LOW (ref 3.5–5.1)
Sodium: 137 mmol/L (ref 135–145)

## 2022-05-15 LAB — CBC
HCT: 33.6 % — ABNORMAL LOW (ref 39.0–52.0)
Hemoglobin: 11.1 g/dL — ABNORMAL LOW (ref 13.0–17.0)
MCH: 29.5 pg (ref 26.0–34.0)
MCHC: 33 g/dL (ref 30.0–36.0)
MCV: 89.4 fL (ref 80.0–100.0)
Platelets: 159 10*3/uL (ref 150–400)
RBC: 3.76 MIL/uL — ABNORMAL LOW (ref 4.22–5.81)
RDW: 13.2 % (ref 11.5–15.5)
WBC: 5.1 10*3/uL (ref 4.0–10.5)
nRBC: 0 % (ref 0.0–0.2)

## 2022-05-15 LAB — PROTIME-INR
INR: 1.4 — ABNORMAL HIGH (ref 0.8–1.2)
Prothrombin Time: 17.2 seconds — ABNORMAL HIGH (ref 11.4–15.2)

## 2022-05-15 LAB — HEPARIN LEVEL (UNFRACTIONATED): Heparin Unfractionated: 0.53 IU/mL (ref 0.30–0.70)

## 2022-05-15 LAB — MAGNESIUM: Magnesium: 2.2 mg/dL (ref 1.7–2.4)

## 2022-05-15 MED ORDER — WARFARIN - PHARMACIST DOSING INPATIENT: Freq: Every day | Status: DC

## 2022-05-15 MED ORDER — WARFARIN SODIUM 2.5 MG PO TABS
10.0000 mg | ORAL_TABLET | Freq: Once | ORAL | Status: AC
  Administered 2022-05-15: 10 mg via ORAL
  Filled 2022-05-15: qty 4

## 2022-05-15 NOTE — Progress Notes (Signed)
4 Days Post-Op   Subjective/Chief Complaint: No complaints. Feels good. Passing flatus   Objective: Vital signs in last 24 hours: Temp:  [97.7 F (36.5 C)-98.2 F (36.8 C)] 98.2 F (36.8 C) (04/15 0600) Pulse Rate:  [66-72] 66 (04/15 0600) Resp:  [16-18] 18 (04/15 0600) BP: (133-147)/(66-85) 147/85 (04/15 0600) SpO2:  [94 %-98 %] 97 % (04/15 0600) Last BM Date : 05/09/22  Intake/Output from previous day: 04/14 0701 - 04/15 0700 In: 2432.5 [P.O.:500; I.V.:1487.5; NG/GT:75; IV Piggyback:370] Out: 2149 [Urine:2125; Drains:24] Intake/Output this shift: Total I/O In: 0  Out: 70 [Drains:70]  General appearance: alert and cooperative Resp: clear to auscultation bilaterally Cardio: regular rate and rhythm GI: soft, nontender. Good bs  Lab Results:  Recent Labs    05/14/22 0522 05/15/22 0457  WBC 4.2 5.1  HGB 10.5* 11.1*  HCT 32.3* 33.6*  PLT 141* 159   BMET Recent Labs    05/14/22 0522 05/15/22 0457  NA 137 137  K 3.5 3.4*  CL 107 105  CO2 26 24  GLUCOSE 137* 134*  BUN 14 10  CREATININE 1.16 1.05  CALCIUM 7.6* 8.0*   PT/INR Recent Labs    05/14/22 0522 05/15/22 0457  LABPROT 17.6* 17.2*  INR 1.5* 1.4*   ABG No results for input(s): "PHART", "HCO3" in the last 72 hours.  Invalid input(s): "PCO2", "PO2"  Studies/Results: No results found.  Anti-infectives: Anti-infectives (From admission, onward)    Start     Dose/Rate Route Frequency Ordered Stop   05/11/22 0843  metroNIDAZOLE (FLAGYL) 500 MG/100ML IVPB       Note to Pharmacy: Vevelyn Royals D: cabinet override      05/11/22 0843 05/11/22 0958   05/11/22 0600  cefTRIAXone (ROCEPHIN) 2 g in sodium chloride 0.9 % 100 mL IVPB       See Hyperspace for full Linked Orders Report.   2 g 200 mL/hr over 30 Minutes Intravenous On call to O.R. 05/10/22 1131 05/11/22 1019   05/11/22 0600  metroNIDAZOLE (FLAGYL) IVPB 500 mg       See Hyperspace for full Linked Orders Report.   500 mg 100 mL/hr over  60 Minutes Intravenous On call to O.R. 05/10/22 1131 05/11/22 1042   05/10/22 0600  piperacillin-tazobactam (ZOSYN) IVPB 3.375 g       See Hyperspace for full Linked Orders Report.   3.375 g 12.5 mL/hr over 240 Minutes Intravenous Every 8 hours 05/09/22 2138     05/09/22 2145  piperacillin-tazobactam (ZOSYN) IVPB 3.375 g       See Hyperspace for full Linked Orders Report.   3.375 g 100 mL/hr over 30 Minutes Intravenous  Once 05/09/22 2138 05/09/22 2208   05/09/22 2130  piperacillin-tazobactam (ZOSYN) IVPB 3.375 g  Status:  Discontinued        3.375 g 12.5 mL/hr over 240 Minutes Intravenous Every 8 hours 05/09/22 2107 05/09/22 2138       Assessment/Plan: s/p Procedure(s): APPENDECTOMY LAPAROSCOPIC (N/A) Advance diet. Allow fulls today D/c ng ACUTE PERFORATED APPENDICITIS WITH ABSCESS & PERITONITIS  -POD#4 s/p LAPAROSCOPIC APPENDECTOMY, LAPAROSCOPIC DRAINAGE OF ABSCESS 4/11 Dr. Michaell Cowing - Ileus as expected. - mobilize -cont scheduled IV tylenol and IV robaxin for improved pain control, IV dilaudid PRN - continue JP and monitor - currently serosanguinous - continue IV antibiotics - plan for 5d postop   ID - zosyn  FEN - IVF, clamp NG, allow CLD VTE - SCDs, will discuss plan with hem/onc as below Foley - out and voiding  Hx DVT/PE on coumadin - reversed preop. INR 1.5 today. Ok to cont heparin gtt today from surgical standpoint appreciate Dr. Myna Hidalgo recommendations  ABL anemia - Hgb 10 x3  from 12.6, VSS, repeat CBC in AM HLD HTN CAD  LOS: 5 days    Glenn Wheeler 05/15/2022

## 2022-05-15 NOTE — Progress Notes (Signed)
The NG tube is still in.  He does have an ileus.  Hopefully, the NG tube will come out.  Is clamped right now.  Is having some clear liquids.  He is on heparin.  We cannot start the Coumadin until his NG tube was.  There is been no problems with bleeding.  He has had no pain outside of that associate with surgery.  He has had no leg swelling other than maybe a little bit of mild chronic leg swelling on the right.  He has had no fever.  He has had no cough.  His white cell count is 5.1.  Hemoglobin 11.1.  Platelet count 159,000.  BUN is 10 creatinine 1.05.  His vital signs are temperature 98.2.  Pulse 65.  Blood pressure 147/85.  His head neck exam shows no ocular or oral lesions.  He has an NG tube in.  Lungs are clear bilaterally.  Cardiac exam regular rate and rhythm.  Abdomen is soft.  Bowel sounds are decreased but active.  There is no fluid wave.  There is no palpable liver or spleen tip.  He has the laparoscopy scars.  They are healing.  Extremity shows no clubbing, cyanosis or edema.  No venous cord is palpable.  Neurological exam is nonfocal.   Glenn Wheeler has the perforated appendix.  He has surgery for this a week ago.  He had an ileus that was postop.  He has an NG tube in.  Hopefully this will come out.  He has IV heparin going.  We will continue the heparin and get him on Coumadin.  I would think that given the fact that he has not eaten that Coumadin should become therapeutic in 3 to 4 days.  I do appreciate everybody's help with Glenn Wheeler.  Christin Bach, MD  Romans 1:16

## 2022-05-15 NOTE — Anesthesia Postprocedure Evaluation (Signed)
Anesthesia Post Note  Patient: KACETON JHA  Procedure(s) Performed: APPENDECTOMY LAPAROSCOPIC     Patient location during evaluation: Other Anesthesia Type: General Level of consciousness: awake and alert Pain management: pain level controlled Vital Signs Assessment: post-procedure vital signs reviewed and stable Respiratory status: spontaneous breathing, nonlabored ventilation and respiratory function stable Cardiovascular status: blood pressure returned to baseline and stable Postop Assessment: no apparent nausea or vomiting Anesthetic complications: no  No notable events documented.  Last Vitals:  Vitals:   05/14/22 2049 05/15/22 0600  BP: (!) 140/66 (!) 147/85  Pulse: 72 66  Resp: 18 18  Temp: 36.7 C 36.8 C  SpO2: 94% 97%    Last Pain:  Vitals:   05/15/22 0600  TempSrc: Oral  PainSc:                  Trystin Terhune,W. EDMOND

## 2022-05-15 NOTE — Progress Notes (Addendum)
ANTICOAGULATION CONSULT NOTE - Follow Up Consult  Pharmacy Consult for Heparin Indication:  h/o DVTs and PE's  No Known Allergies  Patient Measurements: Height: 6' (182.9 cm) Weight: 105.7 kg (233 lb 0.4 oz) IBW/kg (Calculated) : 77.6 Heparin Dosing Weight: 99.6 kg  Vital Signs: Temp: 98.2 F (36.8 C) (04/15 0600) Temp Source: Oral (04/15 0600) BP: 147/85 (04/15 0600) Pulse Rate: 66 (04/15 0600)  Labs: Recent Labs    05/13/22 0057 05/13/22 0901 05/14/22 0522 05/15/22 0457  HGB 10.4*  --  10.5* 11.1*  HCT 31.5*  --  32.3* 33.6*  PLT 144*  --  141* 159  LABPROT 17.7*  --  17.6* 17.2*  INR 1.5*  --  1.5* 1.4*  HEPARINUNFRC 0.51 0.57 0.65 0.53  CREATININE 1.10  --  1.16 1.05    Estimated Creatinine Clearance: 84.6 mL/min (by C-G formula based on SCr of 1.05 mg/dL).   Assessment:  AC/Heme: Warfarin PTA for recurrent PE/DVT -4/10: 7.5 mg PO vitamin K, Ennever wanted heparin to infuse despite therapeutic INR (INR 2.3) in anticipation of INR drop s/p vitamin K - 4/11 10 mg PO vitamin K pre-op - 4/12: Resumed IV heparin POD1 - 4/15: HL 0.53, INR 1.4. Hgb up to 11.1. Plts 159 up  Goal of Therapy:  Heparin level 0.3-0.7 units/ml Monitor platelets by anticoagulation protocol: Yes   Plan:  - Con't IV heparin 1600 units/hr. Daily CBC and HL. -Will need to discharge on LMWH bridge. -Hopeful to resume warfarin 4/15 if NG out. Need CCS clearance.   Adden: Consult for warfarin per pharmacy: - Coumadin 10mg  po x 1 today. Daily INR   Eshaan Titzer S. Merilynn Finland, PharmD, BCPS Clinical Staff Pharmacist [email or web address] Merilynn Finland, Teyonna Plaisted Stillinger 05/15/2022,8:21 AM

## 2022-05-16 ENCOUNTER — Encounter: Payer: Self-pay | Admitting: Hematology & Oncology

## 2022-05-16 ENCOUNTER — Other Ambulatory Visit: Payer: Self-pay

## 2022-05-16 DIAGNOSIS — K3532 Acute appendicitis with perforation and localized peritonitis, without abscess: Secondary | ICD-10-CM | POA: Diagnosis not present

## 2022-05-16 DIAGNOSIS — I824Z1 Acute embolism and thrombosis of unspecified deep veins of right distal lower extremity: Secondary | ICD-10-CM

## 2022-05-16 LAB — CBC
HCT: 32.6 % — ABNORMAL LOW (ref 39.0–52.0)
Hemoglobin: 10.8 g/dL — ABNORMAL LOW (ref 13.0–17.0)
MCH: 30 pg (ref 26.0–34.0)
MCHC: 33.1 g/dL (ref 30.0–36.0)
MCV: 90.6 fL (ref 80.0–100.0)
Platelets: 164 10*3/uL (ref 150–400)
RBC: 3.6 MIL/uL — ABNORMAL LOW (ref 4.22–5.81)
RDW: 13.6 % (ref 11.5–15.5)
WBC: 5.3 10*3/uL (ref 4.0–10.5)
nRBC: 0 % (ref 0.0–0.2)

## 2022-05-16 LAB — HEPARIN LEVEL (UNFRACTIONATED): Heparin Unfractionated: 0.66 IU/mL (ref 0.30–0.70)

## 2022-05-16 LAB — PROTIME-INR
INR: 1.5 — ABNORMAL HIGH (ref 0.8–1.2)
Prothrombin Time: 18 seconds — ABNORMAL HIGH (ref 11.4–15.2)

## 2022-05-16 MED ORDER — METHOCARBAMOL 500 MG PO TABS: 500.0000 mg | ORAL_TABLET | Freq: Four times a day (QID) | ORAL | 0 refills | Status: DC | PRN

## 2022-05-16 MED ORDER — POLYETHYLENE GLYCOL 3350 17 G PO PACK: 17.0000 g | PACK | Freq: Every day | ORAL | 0 refills | Status: DC | PRN

## 2022-05-16 MED ORDER — ENOXAPARIN SODIUM 100 MG/ML IJ SOSY: 100.0000 mg | PREFILLED_SYRINGE | Freq: Two times a day (BID) | INTRAMUSCULAR | 0 refills | Status: DC

## 2022-05-16 MED ORDER — ENOXAPARIN SODIUM 100 MG/ML IJ SOSY
100.0000 mg | PREFILLED_SYRINGE | Freq: Two times a day (BID) | INTRAMUSCULAR | Status: DC
  Administered 2022-05-16: 100 mg via SUBCUTANEOUS
  Filled 2022-05-16 (×2): qty 1

## 2022-05-16 MED ORDER — WARFARIN SODIUM 10 MG PO TABS: 10.0000 mg | ORAL_TABLET | Freq: Every day | ORAL | 0 refills | Status: DC

## 2022-05-16 MED ORDER — WARFARIN SODIUM 6 MG PO TABS
12.0000 mg | ORAL_TABLET | Freq: Once | ORAL | Status: AC
  Administered 2022-05-16: 12 mg via ORAL
  Filled 2022-05-16: qty 2

## 2022-05-16 NOTE — Progress Notes (Signed)
The NG tube Foley came out.  He feels a lot better.  He is having bowel movements.  He is having some full liquids.  He does not have any problems with increased pain.  He is out of bed.  He is on heparin.  Started the Coumadin.  Pharmacy is helping [place] with the Coumadin.  Not surprisingly, his INR is only 1.5.  I would think though that the INR should come up quickly since he really has not had much seeing our think that vitamin K should be on the lower side.  I am not sure along surgery wants to keep him in the hospital.  As such, we may have to think about getting him onto Lovenox.  He has had no cough or shortness of breath.  There is no chest wall pain.  Has had a little bit of leg swelling but this is more chronic.  He has had no fever.  There is been no obvious bleeding.  Said that he did have some bright red blood per rectum.  This may have been some hemorrhoidal bleeding.  His vital signs are temperature 98.3.  Pulse 58.  Blood pressure 123/77.  Head neck exam shows no ocular or oral lesions.  He has no adenopathy in the neck.  Lungs are clear.  He has good breath sounds bilaterally.  Cardiac exam regular rate and rhythm.  Abdomen is soft.  Bowel sounds are definitely present.  There may be little bit of distention.  Has healing laparoscopy scars.  Extremities shows maybe some slight edema bilaterally.  No venous cords noted in the legs.  He now is the taking full liquids.  I would think that he might be able to be discharged in a day or so.  We probably should consider converting him over to Lovenox now.  Again he may need Lovenox as an outpatient until his INR is therapeutic.  I like to have his INR between 2.5-3.5.  He is also getting great care from everybody up on 3 E.   Christin Bach, MD  [facility] [phone]

## 2022-05-16 NOTE — Discharge Summary (Signed)
[facility] Discharge Summary   Patient ID: Glenn Wheeler MRN: [id number] DOB/AGE: 69-May-1957 69 y.o.  Admit date: 05/09/2022 Discharge date: 05/16/2022  Admitting Diagnosis: Acute appendicitis with possible perforation   Discharge Diagnosis Patient Active Problem List   Diagnosis Date Noted   Acute appendicitis with perforation and abscess 05/10/2022   Chronic anticoagulation 05/10/2022   Family history of CVA 05/10/2022   History of DVT (deep vein thrombosis) 05/10/2022   Acute perforated appendicitis 05/09/2022   Actinic keratosis 04/12/2021   Prediabetes 04/12/2021   Coronary artery disease involving native coronary artery of native heart without angina pectoris 07/26/2018   Abnormal stress test 07/19/2018   NSVT (nonsustained ventricular tachycardia) 07/19/2018   Thrombocytopathia 07/19/2018   Exertional chest pain 06/20/2018   Palpitations 06/20/2018   H/O mitral valve prolapse 06/20/2018   Venous insufficiency of right lower extremity 01/16/2014   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  Procedures Dr. Michaell Cowing (05/11/2022) - Laparoscopic Appendectomy, LAPAROSCOPIC DRAINAGE OF ABSCESS   Hospital Course:  Glenn Wheeler is a 67 y.o. 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  Workup showed acute  appendicitis with possible perforation. Patient was admitted and started on IV antibiotics. Initial plan was to slowly reverse anticoagulation, but with ongoing fever and leukocytosis the decision was make to give FFP and proceed with surgery. Intraoperatively he was found to have acute perforated appendicitis with abscess and peritonitis. Patient underwent procedure listed above; a drain was left in the pelvis.  Tolerated procedure well and was transferred to the floor.  Patient did have an ileus postoperatively as expected. Once bowel function returned the NG tube was removed and diet advanced as tolerated. Patient was kept on IV heparin for anticoagulation while NPO, then transitioned back to coumadin with a lovenox bridge once taking PO. JP drain output remained serous and was removed on day of discharge. He completed 5 days of zosyn postoperatively. On POD5, the patient was voiding well, tolerating diet, having bowel function, ambulating well, pain well controlled, vital signs stable, incisions c/d/i and felt stable for discharge home.  Patient will follow up as below and knows to call with questions or concerns.      Allergies as of 05/16/2022   No Known Allergies      Medication List     TAKE these medications    atorvastatin 40 MG tablet Commonly known as: LIPITOR Take 1 tablet (40 mg total) by mouth at bedtime.   enoxaparin 100 MG/ML injection Commonly known as: Lovenox Inject 1 mL (100 mg total) into the skin every 12 (twelve) hours for 5 days.   methocarbamol 500 MG tablet Commonly known as: ROBAXIN Take 1 tablet (500 mg total) by mouth every 6 (six) hours as needed for muscle spasms.   metoprolol succinate 25 MG 24 hr tablet Commonly known as: TOPROL-XL TAKE 1 TABLET DAILY What changed: when to  take this   multivitamin with minerals tablet Take 1 tablet by mouth at bedtime.   nitroGLYCERIN 0.4 MG SL tablet Commonly known as: NITROSTAT Place 1 tablet (0.4 mg total) under  the tongue every 5 (five) minutes as needed for chest pain.   polyethylene glycol 17 g packet Commonly known as: MiraLax Take 17 g by mouth daily as needed for mild constipation.   sildenafil 20 MG tablet Commonly known as: REVATIO Take 20 mg by mouth as needed (erectile disfunction).   TYLENOL PO Take 1 tablet by mouth as needed (pain).   warfarin 10 MG tablet Commonly known as: COUMADIN Take as directed. If you are unsure how to take this medication, talk to your nurse or doctor. Original instructions: Take 1 tablet (10 mg total) by mouth daily for 5 days. What changed:  when to take this Another medication with the same name was removed. Continue taking this medication, and follow the directions you see here.          Follow-up Information     Maczis, Hedda Slade, PA-C Follow up on 06/06/2022.   Specialty: General Surgery Why: 830am. Please arrive 30 minutes prior to your appointment for paperwork. Please bring a copy of your photo ID and insurance card. Contact information: [address] [place] [address] [phone]         [name], MD. Go on 05/19/2022.   Specialty: Oncology Why: To follow up regarding coumadin/lovenox bridge Contact information: [address] [place] [address] [phone]                  Signed: Franne Forts, [facility] 05/16/2022, 3:48 PM Please see Amion for pager number during day hours 7:00am-4:30pm

## 2022-05-16 NOTE — Plan of Care (Signed)

## 2022-05-16 NOTE — Progress Notes (Signed)
ANTICOAGULATION CONSULT NOTE - Initial Consult  Pharmacy Consult for Lovenox, Coumadin Indication:  h/o VTE  No Known Allergies  Patient Measurements: Height: 6' (182.9 cm) Weight: 105.7 kg (233 lb 0.4 oz) IBW/kg (Calculated) : 77.6  Vital Signs: Temp: 98.3 F (36.8 C) (04/16 0528) Temp Source: Oral (04/16 0528) BP: 123/77 (04/16 0528) Pulse Rate: 58 (04/16 0528)  Labs: Recent Labs    05/14/22 0522 05/15/22 0457 05/16/22 0454  HGB 10.5* 11.1* 10.8*  HCT 32.3* 33.6* 32.6*  PLT 141* 159 164  LABPROT 17.6* 17.2* 18.0*  INR 1.5* 1.4* 1.5*  HEPARINUNFRC 0.65 0.53 0.66  CREATININE 1.16 1.05  --     Estimated Creatinine Clearance: 84.6 mL/min (by C-G formula based on SCr of 1.05 mg/dL).   Medical History: Past Medical History:  Diagnosis Date   Chest pain    Colon polyps    2001, 2007   Exertional angina 07/22/2018   History of chicken pox    childhood   Hyperlipidemia    Leishmaniasis 05-2011   Pulmonary embolus    Assessment:  AC/Heme: Warfarin PTA ( /d) for recurrent PE/DVT -4/10: 7.5 mg PO vitamin K, Ennever wanted heparin to infuse despite therapeutic INR (INR 2.3) in anticipation of INR drop s/p vitamin K - 4/11 10 mg PO vitamin K pre-op - 4/12: Resumed IV heparin POD1 - 4/15: HL 0.53, INR 1.4. Hgb up to 11.1. Plts 159 up. Resume warfarin per CCS,  po x 1 today. - 4/16: HL 0.66 in goal. Hgb 10.8 stable. Plts 164 up. INR 1.5. Change IV Hep to Lovenox in preparation for discharge.  Goal of Therapy:  INR 2.5-3.5 per Ennever Monitor platelets by anticoagulation protocol: Yes   Plan:  D/c IV heparin Lovenox  SQ BID Increase Coumadin 12 mg po x 1 tonight Daily INR, CBC q72h   Cai Anfinson S. Merilynn Finland, PharmD, BCPS Clinical Staff Pharmacist [email or web address] Merilynn Finland, Magan Winnett Stillinger 05/16/2022,7:17 AM

## 2022-05-16 NOTE — Progress Notes (Signed)
Lab only PT/INR Friday per MD

## 2022-05-16 NOTE — Progress Notes (Signed)
Patient was given discharge instructions. All questions were answered. Transported via wheelchair to main entrance.

## 2022-05-16 NOTE — Progress Notes (Signed)
5 Days Post-Op   Subjective/Chief Complaint: Feels better. No complaints   Objective: Vital signs in last 24 hours: Temp:  [98.3 F (36.8 C)-98.6 F (37 C)] 98.3 F (36.8 C) (04/16 0528) Pulse Rate:  [58-70] 58 (04/16 0528) Resp:  [18] 18 (04/16 0528) BP: (123-143)/(77-79) 123/77 (04/16 0528) SpO2:  [97 %-98 %] 97 % (04/16 0528) Last BM Date : 05/15/22  Intake/Output from previous day: 04/15 0701 - 04/16 0700 In: 4006.6 [P.O.:1438; I.V.:2078.2; IV Piggyback:490.4] Out: 170 [Drains:170] Intake/Output this shift: No intake/output data recorded.  General appearance: alert and cooperative Resp: clear to auscultation bilaterally Cardio: regular rate and rhythm GI: soft, minimal tenderness. Drain output serosanguinous  Lab Results:  Recent Labs    05/15/22 0457 05/16/22 0454  WBC 5.1 5.3  HGB 11.1* 10.8*  HCT 33.6* 32.6*  PLT 159 164   BMET Recent Labs    05/14/22 0522 05/15/22 0457  NA 137 137  K 3.5 3.4*  CL 107 105  CO2 26 24  GLUCOSE 137* 134*  BUN 14 10  CREATININE 1.16 1.05  CALCIUM 7.6* 8.0*   PT/INR Recent Labs    05/15/22 0457 05/16/22 0454  LABPROT 17.2* 18.0*  INR 1.4* 1.5*   ABG No results for input(s): "PHART", "HCO3" in the last 72 hours.  Invalid input(s): "PCO2", "PO2"  Studies/Results: No results found.  Anti-infectives: Anti-infectives (From admission, onward)    Start     Dose/Rate Route Frequency Ordered Stop   05/11/22 0843  metroNIDAZOLE (FLAGYL) 500 MG/100ML IVPB       Note to Pharmacy: Vevelyn Royals D: cabinet override      05/11/22 0843 05/11/22 0958   05/11/22 0600  cefTRIAXone (ROCEPHIN) 2 g in sodium chloride 0.9 % 100 mL IVPB       See Hyperspace for full Linked Orders Report.   2 g 200 mL/hr over 30 Minutes Intravenous On call to O.R. 05/10/22 1131 05/11/22 1019   05/11/22 0600  metroNIDAZOLE (FLAGYL) IVPB 500 mg       See Hyperspace for full Linked Orders Report.   500 mg 100 mL/hr over 60 Minutes  Intravenous On call to O.R. 05/10/22 1131 05/11/22 1042   05/10/22 0600  piperacillin-tazobactam (ZOSYN) IVPB 3.375 g       See Hyperspace for full Linked Orders Report.   3.375 g 12.5 mL/hr over 240 Minutes Intravenous Every 8 hours 05/09/22 2138 05/16/22 2359   05/09/22 2145  piperacillin-tazobactam (ZOSYN) IVPB 3.375 g       See Hyperspace for full Linked Orders Report.   3.375 g 100 mL/hr over 30 Minutes Intravenous  Once 05/09/22 2138 05/09/22 2208   05/09/22 2130  piperacillin-tazobactam (ZOSYN) IVPB 3.375 g  Status:  Discontinued        3.375 g 12.5 mL/hr over 240 Minutes Intravenous Every 8 hours 05/09/22 2107 05/09/22 2138       Assessment/Plan: s/p Procedure(s): APPENDECTOMY LAPAROSCOPIC (N/A) Advance diet. Allow soft food today ACUTE PERFORATED APPENDICITIS WITH ABSCESS & PERITONITIS  -POD#5 s/p LAPAROSCOPIC APPENDECTOMY, LAPAROSCOPIC DRAINAGE OF ABSCESS 4/11 Dr. Michaell Cowing - Ileus resolved. Possibly home later today - mobilize -cont scheduled IV tylenol and IV robaxin for improved pain control, IV dilaudid PRN - continue JP and monitor - currently serosanguinous - continue IV antibiotics - plan for 5d postop   ID - zosyn  FEN - IVF, clamp NG, allow soft VTE - SCDs, will discuss plan with hem/onc as below Foley - out and voiding   Hx DVT/PE on  coumadin - reversed preop. INR 1.5 today. Ok to cont lovenox today from surgical standpoint appreciate Dr. Myna Hidalgo recommendations . Continue coumadin ABL anemia - Hgb 10 x3  from 12.6, VSS, repeat CBC in AM HLD HTN CAD  LOS: 6 days    Chevis Pretty III 05/16/2022

## 2022-05-19 ENCOUNTER — Inpatient Hospital Stay (HOSPITAL_BASED_OUTPATIENT_CLINIC_OR_DEPARTMENT_OTHER): Payer: Medicare Other | Admitting: Family

## 2022-05-19 ENCOUNTER — Encounter: Payer: Self-pay | Admitting: Family

## 2022-05-19 ENCOUNTER — Inpatient Hospital Stay: Payer: Medicare Other

## 2022-05-19 ENCOUNTER — Other Ambulatory Visit: Payer: Self-pay

## 2022-05-19 ENCOUNTER — Telehealth: Payer: Self-pay

## 2022-05-19 VITALS — BP 124/63 | HR 77 | Temp 98.1°F | Resp 17 | Ht 72.0 in | Wt 224.0 lb

## 2022-05-19 DIAGNOSIS — I82409 Acute embolism and thrombosis of unspecified deep veins of unspecified lower extremity: Secondary | ICD-10-CM

## 2022-05-19 DIAGNOSIS — I82451 Acute embolism and thrombosis of right peroneal vein: Secondary | ICD-10-CM

## 2022-05-19 DIAGNOSIS — I824Z1 Acute embolism and thrombosis of unspecified deep veins of right distal lower extremity: Secondary | ICD-10-CM

## 2022-05-19 DIAGNOSIS — I2609 Other pulmonary embolism with acute cor pulmonale: Secondary | ICD-10-CM

## 2022-05-19 DIAGNOSIS — Z7901 Long term (current) use of anticoagulants: Secondary | ICD-10-CM | POA: Diagnosis not present

## 2022-05-19 DIAGNOSIS — Z86718 Personal history of other venous thrombosis and embolism: Secondary | ICD-10-CM | POA: Diagnosis present

## 2022-05-19 LAB — CBC WITH DIFFERENTIAL (CANCER CENTER ONLY)
Abs Immature Granulocytes: 0.02 10*3/uL (ref 0.00–0.07)
Basophils Absolute: 0 10*3/uL (ref 0.0–0.1)
Basophils Relative: 1 %
Eosinophils Absolute: 0.1 10*3/uL (ref 0.0–0.5)
Eosinophils Relative: 2 %
HCT: 36.7 % — ABNORMAL LOW (ref 39.0–52.0)
Hemoglobin: 12.3 g/dL — ABNORMAL LOW (ref 13.0–17.0)
Immature Granulocytes: 1 %
Lymphocytes Relative: 30 %
Lymphs Abs: 1.3 10*3/uL (ref 0.7–4.0)
MCH: 30.1 pg (ref 26.0–34.0)
MCHC: 33.5 g/dL (ref 30.0–36.0)
MCV: 90 fL (ref 80.0–100.0)
Monocytes Absolute: 0.4 10*3/uL (ref 0.1–1.0)
Monocytes Relative: 9 %
Neutro Abs: 2.4 10*3/uL (ref 1.7–7.7)
Neutrophils Relative %: 57 %
Platelet Count: 210 10*3/uL (ref 150–400)
RBC: 4.08 MIL/uL — ABNORMAL LOW (ref 4.22–5.81)
RDW: 14.4 % (ref 11.5–15.5)
WBC Count: 4.3 10*3/uL (ref 4.0–10.5)
nRBC: 0 % (ref 0.0–0.2)

## 2022-05-19 LAB — CMP (CANCER CENTER ONLY)
ALT: 71 U/L — ABNORMAL HIGH (ref 0–44)
AST: 34 U/L (ref 15–41)
Albumin: 3.7 g/dL (ref 3.5–5.0)
Alkaline Phosphatase: 90 U/L (ref 38–126)
Anion gap: 8 (ref 5–15)
BUN: 23 mg/dL (ref 8–23)
CO2: 28 mmol/L (ref 22–32)
Calcium: 8.9 mg/dL (ref 8.9–10.3)
Chloride: 106 mmol/L (ref 98–111)
Creatinine: 1.22 mg/dL (ref 0.61–1.24)
GFR, Estimated: >60 mL/min (ref >60)
Glucose, Bld: 99 mg/dL (ref 70–99)
Potassium: 3.9 mmol/L (ref 3.5–5.1)
Sodium: 142 mmol/L (ref 135–145)
Total Bilirubin: 0.5 mg/dL (ref 0.3–1.2)
Total Protein: 6.6 g/dL (ref 6.5–8.1)

## 2022-05-19 LAB — PROTIME-INR
INR: 2.3 — ABNORMAL HIGH (ref 0.8–1.2)
Prothrombin Time: 24.8 seconds — ABNORMAL HIGH (ref 11.4–15.2)

## 2022-05-19 NOTE — Progress Notes (Signed)
Hematology and Oncology Follow Up Visit  Glenn Wheeler [id number] [birth year] 67 y.o. 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 twice daily -- start on 01/20/2022 -- d/c on 03/28/2022 Coumadin 10 mg po q day -- start on 04/07/2022 -- INR 2.5-3.5   Interim History:  Glenn Wheeler is here today for follow-up. He is recuperating nicely from his hospitalization last week for acute appendicitis with perforation and rupture. He had a laparoscopic appendectomy and is still a little sore.  BM's are regular with metamucil. He did have a couple episodes of hemorrhoidal bleeding. None noted today.  No other blood loss noted. No abnormal bruising, no petechiae.  He has still bridging with Lovenox and coumadin. He is currently on 10 mg [place] PO daily.   No fever, chills, n/v, cough, rash, dizziness, SOB, chest pain, palpitations or changes in bladder habits.  Swelling in the left lower extremity is stable at this time. Pedal pulses are 1+. No redness.  Positional tingling in the right leg if he sits a certain way. This resolves with moving.  No falls or syncope reported.  Appetite and hydration are good. Weight is stable at 224 lbs.   ECOG Performance Status: 1 - Symptomatic but completely ambulatory  Medications:  Allergies as of 05/19/2022   No Known Allergies      Medication List        Accurate as of May 19, 2022 10:40 AM. If you have any questions, ask your nurse or doctor.          atorvastatin 40 MG tablet Commonly known as: LIPITOR Take 1 tablet (40 mg total) by mouth at bedtime.   enoxaparin 100 MG/ML injection Commonly known as: Lovenox Inject 1 mL (100 mg total) into the skin every 12 (twelve) hours for 5 days.   methocarbamol 500 MG tablet Commonly known as: ROBAXIN Take 1 tablet (500 mg total) by mouth every 6 (six) hours as needed for muscle spasms.   metoprolol  succinate 25 MG 24 hr tablet Commonly known as: TOPROL-XL TAKE 1 TABLET DAILY What changed: when to take this   multivitamin with minerals tablet Take 1 tablet by mouth at bedtime.   nitroGLYCERIN 0.4 MG SL tablet Commonly known as: NITROSTAT Place 1 tablet (0.4 mg total) under the tongue every 5 (five) minutes as needed for chest pain.   polyethylene glycol 17 g packet Commonly known as: MiraLax Take 17 g by mouth daily as needed for mild constipation.   sildenafil 20 MG tablet Commonly known as: REVATIO Take 20 mg by mouth as needed (erectile disfunction).   TYLENOL PO Take 1 tablet by mouth as needed (pain).   warfarin 10 MG tablet Commonly known as: COUMADIN Take as directed by the anticoagulation clinic. If you are unsure how to take this medication, talk to your nurse or doctor. Original instructions: Take 1 tablet (10 mg total) by mouth daily for 5 days.        Allergies: No Known Allergies  Past Medical History, Surgical history, Social history, and Family History were reviewed and updated.  Review of Systems: All other 10 point review of systems is negative.   Physical Exam:  height is 6' (1.829 m).   Wt Readings from Last 3 Encounters:  05/11/22 233 lb 0.4 oz (105.7 kg)  05/05/22 235 lb 0.6 oz (106.6 kg)  04/07/22 237 lb (  107.5 kg)    Ocular: Sclerae unicteric, pupils equal, round and reactive to light Ear-nose-throat: Oropharynx clear, dentition fair Lymphatic: No cervical or supraclavicular adenopathy Lungs no rales or rhonchi, good excursion bilaterally Heart regular rate and rhythm, no murmur appreciated Abd soft, nontender, positive bowel sounds MSK no focal spinal tenderness, no joint edema Neuro: non-focal, well-oriented, appropriate affect Breasts: Deferred   Lab Results  Component Value Date   WBC 5.3 05/16/2022   HGB 10.8 (L) 05/16/2022   HCT 32.6 (L) 05/16/2022   MCV 90.6 05/16/2022   PLT 164 05/16/2022   No results found for:  "FERRITIN", "IRON", "TIBC", "UIBC", "IRONPCTSAT" Lab Results  Component Value Date   RBC 3.60 (L) 05/16/2022   No results found for: "KPAFRELGTCHN", "LAMBDASER", "KAPLAMBRATIO" No results found for: "IGGSERUM", "IGA", "IGMSERUM" No results found for: "TOTALPROTELP", "ALBUMINELP", "A1GS", "A2GS", "BETS", "BETA2SER", "GAMS", "MSPIKE", "SPEI"   Chemistry      Component Value Date/Time   NA 137 05/15/2022 0457   NA 143 06/05/2013 0919   K 3.4 (L) 05/15/2022 0457   CL 105 05/15/2022 0457   CO2 24 05/15/2022 0457   BUN 10 05/15/2022 0457   BUN 29 (H) 06/05/2013 0919   CREATININE 1.05 05/15/2022 0457   CREATININE 1.29 (H) 05/05/2022 0931   CREATININE 1.92 (H) 04/28/2013 1027      Component Value Date/Time   CALCIUM 8.0 (L) 05/15/2022 0457   ALKPHOS 81 05/09/2022 1818   AST 32 05/09/2022 1818   AST 33 05/05/2022 0931   ALT 29 05/09/2022 1818   ALT 35 05/05/2022 0931   BILITOT 1.2 05/09/2022 1818   BILITOT 0.5 05/05/2022 0931       Impression and Plan: Glenn Wheeler is a pleasant 67 yo caucasian gentleman with history of recurrent thromboembolism. He had been on Xarelto for a long while and then transitioned to aspirin.  He then recurred and was placed on Pradaxa. He also recurred on Pradaxa and is now on Coumadin.  INR today is 2.3. Ok to stop Lovenox today.  Continue Coumadin 10 mg PO daily.  INR check next week on Wednesday.  Repeat US of the right leg is scheduled for 06/22/2022.  Follow-up in 5 weeks.   Eileen Stanford, NP 4/19/202410:40 AM

## 2022-05-19 NOTE — Telephone Encounter (Signed)
Patient informed, declines any questions at this time. Scheduling will call to set up appt

## 2022-05-19 NOTE — Telephone Encounter (Signed)
-----   Message from Erenest Blank, NP sent at 05/19/2022 12:00 PM EDT ----- Please let him know his INR is stable so Dr. Myna Hidalgo said he can stop the Lovenox injections. He will continue his Coumadin regimen. Lab only next week to check INR and follow-up in 5 weeks! Thank you!

## 2022-05-22 ENCOUNTER — Telehealth: Payer: Self-pay | Admitting: *Deleted

## 2022-05-22 NOTE — Telephone Encounter (Signed)
Per scheduling message- Tiffany - Called patient and lvm for a callback to schedule a lab only appointment.

## 2022-05-25 ENCOUNTER — Inpatient Hospital Stay: Payer: Medicare Other

## 2022-05-25 DIAGNOSIS — I2609 Other pulmonary embolism with acute cor pulmonale: Secondary | ICD-10-CM

## 2022-05-25 DIAGNOSIS — Z86718 Personal history of other venous thrombosis and embolism: Secondary | ICD-10-CM | POA: Diagnosis not present

## 2022-05-25 DIAGNOSIS — I824Z1 Acute embolism and thrombosis of unspecified deep veins of right distal lower extremity: Secondary | ICD-10-CM

## 2022-05-25 DIAGNOSIS — I82409 Acute embolism and thrombosis of unspecified deep veins of unspecified lower extremity: Secondary | ICD-10-CM

## 2022-05-25 LAB — PROTIME-INR
INR: 2.9 — ABNORMAL HIGH (ref 0.8–1.2)
Prothrombin Time: 29.8 seconds — ABNORMAL HIGH (ref 11.4–15.2)

## 2022-05-26 ENCOUNTER — Telehealth: Payer: Self-pay | Admitting: *Deleted

## 2022-05-26 NOTE — Telephone Encounter (Signed)
-----   Message from Erenest Blank, NP sent at 05/26/2022 10:45 AM EDT ----- INR is therapeutic! Well done! Continue same dose of Coumadin. INR lab check again in 2 weeks please! Thank you :)  Sarah  ----- Message ----- From: Leory Plowman, Lab In [place] Sent: 05/25/2022   2:51 PM EDT To: Erenest Blank, NP

## 2022-05-26 NOTE — Telephone Encounter (Signed)
Notified pt, msg to scheduling for pt to return in 2 weeks for INR lab check. Pt verbalized understanding. No concerns at this time. Pt states he will wait for a call from scheduling regarding lab appt.

## 2022-06-09 ENCOUNTER — Telehealth: Payer: Self-pay | Admitting: *Deleted

## 2022-06-09 ENCOUNTER — Inpatient Hospital Stay: Payer: Medicare Other | Attending: Hematology & Oncology

## 2022-06-09 DIAGNOSIS — I2609 Other pulmonary embolism with acute cor pulmonale: Secondary | ICD-10-CM

## 2022-06-09 DIAGNOSIS — I82409 Acute embolism and thrombosis of unspecified deep veins of unspecified lower extremity: Secondary | ICD-10-CM

## 2022-06-09 DIAGNOSIS — I824Z1 Acute embolism and thrombosis of unspecified deep veins of right distal lower extremity: Secondary | ICD-10-CM

## 2022-06-09 DIAGNOSIS — I82451 Acute embolism and thrombosis of right peroneal vein: Secondary | ICD-10-CM | POA: Diagnosis present

## 2022-06-09 DIAGNOSIS — I82441 Acute embolism and thrombosis of right tibial vein: Secondary | ICD-10-CM | POA: Insufficient documentation

## 2022-06-09 DIAGNOSIS — Z7901 Long term (current) use of anticoagulants: Secondary | ICD-10-CM | POA: Diagnosis not present

## 2022-06-09 LAB — PROTIME-INR
INR: 5 (ref 0.8–1.2)
Prothrombin Time: 46.7 seconds — ABNORMAL HIGH (ref 11.4–15.2)

## 2022-06-09 NOTE — Telephone Encounter (Signed)
Call placed to patient to notify him per order of Dr. Myna Hidalgo to hold Coumadin today and tomorrow and then to restart Coumadin alternating 10 mg one day and Coumadin 5 mg the next day and to recheck PT/INR in one week. Teach back done.  Message sent to scheduling.  Pt is appreciative of call and has no questions or concerns at this time.

## 2022-06-09 NOTE — Telephone Encounter (Signed)
Dr. Myna Hidalgo notified of PT-46.7 and INR-5.0.  Orders received for pt to hold Coumadin today and tomorrow and then to restart Coumadin alternating 10 mg one day and Coumadin 5 mg the next day and to recheck PT/INR in seven days.

## 2022-06-16 ENCOUNTER — Inpatient Hospital Stay: Payer: Medicare Other

## 2022-06-16 DIAGNOSIS — I82451 Acute embolism and thrombosis of right peroneal vein: Secondary | ICD-10-CM | POA: Diagnosis not present

## 2022-06-16 DIAGNOSIS — I2609 Other pulmonary embolism with acute cor pulmonale: Secondary | ICD-10-CM

## 2022-06-16 DIAGNOSIS — I824Z1 Acute embolism and thrombosis of unspecified deep veins of right distal lower extremity: Secondary | ICD-10-CM

## 2022-06-16 DIAGNOSIS — I82409 Acute embolism and thrombosis of unspecified deep veins of unspecified lower extremity: Secondary | ICD-10-CM

## 2022-06-16 LAB — PROTIME-INR
INR: 2.3 — ABNORMAL HIGH (ref 0.8–1.2)
Prothrombin Time: 25.8 seconds — ABNORMAL HIGH (ref 11.4–15.2)

## 2022-06-22 ENCOUNTER — Inpatient Hospital Stay: Payer: Medicare Other

## 2022-06-22 ENCOUNTER — Ambulatory Visit (HOSPITAL_BASED_OUTPATIENT_CLINIC_OR_DEPARTMENT_OTHER)
Admission: RE | Admit: 2022-06-22 | Discharge: 2022-06-22 | Disposition: A | Discharge status: Home / Self Care | Payer: Medicare Other | Source: Ambulatory Visit | Attending: Hematology & Oncology | Admitting: Hematology & Oncology

## 2022-06-22 ENCOUNTER — Other Ambulatory Visit: Payer: Self-pay

## 2022-06-22 ENCOUNTER — Inpatient Hospital Stay (HOSPITAL_BASED_OUTPATIENT_CLINIC_OR_DEPARTMENT_OTHER): Payer: Medicare Other | Admitting: Hematology & Oncology

## 2022-06-22 ENCOUNTER — Encounter: Payer: Self-pay | Admitting: Hematology & Oncology

## 2022-06-22 VITALS — BP 120/62 | HR 68 | Temp 98.0°F | Resp 18 | Ht 72.0 in | Wt 234.4 lb

## 2022-06-22 DIAGNOSIS — I82451 Acute embolism and thrombosis of right peroneal vein: Secondary | ICD-10-CM | POA: Diagnosis not present

## 2022-06-22 DIAGNOSIS — I2602 Saddle embolus of pulmonary artery with acute cor pulmonale: Secondary | ICD-10-CM | POA: Diagnosis not present

## 2022-06-22 DIAGNOSIS — I2609 Other pulmonary embolism with acute cor pulmonale: Secondary | ICD-10-CM

## 2022-06-22 DIAGNOSIS — I82409 Acute embolism and thrombosis of unspecified deep veins of unspecified lower extremity: Secondary | ICD-10-CM

## 2022-06-22 DIAGNOSIS — I824Z1 Acute embolism and thrombosis of unspecified deep veins of right distal lower extremity: Secondary | ICD-10-CM

## 2022-06-22 LAB — CMP (CANCER CENTER ONLY)
ALT: 28 U/L (ref 0–44)
AST: 24 U/L (ref 15–41)
Albumin: 4.2 g/dL (ref 3.5–5.0)
Alkaline Phosphatase: 80 U/L (ref 38–126)
Anion gap: 10 (ref 5–15)
BUN: 25 mg/dL — ABNORMAL HIGH (ref 8–23)
CO2: 26 mmol/L (ref 22–32)
Calcium: 9.3 mg/dL (ref 8.9–10.3)
Chloride: 105 mmol/L (ref 98–111)
Creatinine: 1.13 mg/dL (ref 0.61–1.24)
GFR, Estimated: >60 mL/min (ref >60)
Glucose, Bld: 142 mg/dL — ABNORMAL HIGH (ref 70–99)
Potassium: 4.1 mmol/L (ref 3.5–5.1)
Sodium: 141 mmol/L (ref 135–145)
Total Bilirubin: 0.6 mg/dL (ref 0.3–1.2)
Total Protein: 6.6 g/dL (ref 6.5–8.1)

## 2022-06-22 LAB — PROTIME-INR
INR: 2.5 — ABNORMAL HIGH (ref 0.8–1.2)
Prothrombin Time: 27.3 seconds — ABNORMAL HIGH (ref 11.4–15.2)

## 2022-06-22 LAB — CBC WITH DIFFERENTIAL (CANCER CENTER ONLY)
Abs Immature Granulocytes: 0.01 10*3/uL (ref 0.00–0.07)
Basophils Absolute: 0.1 10*3/uL (ref 0.0–0.1)
Basophils Relative: 1 %
Eosinophils Absolute: 0.1 10*3/uL (ref 0.0–0.5)
Eosinophils Relative: 1 %
HCT: 40.6 % (ref 39.0–52.0)
Hemoglobin: 13.5 g/dL (ref 13.0–17.0)
Immature Granulocytes: 0 %
Lymphocytes Relative: 29 %
Lymphs Abs: 1.7 10*3/uL (ref 0.7–4.0)
MCH: 29.1 pg (ref 26.0–34.0)
MCHC: 33.3 g/dL (ref 30.0–36.0)
MCV: 87.5 fL (ref 80.0–100.0)
Monocytes Absolute: 0.4 10*3/uL (ref 0.1–1.0)
Monocytes Relative: 7 %
Neutro Abs: 3.5 10*3/uL (ref 1.7–7.7)
Neutrophils Relative %: 62 %
Platelet Count: 148 10*3/uL — ABNORMAL LOW (ref 150–400)
RBC: 4.64 MIL/uL (ref 4.22–5.81)
RDW: 14 % (ref 11.5–15.5)
WBC Count: 5.7 10*3/uL (ref 4.0–10.5)
nRBC: 0 % (ref 0.0–0.2)

## 2022-06-22 NOTE — Progress Notes (Signed)
Hematology and Oncology Follow Up Visit  EAGAN BAERGA [id number] [birth year] 67 y.o. 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 twice daily -- start on 01/20/2022 -- d/c on 03/28/2022 Coumadin 10 mg po q day -- start on 04/07/2022 -- INR 2.5-3.5     Interim History:  Mr. Ketcherside is back for follow-up.  He actually had a acute appendicitis in early April.  He had appendiceal surgery on 05/11/2022.  He got through this without any difficulty.  We had a long heparin during the perioperative period.  He then was back on his Coumadin.  He says little bit of swelling in the left leg.  We did do a Doppler of the leg.  This does show a partially occlusive chronic thrombus in the distal femoral and popliteal vein.  He also had a Baker's cyst.  He is worried about the Baker's cyst.  We will have to see if Orthopedic Surgery needs to drain this.  Otherwise, he is doing okay.  He has had no problems with nausea or vomiting.  He has had no bleeding.  There is no change in bowel or bladder habits.  Currently, I was his performance status is probably ECOG 1.    Medications:  Current Outpatient Medications:    Acetaminophen (TYLENOL PO), Take 1 tablet by mouth as needed (pain)., Disp: , Rfl:    atorvastatin (LIPITOR) 40 MG tablet, Take 1 tablet (40 mg total) by mouth at bedtime., Disp: 90 tablet, Rfl: 3   metoprolol succinate (TOPROL-XL) 25 MG 24 hr tablet, TAKE 1 TABLET DAILY (Patient taking differently: Take 25 mg by mouth at bedtime.), Disp: 90 tablet, Rfl: 3   Multiple Vitamins-Minerals (MULTIVITAMIN WITH MINERALS) tablet, Take 1 tablet by mouth at bedtime., Disp: , Rfl:    sildenafil (REVATIO) 20 MG tablet, Take 20 mg by mouth as needed (erectile disfunction)., Disp: , Rfl:    warfarin (COUMADIN) 10 MG tablet, Take 1 tablet (10 mg total) by mouth daily for 5 days., Disp: 5 tablet, Rfl: 0   nitroGLYCERIN  (NITROSTAT) 0.4 MG SL tablet, Place 1 tablet (0.4 mg total) under the tongue every 5 (five) minutes as needed for chest pain., Disp: 30 tablet, Rfl: 2  Allergies: No Known Allergies  Past Medical History, Surgical history, Social history, and Family History were reviewed and updated.  Review of Systems: Review of Systems  Constitutional: Negative.   HENT:  Negative.    Eyes: Negative.   Respiratory: Negative.    Cardiovascular:  Positive for leg swelling.  Gastrointestinal: Negative.   Endocrine: Negative.   Genitourinary: Negative.    Musculoskeletal:  Positive for arthralgias and myalgias.  Skin: Negative.   Neurological: Negative.   Hematological: Negative.   Psychiatric/Behavioral: Negative.      Physical Exam:  height is 6' (1.829 m) and weight is 234 lb 6.4 oz (106.3 kg). His oral temperature is 98 F (36.7 C). His blood pressure is 120/62 and his pulse is 68. His respiration is 18 and oxygen saturation is 99%.   Wt Readings from Last 3 Encounters:  06/22/22 234 lb 6.4 oz (106.3 kg)  05/19/22 224 lb (101.6 kg)  05/11/22 233 lb 0.4 oz (105.7 kg)    Physical Exam Vitals reviewed.  HENT:     Head: Normocephalic and atraumatic.  Eyes:     Pupils: Pupils are equal, round, and reactive to light.  Cardiovascular:  Rate and Rhythm: Normal rate and regular rhythm.     Heart sounds: Normal heart sounds.  Pulmonary:     Effort: Pulmonary effort is normal.     Breath sounds: Normal breath sounds.  Abdominal:     General: Bowel sounds are normal.     Palpations: Abdomen is soft.  Musculoskeletal:        General: No tenderness or deformity. Normal range of motion.     Cervical back: Normal range of motion.     Comments: With his extremities, he does have some swelling in the right lower extremity.  He has a negative Homans sign.  I cannot palpate any obvious venous cord.  I cannot palpate any obvious Baker's cyst.  Maybe a little bit of swelling in the right thigh.  His  left leg is unremarkable.  He has good pulses in his distal extremities bilaterally.  Lymphadenopathy:     Cervical: No cervical adenopathy.  Skin:    General: Skin is warm and dry.     Findings: No erythema or rash.  Neurological:     Mental Status: He is alert and oriented to person, place, and time.  Psychiatric:        Behavior: Behavior normal.        Thought Content: Thought content normal.        Judgment: Judgment normal.      Lab Results  Component Value Date   WBC 5.7 06/22/2022   HGB 13.5 06/22/2022   HCT 40.6 06/22/2022   MCV 87.5 06/22/2022   PLT 148 (L) 06/22/2022     Chemistry      Component Value Date/Time   NA 141 06/22/2022 1208   NA 143 06/05/2013 0919   K 4.1 06/22/2022 1208   CL 105 06/22/2022 1208   CO2 26 06/22/2022 1208   BUN 25 (H) 06/22/2022 1208   BUN 29 (H) 06/05/2013 0919   CREATININE 1.13 06/22/2022 1208   CREATININE 1.92 (H) 04/28/2013 1027      Component Value Date/Time   CALCIUM 9.3 06/22/2022 1208   ALKPHOS 80 06/22/2022 1208   AST 24 06/22/2022 1208   ALT 28 06/22/2022 1208   BILITOT 0.6 06/22/2022 1208       Impression and Plan: Mr. Nazzal is a very nice 67 year old white male.  He has a history of recurrent thromboembolism.  He had been on Xarelto for quite a while.  We then had him on aspirin.  He had not seen [place] for several years.  He then presented with another thrombus in the right posterior tibial vein and popliteal vein.  He was placed on Pradaxa.  Unfortunately, he developed  another thrombus.  He now is on Coumadin.  His INR today is 2.5.  I think this is perfect.  I will have to see if Orthopedic surgery can see him for the Baker's cyst.  We will see if this needs to be drained.  This may help the swelling.  I think we will probably check his INR in about 2 or 3 weeks.  Hopefully, we can move his appointments out a bit longer.  I do not think we need to do another Doppler probably until September.  I will plan to  see him back probably in about 6 weeks.   [name], MD 5/23/202412:58 PM

## 2022-06-23 ENCOUNTER — Telehealth: Payer: Self-pay

## 2022-06-23 NOTE — Telephone Encounter (Signed)
Advised via MyChart.

## 2022-06-23 NOTE — Telephone Encounter (Signed)
-----   Message from Erenest Blank, NP sent at 06/23/2022  1:59 PM EDT ----- INR therapeutic. Continue current regimen with Coumadin.    ----- Message ----- From: Interface, [facility] [place] Sent: 06/22/2022  12:15 PM EDT To: Erenest Blank, NP

## 2022-07-05 ENCOUNTER — Other Ambulatory Visit: Payer: Self-pay | Admitting: Hematology & Oncology

## 2022-07-13 ENCOUNTER — Encounter: Payer: Self-pay | Admitting: *Deleted

## 2022-07-13 ENCOUNTER — Inpatient Hospital Stay: Payer: Medicare Other | Attending: Hematology & Oncology

## 2022-07-13 ENCOUNTER — Telehealth: Payer: Self-pay | Admitting: *Deleted

## 2022-07-13 ENCOUNTER — Other Ambulatory Visit: Payer: Self-pay | Admitting: *Deleted

## 2022-07-13 DIAGNOSIS — I2602 Saddle embolus of pulmonary artery with acute cor pulmonale: Secondary | ICD-10-CM

## 2022-07-13 DIAGNOSIS — Z7901 Long term (current) use of anticoagulants: Secondary | ICD-10-CM | POA: Diagnosis not present

## 2022-07-13 DIAGNOSIS — I82401 Acute embolism and thrombosis of unspecified deep veins of right lower extremity: Secondary | ICD-10-CM

## 2022-07-13 DIAGNOSIS — Z86711 Personal history of pulmonary embolism: Secondary | ICD-10-CM

## 2022-07-13 DIAGNOSIS — I82551 Chronic embolism and thrombosis of right peroneal vein: Secondary | ICD-10-CM | POA: Insufficient documentation

## 2022-07-13 LAB — PROTIME-INR
INR: 1.9 — ABNORMAL HIGH (ref 0.8–1.2)
Prothrombin Time: 21.9 seconds — ABNORMAL HIGH (ref 11.4–15.2)

## 2022-07-13 NOTE — Telephone Encounter (Signed)
Per scheduling message Misty Stanley - called patient and lvm of upcoming appointments - requested callback to confirm.

## 2022-07-17 ENCOUNTER — Inpatient Hospital Stay: Payer: Medicare Other

## 2022-07-17 DIAGNOSIS — I82401 Acute embolism and thrombosis of unspecified deep veins of right lower extremity: Secondary | ICD-10-CM

## 2022-07-17 DIAGNOSIS — Z86711 Personal history of pulmonary embolism: Secondary | ICD-10-CM

## 2022-07-17 DIAGNOSIS — I82551 Chronic embolism and thrombosis of right peroneal vein: Secondary | ICD-10-CM | POA: Diagnosis not present

## 2022-07-17 LAB — PROTIME-INR
INR: 2.8 — ABNORMAL HIGH (ref 0.8–1.2)
Prothrombin Time: 29.8 seconds — ABNORMAL HIGH (ref 11.4–15.2)

## 2022-07-18 ENCOUNTER — Other Ambulatory Visit: Payer: Self-pay

## 2022-07-18 DIAGNOSIS — I872 Venous insufficiency (chronic) (peripheral): Secondary | ICD-10-CM

## 2022-07-18 DIAGNOSIS — I2510 Atherosclerotic heart disease of native coronary artery without angina pectoris: Secondary | ICD-10-CM

## 2022-07-18 MED ORDER — METOPROLOL SUCCINATE ER 25 MG PO TB24
25.0000 mg | ORAL_TABLET | Freq: Every day | ORAL | 0 refills | Status: DC
Start: 2022-07-18 — End: 2022-08-10

## 2022-07-24 ENCOUNTER — Telehealth: Payer: Self-pay

## 2022-07-24 ENCOUNTER — Inpatient Hospital Stay: Payer: Medicare Other

## 2022-07-24 ENCOUNTER — Other Ambulatory Visit: Payer: Self-pay

## 2022-07-24 DIAGNOSIS — I872 Venous insufficiency (chronic) (peripheral): Secondary | ICD-10-CM

## 2022-07-24 DIAGNOSIS — I82401 Acute embolism and thrombosis of unspecified deep veins of right lower extremity: Secondary | ICD-10-CM

## 2022-07-24 DIAGNOSIS — I82551 Chronic embolism and thrombosis of right peroneal vein: Secondary | ICD-10-CM | POA: Diagnosis not present

## 2022-07-24 LAB — PROTIME-INR
INR: 3.5 — ABNORMAL HIGH (ref 0.8–1.2)
Prothrombin Time: 35 seconds — ABNORMAL HIGH (ref 11.4–15.2)

## 2022-07-24 NOTE — Telephone Encounter (Signed)
-----   Message from [name], MD sent at 07/24/2022  2:40 PM EDT ----- Please call let him know that the INR is on the higher side.  We really need to watch this closely.  He needs to come back in 2 days for a follow-up PT/INR check.  Please set this up.  Cindee Lame

## 2022-07-24 NOTE — Telephone Encounter (Signed)
Advised via MyChart.

## 2022-07-25 ENCOUNTER — Other Ambulatory Visit: Payer: PRIVATE HEALTH INSURANCE

## 2022-07-31 ENCOUNTER — Inpatient Hospital Stay: Payer: Medicare Other | Attending: Hematology & Oncology

## 2022-07-31 ENCOUNTER — Encounter: Payer: Self-pay | Admitting: Hematology & Oncology

## 2022-07-31 ENCOUNTER — Other Ambulatory Visit: Payer: Self-pay

## 2022-07-31 DIAGNOSIS — I82401 Acute embolism and thrombosis of unspecified deep veins of right lower extremity: Secondary | ICD-10-CM

## 2022-07-31 DIAGNOSIS — Z7901 Long term (current) use of anticoagulants: Secondary | ICD-10-CM | POA: Insufficient documentation

## 2022-07-31 DIAGNOSIS — I82451 Acute embolism and thrombosis of right peroneal vein: Secondary | ICD-10-CM | POA: Diagnosis not present

## 2022-07-31 DIAGNOSIS — I82551 Chronic embolism and thrombosis of right peroneal vein: Secondary | ICD-10-CM | POA: Diagnosis present

## 2022-07-31 LAB — PROTIME-INR
INR: 3.7 — ABNORMAL HIGH (ref 0.8–1.2)
Prothrombin Time: 36.8 seconds — ABNORMAL HIGH (ref 11.4–15.2)

## 2022-08-02 ENCOUNTER — Inpatient Hospital Stay: Payer: Medicare Other

## 2022-08-02 ENCOUNTER — Other Ambulatory Visit: Payer: Self-pay

## 2022-08-02 ENCOUNTER — Inpatient Hospital Stay (HOSPITAL_BASED_OUTPATIENT_CLINIC_OR_DEPARTMENT_OTHER): Payer: Medicare Other | Admitting: Hematology & Oncology

## 2022-08-02 ENCOUNTER — Encounter: Payer: Self-pay | Admitting: Hematology & Oncology

## 2022-08-02 VITALS — BP 133/70 | HR 69 | Temp 98.0°F | Resp 16 | Ht 72.0 in | Wt 234.0 lb

## 2022-08-02 DIAGNOSIS — I2602 Saddle embolus of pulmonary artery with acute cor pulmonale: Secondary | ICD-10-CM

## 2022-08-02 DIAGNOSIS — I2609 Other pulmonary embolism with acute cor pulmonale: Secondary | ICD-10-CM

## 2022-08-02 DIAGNOSIS — I872 Venous insufficiency (chronic) (peripheral): Secondary | ICD-10-CM

## 2022-08-02 DIAGNOSIS — I82451 Acute embolism and thrombosis of right peroneal vein: Secondary | ICD-10-CM | POA: Diagnosis not present

## 2022-08-02 LAB — CBC WITH DIFFERENTIAL (CANCER CENTER ONLY)
Abs Immature Granulocytes: 0.01 10*3/uL (ref 0.00–0.07)
Basophils Absolute: 0 10*3/uL (ref 0.0–0.1)
Basophils Relative: 1 %
Eosinophils Absolute: 0.1 10*3/uL (ref 0.0–0.5)
Eosinophils Relative: 3 %
HCT: 43 % (ref 39.0–52.0)
Hemoglobin: 14.2 g/dL (ref 13.0–17.0)
Immature Granulocytes: 0 %
Lymphocytes Relative: 33 %
Lymphs Abs: 1.3 10*3/uL (ref 0.7–4.0)
MCH: 28.7 pg (ref 26.0–34.0)
MCHC: 33 g/dL (ref 30.0–36.0)
MCV: 86.9 fL (ref 80.0–100.0)
Monocytes Absolute: 0.3 10*3/uL (ref 0.1–1.0)
Monocytes Relative: 8 %
Neutro Abs: 2.2 10*3/uL (ref 1.7–7.7)
Neutrophils Relative %: 55 %
Platelet Count: 117 10*3/uL — ABNORMAL LOW (ref 150–400)
RBC: 4.95 MIL/uL (ref 4.22–5.81)
RDW: 14 % (ref 11.5–15.5)
WBC Count: 3.9 10*3/uL — ABNORMAL LOW (ref 4.0–10.5)
nRBC: 0 % (ref 0.0–0.2)

## 2022-08-02 LAB — PROTIME-INR
INR: 3.1 — ABNORMAL HIGH (ref 0.8–1.2)
Prothrombin Time: 32.1 seconds — ABNORMAL HIGH (ref 11.4–15.2)

## 2022-08-02 LAB — CMP (CANCER CENTER ONLY)
ALT: 27 U/L (ref 0–44)
AST: 29 U/L (ref 15–41)
Albumin: 4.2 g/dL (ref 3.5–5.0)
Alkaline Phosphatase: 76 U/L (ref 38–126)
Anion gap: 8 (ref 5–15)
BUN: 24 mg/dL — ABNORMAL HIGH (ref 8–23)
CO2: 27 mmol/L (ref 22–32)
Calcium: 9.2 mg/dL (ref 8.9–10.3)
Chloride: 107 mmol/L (ref 98–111)
Creatinine: 1.22 mg/dL (ref 0.61–1.24)
GFR, Estimated: >60 mL/min (ref >60)
Glucose, Bld: 162 mg/dL — ABNORMAL HIGH (ref 70–99)
Potassium: 4 mmol/L (ref 3.5–5.1)
Sodium: 142 mmol/L (ref 135–145)
Total Bilirubin: 0.7 mg/dL (ref 0.3–1.2)
Total Protein: 6.9 g/dL (ref 6.5–8.1)

## 2022-08-02 NOTE — Progress Notes (Signed)
Hematology and Oncology Follow Up Visit  Glenn Wheeler [id number] 1956/01/22 67 y.o. 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 twice daily -- start on 01/20/2022 -- d/c on 03/28/2022 Coumadin 10 mg po q day -- start on 04/07/2022 -- INR 2.5-3.5     Interim History:  Glenn Wheeler is back for follow-up.  He has totally recovered from the appendicitis that he had back in April.  He underwent surgery about 3 months ago already.  It is hard to believe that has been 3 months.  He is on Coumadin.  His INR today was 3.1.  Apply going to have to do another Doppler of his leg when we see him back to see how the thrombus has resolved or improved.  He has had no bleeding.  He said no change in bowel or bladder habits.  He has had no melena or bright red blood per rectum.  He has had no issues with cough.  He has had no headache.  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  I know this will be a big event for him.  Overall, I would say that his performance status is probably ECOG 1.   Medications:  Current Outpatient Medications:    tamsulosin (FLOMAX) 0.4 MG CAPS capsule, Take 0.4 mg by mouth daily., Disp: , Rfl:    Acetaminophen (TYLENOL PO), Take 1 tablet by mouth as needed (pain)., Disp: , Rfl:    atorvastatin (LIPITOR) 40 MG tablet, Take 1 tablet (40 mg total) by mouth at bedtime., Disp: 90 tablet, Rfl: 3   metoprolol succinate (TOPROL-XL) 25 MG 24 hr tablet, Take 1 tablet (25 mg total) by mouth daily., Disp: 30 tablet, Rfl: 0   Multiple Vitamins-Minerals (MULTIVITAMIN WITH MINERALS) tablet, Take 1 tablet by mouth at bedtime., Disp: , Rfl:    nitroGLYCERIN (NITROSTAT) 0.4 MG SL tablet, Place 1 tablet (0.4 mg total) under the tongue every 5 (five) minutes as needed for chest pain., Disp: 30 tablet, Rfl: 2   sildenafil (REVATIO) 20 MG tablet, Take 20 mg by mouth as needed  (erectile disfunction)., Disp: , Rfl:    warfarin (COUMADIN) 10 MG tablet, TAKE 1 TABLET BY MOUTH EVERY DAY, Disp: 30 tablet, Rfl: 1  Allergies: No Known Allergies  Past Medical History, Surgical history, Social history, and Family History were reviewed and updated.  Review of Systems: Review of Systems  Constitutional: Negative.   HENT:  Negative.    Eyes: Negative.   Respiratory: Negative.    Cardiovascular:  Positive for leg swelling.  Gastrointestinal: Negative.   Endocrine: Negative.   Genitourinary: Negative.    Musculoskeletal:  Positive for arthralgias and myalgias.  Skin: Negative.   Neurological: Negative.   Hematological: Negative.   Psychiatric/Behavioral: Negative.      Physical Exam:  height is 6' (1.829 m) and weight is 234 lb (106.1 kg). His oral temperature is 98 F (36.7 C). His blood pressure is 133/70 and his pulse is 69. His respiration is 16 and oxygen saturation is 97%.   Wt Readings from Last 3 Encounters:  08/02/22 234 lb (106.1 kg)  06/22/22 234 lb 6.4 oz (106.3 kg)  05/19/22 224 lb (101.6 kg)    Physical Exam Vitals reviewed.  HENT:     Head: Normocephalic and atraumatic.  Eyes:     Pupils: Pupils are equal, round, and reactive to light.  Cardiovascular:  Rate and Rhythm: Normal rate and regular rhythm.     Heart sounds: Normal heart sounds.  Pulmonary:     Effort: Pulmonary effort is normal.     Breath sounds: Normal breath sounds.  Abdominal:     General: Bowel sounds are normal.     Palpations: Abdomen is soft.  Musculoskeletal:        General: No tenderness or deformity. Normal range of motion.     Cervical back: Normal range of motion.     Comments: With his extremities, he does have some swelling in the right lower extremity.  He has a negative Homans sign.  I cannot palpate any obvious venous cord.  I cannot palpate any obvious Baker's cyst.  Maybe a little bit of swelling in the right thigh.  His left leg is unremarkable.  He  has good pulses in his distal extremities bilaterally.  Lymphadenopathy:     Cervical: No cervical adenopathy.  Skin:    General: Skin is warm and dry.     Findings: No erythema or rash.  Neurological:     Mental Status: He is alert and oriented to person, place, and time.  Psychiatric:        Behavior: Behavior normal.        Thought Content: Thought content normal.        Judgment: Judgment normal.      Lab Results  Component Value Date   WBC 3.9 (L) 08/02/2022   HGB 14.2 08/02/2022   HCT 43.0 08/02/2022   MCV 86.9 08/02/2022   PLT 117 (L) 08/02/2022     Chemistry      Component Value Date/Time   NA 142 08/02/2022 1013   NA 143 06/05/2013 0919   K 4.0 08/02/2022 1013   CL 107 08/02/2022 1013   CO2 27 08/02/2022 1013   BUN 24 (H) 08/02/2022 1013   BUN 29 (H) 06/05/2013 0919   CREATININE 1.22 08/02/2022 1013   CREATININE 1.92 (H) 04/28/2013 1027      Component Value Date/Time   CALCIUM 9.2 08/02/2022 1013   ALKPHOS 76 08/02/2022 1013   AST 29 08/02/2022 1013   ALT 27 08/02/2022 1013   BILITOT 0.7 08/02/2022 1013       Impression and Plan: Glenn Wheeler is a very nice 67 year old white male.  He has a history of recurrent thromboembolism.  He had been on Xarelto for quite a while.  We then had him on aspirin.  He had not seen [place] for several years.  He then presented with another thrombus in the right posterior tibial vein and popliteal vein.  He was placed on Pradaxa.  Unfortunately, he developed  another thrombus.  He now is on Coumadin.  His INR today is 3.1.  I think this is perfect.  We will continue him on 10 mg today.  Will check his INR in 3 weeks.  When I see him back in 6 weeks, we will do a Doppler the same day.   [name], MD 7/3/202411:41 AM

## 2022-08-09 ENCOUNTER — Ambulatory Visit: Payer: Medicare Other | Admitting: Cardiology

## 2022-08-09 ENCOUNTER — Ambulatory Visit: Payer: Medicare Other | Admitting: Student

## 2022-08-09 ENCOUNTER — Other Ambulatory Visit: Payer: PRIVATE HEALTH INSURANCE

## 2022-08-09 ENCOUNTER — Encounter: Payer: Self-pay | Admitting: Cardiology

## 2022-08-09 VITALS — BP 132/87 | HR 77 | Ht 72.0 in | Wt 233.0 lb

## 2022-08-09 DIAGNOSIS — I25118 Atherosclerotic heart disease of native coronary artery with other forms of angina pectoris: Secondary | ICD-10-CM

## 2022-08-09 NOTE — Progress Notes (Signed)
Follow up visit  Subjective:   Glenn Wheeler, male    DOB: 04-Apr-1955, 67 y.o.   MRN: [id number]     HPI  Chief Complaint  Patient presents with   Coronary Artery Disease   Follow-up     67 y.o. Caucasian male with hypertension, hyperlipidemia, CAD, h/o Leishmaniasis in 2013, h/o recurrent thromboembolism, former smoker  Patient regularly sees Dr. Myna Hidalgo for his recurrent DVT.  After trying couple different agents, he is settled on warfarin.  Recently, he did notice occasional episodes of chest tightness only lasting for few seconds, but occurring during exertion such as walking uphill.  He denies any exertional dyspnea, palpitations.     Current Outpatient Medications:    Acetaminophen (TYLENOL PO), Take 1 tablet by mouth as needed (pain)., Disp: , Rfl:    atorvastatin (LIPITOR) 40 MG tablet, Take 1 tablet (40 mg total) by mouth at bedtime., Disp: 90 tablet, Rfl: 3   metoprolol succinate (TOPROL-XL) 25 MG 24 hr tablet, Take 1 tablet (25 mg total) by mouth daily., Disp: 30 tablet, Rfl: 0   Multiple Vitamins-Minerals (MULTIVITAMIN WITH MINERALS) tablet, Take 1 tablet by mouth at bedtime., Disp: , Rfl:    nitroGLYCERIN (NITROSTAT) 0.4 MG SL tablet, Place 1 tablet (0.4 mg total) under the tongue every 5 (five) minutes as needed for chest pain., Disp: 30 tablet, Rfl: 2   sildenafil (REVATIO) 20 MG tablet, Take 20 mg by mouth as needed (erectile disfunction)., Disp: , Rfl:    tamsulosin (FLOMAX) 0.4 MG CAPS capsule, Take 0.4 mg by mouth daily., Disp: , Rfl:    warfarin (COUMADIN) 10 MG tablet, TAKE 1 TABLET BY MOUTH EVERY DAY, Disp: 30 tablet, Rfl: 1   Cardiovascular & other pertient studies:  Reviewed external labs and tests, independently interpreted  EKG 08/09/2022: Sinus rhythm 75 bpm  Possible old anteroseptal infarct  Coronary angiography 07/2018: LM: Calcified 40% mid to distal left main stenosis. IVUS MLA 8.2 mm2 (>6 mm2 has favorable prognosis with medical therapy  alone) LAD: Normal LCx: Normal Ramus: Normal RCA: Large vessel. Normal.    Recommendation: Aggressive medical therapy with Aspirin, high intensity statin. Continue metoprolol succinate 25 mg daily. NSVT likely of nonischemic origin.   Recent labs: 08/02/2022: Glucose 162, BUN/Cr 24/1.22. EGFR >60. Na/K 142/4. Rest of the CMP normal H/H 14/43. MCV 86. Platelets 117  04/12/2021: Glucose 112, BUN/Cr 27/1.14. EGFR 70. Na/K 140/4.1. Rest of the CMP normal H/H 15/45. MCV 89. Platelets 127 HbA1C NA Chol 152, TG 167, HDL 43, LDL 76 TSH 0.7 normal    Review of Systems  Cardiovascular:  Negative for chest pain, dyspnea on exertion, leg swelling, palpitations and syncope.         Vitals:   08/09/22 1015  BP: 132/87  Pulse: 77  SpO2: 95%    Body mass index is 31.6 kg/m. Filed Weights   08/09/22 1015  Weight: 233 lb (105.7 kg)     Objective:   Physical Exam Vitals and nursing note reviewed.  Constitutional:      General: He is not in acute distress. Neck:     Vascular: No JVD.  Cardiovascular:     Rate and Rhythm: Normal rate and regular rhythm.     Heart sounds: Normal heart sounds. No murmur heard. Pulmonary:     Effort: Pulmonary effort is normal.     Breath sounds: Normal breath sounds. No wheezing or rales.  Musculoskeletal:     Right lower leg: No edema.  Left lower leg: No edema.             Visit diagnoses:   ICD-10-CM   1. Coronary artery disease involving native coronary artery of native heart with other form of angina pectoris (HCC)  I25.118 EKG 12-Lead        Assessment & Recommendations:    67 y.o. Caucasian male with hypertension, hyperlipidemia, CAD, h/o Leishmaniasis in 2013, h/o recurrent thromboembolism, former smoker  CAD: Mild disease noted on cath in 2020.  Recent exertional chest pain is very brief to be true angina, However, given his CAD history, will check exercise nuclear stress test.  Get lipid panel results from  PCP.  Recurrent thromboembolism, thrombocytopenia: Continue warfarin. Continue f/u w/Dr. Myna Hidalgo.  F/u in 4 weeks    Elder Negus, MD Pager: [phone] Office: [phone]

## 2022-08-10 ENCOUNTER — Other Ambulatory Visit: Payer: Self-pay | Admitting: Cardiology

## 2022-08-10 DIAGNOSIS — I2510 Atherosclerotic heart disease of native coronary artery without angina pectoris: Secondary | ICD-10-CM

## 2022-08-15 ENCOUNTER — Inpatient Hospital Stay: Payer: Medicare Other

## 2022-08-15 DIAGNOSIS — I2609 Other pulmonary embolism with acute cor pulmonale: Secondary | ICD-10-CM

## 2022-08-15 DIAGNOSIS — I82451 Acute embolism and thrombosis of right peroneal vein: Secondary | ICD-10-CM | POA: Diagnosis not present

## 2022-08-15 LAB — PROTIME-INR
INR: 3.6 — ABNORMAL HIGH (ref 0.8–1.2)
Prothrombin Time: 35.8 seconds — ABNORMAL HIGH (ref 11.4–15.2)

## 2022-08-16 ENCOUNTER — Inpatient Hospital Stay: Payer: Medicare Other

## 2022-08-16 ENCOUNTER — Other Ambulatory Visit: Payer: Self-pay

## 2022-08-16 DIAGNOSIS — D691 Qualitative platelet defects: Secondary | ICD-10-CM

## 2022-08-22 ENCOUNTER — Telehealth (HOSPITAL_BASED_OUTPATIENT_CLINIC_OR_DEPARTMENT_OTHER): Payer: Self-pay

## 2022-08-23 ENCOUNTER — Inpatient Hospital Stay: Payer: Medicare Other

## 2022-08-23 ENCOUNTER — Other Ambulatory Visit: Payer: Self-pay

## 2022-08-23 DIAGNOSIS — I82451 Acute embolism and thrombosis of right peroneal vein: Secondary | ICD-10-CM | POA: Diagnosis not present

## 2022-08-23 DIAGNOSIS — I2609 Other pulmonary embolism with acute cor pulmonale: Secondary | ICD-10-CM

## 2022-08-23 DIAGNOSIS — D691 Qualitative platelet defects: Secondary | ICD-10-CM

## 2022-08-23 LAB — PROTIME-INR
INR: 2.9 — ABNORMAL HIGH (ref 0.8–1.2)
Prothrombin Time: 30.8 seconds — ABNORMAL HIGH (ref 11.4–15.2)

## 2022-08-24 ENCOUNTER — Encounter: Payer: Self-pay | Admitting: Hematology & Oncology

## 2022-08-31 ENCOUNTER — Ambulatory Visit: Payer: Medicare Other

## 2022-08-31 DIAGNOSIS — I25118 Atherosclerotic heart disease of native coronary artery with other forms of angina pectoris: Secondary | ICD-10-CM

## 2022-09-13 ENCOUNTER — Telehealth: Payer: Self-pay | Admitting: *Deleted

## 2022-09-13 ENCOUNTER — Inpatient Hospital Stay: Payer: Medicare Other | Attending: Hematology & Oncology

## 2022-09-13 DIAGNOSIS — I82551 Chronic embolism and thrombosis of right peroneal vein: Secondary | ICD-10-CM | POA: Insufficient documentation

## 2022-09-13 DIAGNOSIS — Z7901 Long term (current) use of anticoagulants: Secondary | ICD-10-CM | POA: Insufficient documentation

## 2022-09-13 DIAGNOSIS — I2609 Other pulmonary embolism with acute cor pulmonale: Secondary | ICD-10-CM

## 2022-09-13 LAB — PROTIME-INR
INR: 3.6 — ABNORMAL HIGH (ref 0.8–1.2)
Prothrombin Time: 36.3 seconds — ABNORMAL HIGH (ref 11.4–15.2)

## 2022-09-13 NOTE — Telephone Encounter (Signed)
Per scheduling message Tiffany, called patient and gave upcoming appointment, requested call back to confirm.

## 2022-09-14 ENCOUNTER — Encounter: Payer: Self-pay | Admitting: Cardiology

## 2022-09-14 ENCOUNTER — Ambulatory Visit: Payer: Medicare Other | Admitting: Cardiology

## 2022-09-14 ENCOUNTER — Ambulatory Visit (HOSPITAL_BASED_OUTPATIENT_CLINIC_OR_DEPARTMENT_OTHER)
Admission: RE | Admit: 2022-09-14 | Discharge: 2022-09-14 | Disposition: A | Discharge status: Home / Self Care | Payer: Medicare Other | Source: Ambulatory Visit | Attending: Hematology & Oncology | Admitting: Hematology & Oncology

## 2022-09-14 VITALS — BP 124/62 | HR 76 | Resp 16 | Ht 72.0 in | Wt 233.0 lb

## 2022-09-14 DIAGNOSIS — I2609 Other pulmonary embolism with acute cor pulmonale: Secondary | ICD-10-CM | POA: Diagnosis not present

## 2022-09-14 DIAGNOSIS — I25118 Atherosclerotic heart disease of native coronary artery with other forms of angina pectoris: Secondary | ICD-10-CM

## 2022-09-14 DIAGNOSIS — E782 Mixed hyperlipidemia: Secondary | ICD-10-CM

## 2022-09-14 DIAGNOSIS — I872 Venous insufficiency (chronic) (peripheral): Secondary | ICD-10-CM | POA: Insufficient documentation

## 2022-09-14 NOTE — Addendum Note (Signed)
Addended by: Elder Negus on: 09/14/2022 03:11 PM   Modules accepted: Level of Service

## 2022-09-14 NOTE — Progress Notes (Signed)
Hello,  Follow up visit  Subjective:   Glenn Wheeler, male    DOB: April 04, 1955, 67 y.o.   MRN: [id number]     HPI  Chief Complaint  Patient presents with   Coronary artery disease of native artery of native heart wi   NSVT (nonsustained ventricular tachycardia)    Follow-up     67 y.o. Caucasian male with hypertension, hyperlipidemia, CAD, h/o Leishmaniasis in 2013, h/o recurrent thromboembolism, former smoker  No recent chest pain. Reviewed recent test results with the patient, details below.      Current Outpatient Medications:    Acetaminophen (TYLENOL PO), Take 1 tablet by mouth as needed (pain)., Disp: , Rfl:    atorvastatin (LIPITOR) 40 MG tablet, Take 1 tablet (40 mg total) by mouth at bedtime., Disp: 90 tablet, Rfl: 3   metoprolol succinate (TOPROL-XL) 25 MG 24 hr tablet, TAKE 1 TABLET (25 MG TOTAL) BY MOUTH DAILY., Disp: 90 tablet, Rfl: 1   Multiple Vitamins-Minerals (MULTIVITAMIN WITH MINERALS) tablet, Take 1 tablet by mouth at bedtime., Disp: , Rfl:    nitroGLYCERIN (NITROSTAT) 0.4 MG SL tablet, Place 1 tablet (0.4 mg total) under the tongue every 5 (five) minutes as needed for chest pain., Disp: 30 tablet, Rfl: 2   sildenafil (REVATIO) 20 MG tablet, Take 20 mg by mouth as needed (erectile disfunction)., Disp: , Rfl:    tamsulosin (FLOMAX) 0.4 MG CAPS capsule, Take 0.4 mg by mouth daily., Disp: , Rfl:    warfarin (COUMADIN) 10 MG tablet, TAKE 1 TABLET BY MOUTH EVERY DAY, Disp: 30 tablet, Rfl: 1   Cardiovascular & other pertient studies:  Reviewed external labs and tests, independently interpreted  Exercise nuclear stress test 08/31/2022: There is a fixed mild defect in the inferior region consistent with diaphragmatic attenuation. No ischemia or scar.   Overall LV systolic function is normal without regional wall motion abnormalities. Calculated Stress LV EF: 48%, visually normal.  Normal ECG stress. Reduced exercise tolerance, no chest pain. Stress terminated due  to THR a and fatigue. The patient exercised for 5 minutes of a Bruce protocol, achieving approximately 7.03 METs & 97% MPHR. The baseline blood pressure was 150/70 mmHg and increased to 220/74 mmHg, which is a hypertensive response.  Compared to 07/08/2018, previously noted moderate to large sized inferior wall ischemia not present.  No change in the calculated LVEF. Low risk.      EKG 08/09/2022: Sinus rhythm 75 bpm  Possible old anteroseptal infarct  Coronary angiography 07/2018: LM: Calcified 40% mid to distal left main stenosis. IVUS MLA 8.2 mm2 (>6 mm2 has favorable prognosis with medical therapy alone) LAD: Normal LCx: Normal Ramus: Normal RCA: Large vessel. Normal.    Recommendation: Aggressive medical therapy with Aspirin, high intensity statin. Continue metoprolol succinate 25 mg daily. NSVT likely of nonischemic origin.   Recent labs: 08/02/2022: Glucose 162, BUN/Cr 24/1.22. EGFR >60. Na/K 142/4. Rest of the CMP normal H/H 14/43. MCV 86. Platelets 117  04/12/2021: Glucose 112, BUN/Cr 27/1.14. EGFR 70. Na/K 140/4.1. Rest of the CMP normal H/H 15/45. MCV 89. Platelets 127 HbA1C NA Chol 152, TG 167, HDL 43, LDL 76 TSH 0.7 normal    Review of Systems  Cardiovascular:  Negative for chest pain, dyspnea on exertion, leg swelling, palpitations and syncope.         Vitals:   09/14/22 1437  BP: 124/62  Pulse: 76  Resp: 16  SpO2: 98%     Body mass index is 31.6 kg/m. Filed Weights  09/14/22 1437  Weight: 233 lb (105.7 kg)      Objective:   Physical Exam Vitals and nursing note reviewed.  Constitutional:      General: He is not in acute distress. Neck:     Vascular: No JVD.  Cardiovascular:     Rate and Rhythm: Normal rate and regular rhythm.     Heart sounds: Normal heart sounds. No murmur heard. Pulmonary:     Effort: Pulmonary effort is normal.     Breath sounds: Normal breath sounds. No wheezing or rales.  Musculoskeletal:     Right lower leg: No  edema.     Left lower leg: No edema.             Visit diagnoses:   ICD-10-CM   1. Coronary artery disease of native artery of native heart with stable angina pectoris (HCC)  I25.118     2. Mixed hyperlipidemia  E78.2          Assessment & Recommendations:    67 y.o. Caucasian male with hypertension, hyperlipidemia, CAD, h/o Leishmaniasis in 2013, h/o recurrent thromboembolism, former smoker  CAD: Mild disease noted on cath in 2020.  Recent exertional chest pain is very brief to be true angina,  Exercise nuclear stress test (88416) without significant iscemia. Suspect his chest pain is noncardaic   Recurrent thromboembolism, thrombocytopenia: Continue warfarin. Continue f/u w/Dr. Myna Hidalgo.  F/u in 1 year    Elder Negus, MD Pager: [phone] Office: [phone]

## 2022-09-15 ENCOUNTER — Telehealth: Payer: Self-pay

## 2022-09-15 NOTE — Telephone Encounter (Signed)
-----   Message from [name] sent at 09/15/2022  5:55 AM EDT ----- Please call and let him know that the blood clot in his right leg is chronic.  It is stable.  I suspect it would always be there.  Cindee Lame

## 2022-09-15 NOTE — Telephone Encounter (Signed)
Advised via MyChart.

## 2022-09-18 ENCOUNTER — Other Ambulatory Visit: Payer: Self-pay

## 2022-09-18 DIAGNOSIS — I824Z1 Acute embolism and thrombosis of unspecified deep veins of right distal lower extremity: Secondary | ICD-10-CM

## 2022-09-19 ENCOUNTER — Telehealth: Payer: Self-pay | Admitting: *Deleted

## 2022-09-19 ENCOUNTER — Inpatient Hospital Stay: Payer: Medicare Other

## 2022-09-19 DIAGNOSIS — I82551 Chronic embolism and thrombosis of right peroneal vein: Secondary | ICD-10-CM | POA: Diagnosis not present

## 2022-09-19 DIAGNOSIS — I824Z1 Acute embolism and thrombosis of unspecified deep veins of right distal lower extremity: Secondary | ICD-10-CM

## 2022-09-19 LAB — PROTIME-INR
INR: 4.4 (ref 0.8–1.2)
Prothrombin Time: 42.1 seconds — ABNORMAL HIGH (ref 11.4–15.2)

## 2022-09-19 NOTE — Telephone Encounter (Signed)
Call placed to patient and advised him that the INR was 4.4 and it was repeated to verify critical result. Per Dr. Myna Hidalgo, HOLD Coumadin for two days, (8/20 and 21), restart Coumadin 10 mg on Thursday, 22, and alternate with 5 mg. Recheck PT/INR in a week. Patient has enough Coumadin 5 and 10 mg tablets to get him through the week. Patient verbalized understanding.LOS sent to scheduling for lab appointment to be scheduled.

## 2022-09-20 ENCOUNTER — Inpatient Hospital Stay: Payer: Medicare Other

## 2022-09-26 ENCOUNTER — Inpatient Hospital Stay: Payer: Medicare Other

## 2022-09-26 ENCOUNTER — Other Ambulatory Visit: Payer: Self-pay

## 2022-09-26 DIAGNOSIS — I2601 Septic pulmonary embolism with acute cor pulmonale: Secondary | ICD-10-CM

## 2022-09-26 DIAGNOSIS — I82551 Chronic embolism and thrombosis of right peroneal vein: Secondary | ICD-10-CM | POA: Diagnosis not present

## 2022-09-26 DIAGNOSIS — Z7901 Long term (current) use of anticoagulants: Secondary | ICD-10-CM

## 2022-09-26 LAB — PROTIME-INR
INR: 2.3 — ABNORMAL HIGH (ref 0.8–1.2)
Prothrombin Time: 25.4 seconds — ABNORMAL HIGH (ref 11.4–15.2)

## 2022-09-27 ENCOUNTER — Other Ambulatory Visit: Payer: Self-pay | Admitting: Hematology & Oncology

## 2022-09-29 ENCOUNTER — Inpatient Hospital Stay: Payer: Medicare Other

## 2022-09-29 ENCOUNTER — Telehealth: Payer: Self-pay | Admitting: *Deleted

## 2022-09-29 DIAGNOSIS — I82551 Chronic embolism and thrombosis of right peroneal vein: Secondary | ICD-10-CM | POA: Diagnosis not present

## 2022-09-29 DIAGNOSIS — I2601 Septic pulmonary embolism with acute cor pulmonale: Secondary | ICD-10-CM

## 2022-09-29 LAB — PROTIME-INR
INR: 2.8 — ABNORMAL HIGH (ref 0.8–1.2)
Prothrombin Time: 29.3 seconds — ABNORMAL HIGH (ref 11.4–15.2)

## 2022-09-29 NOTE — Telephone Encounter (Signed)
-----   Message from Glenn Wheeler sent at 09/29/2022 12:01 PM EDT ----- Call let him know that the INR is perfect at 2.8.  No change in his Coumadin dose.  Please have him recheck the INR in 2 weeks.  Thanks.  Cindee Lame

## 2022-10-13 ENCOUNTER — Inpatient Hospital Stay: Payer: Medicare Other | Attending: Hematology & Oncology

## 2022-10-13 DIAGNOSIS — I82551 Chronic embolism and thrombosis of right peroneal vein: Secondary | ICD-10-CM | POA: Diagnosis present

## 2022-10-13 DIAGNOSIS — Z7901 Long term (current) use of anticoagulants: Secondary | ICD-10-CM | POA: Insufficient documentation

## 2022-10-13 DIAGNOSIS — I2609 Other pulmonary embolism with acute cor pulmonale: Secondary | ICD-10-CM

## 2022-10-13 LAB — PROTIME-INR
INR: 3.9 — ABNORMAL HIGH (ref 0.8–1.2)
Prothrombin Time: 38.6 s — ABNORMAL HIGH (ref 11.4–15.2)

## 2022-10-17 ENCOUNTER — Inpatient Hospital Stay: Payer: Medicare Other

## 2022-10-17 ENCOUNTER — Telehealth: Payer: Self-pay

## 2022-10-17 ENCOUNTER — Other Ambulatory Visit: Payer: Self-pay

## 2022-10-17 DIAGNOSIS — I824Z1 Acute embolism and thrombosis of unspecified deep veins of right distal lower extremity: Secondary | ICD-10-CM

## 2022-10-17 DIAGNOSIS — I2609 Other pulmonary embolism with acute cor pulmonale: Secondary | ICD-10-CM

## 2022-10-17 DIAGNOSIS — I82551 Chronic embolism and thrombosis of right peroneal vein: Secondary | ICD-10-CM | POA: Diagnosis not present

## 2022-10-17 LAB — PROTIME-INR
INR: 4 — ABNORMAL HIGH (ref 0.8–1.2)
Prothrombin Time: 39.5 s — ABNORMAL HIGH (ref 11.4–15.2)

## 2022-10-17 NOTE — Telephone Encounter (Signed)
Patient called stating he will be out of town Monday when he is supposed to come for an INR check, requesting to have done in [place] at [facility]. Order to be faxed to [phone], awaiting MD signature and will fax.

## 2022-10-17 NOTE — Telephone Encounter (Signed)
-----   Message from [name] sent at 10/17/2022  1:05 PM EDT ----- Call and let him know that the INR is 4.0.  This is a little on the high side.  How much Coumadin is he taking?  Cindee Lame

## 2022-10-17 NOTE — Telephone Encounter (Signed)
Pt advised and states he is taking 10 mg of coumadin a day.  Per Eileen Stanford, NP: hold tonight and tomorrow and start back alternating between 5 and 10 mg. Recheck on Monday 10/23/22.   Pt advised and states he will be out of town next week until Friday, 10/27/22. Will send a scheduling message to have pt contacted for a lab apt then.

## 2022-10-23 ENCOUNTER — Encounter: Payer: Self-pay | Admitting: Hematology & Oncology

## 2022-10-27 ENCOUNTER — Inpatient Hospital Stay: Payer: Medicare Other

## 2022-11-01 ENCOUNTER — Other Ambulatory Visit: Payer: Self-pay

## 2022-11-01 DIAGNOSIS — I2609 Other pulmonary embolism with acute cor pulmonale: Secondary | ICD-10-CM

## 2022-11-07 ENCOUNTER — Inpatient Hospital Stay: Payer: Medicare Other | Attending: Hematology & Oncology

## 2022-11-07 DIAGNOSIS — I82551 Chronic embolism and thrombosis of right peroneal vein: Secondary | ICD-10-CM | POA: Diagnosis present

## 2022-11-07 DIAGNOSIS — Z7901 Long term (current) use of anticoagulants: Secondary | ICD-10-CM | POA: Diagnosis not present

## 2022-11-07 DIAGNOSIS — I2609 Other pulmonary embolism with acute cor pulmonale: Secondary | ICD-10-CM

## 2022-11-07 LAB — PROTIME-INR
INR: 4.4 (ref 0.8–1.2)
Prothrombin Time: 41.9 s — ABNORMAL HIGH (ref 11.4–15.2)

## 2022-11-08 ENCOUNTER — Encounter: Payer: Self-pay | Admitting: Hematology & Oncology

## 2022-11-14 ENCOUNTER — Inpatient Hospital Stay: Payer: Medicare Other

## 2022-11-14 DIAGNOSIS — I82551 Chronic embolism and thrombosis of right peroneal vein: Secondary | ICD-10-CM | POA: Diagnosis not present

## 2022-11-14 DIAGNOSIS — I2609 Other pulmonary embolism with acute cor pulmonale: Secondary | ICD-10-CM

## 2022-11-14 LAB — CBC WITH DIFFERENTIAL (CANCER CENTER ONLY)
Abs Immature Granulocytes: 0.03 K/uL (ref 0.00–0.07)
Basophils Absolute: 0.1 K/uL (ref 0.0–0.1)
Basophils Relative: 1 %
Eosinophils Absolute: 0.1 K/uL (ref 0.0–0.5)
Eosinophils Relative: 2 %
HCT: 43.1 % (ref 39.0–52.0)
Hemoglobin: 14.4 g/dL (ref 13.0–17.0)
Immature Granulocytes: 0 %
Lymphocytes Relative: 24 %
Lymphs Abs: 1.6 K/uL (ref 0.7–4.0)
MCH: 29.1 pg (ref 26.0–34.0)
MCHC: 33.4 g/dL (ref 30.0–36.0)
MCV: 87.1 fL (ref 80.0–100.0)
Monocytes Absolute: 0.6 K/uL (ref 0.1–1.0)
Monocytes Relative: 8 %
Neutro Abs: 4.3 K/uL (ref 1.7–7.7)
Neutrophils Relative %: 65 %
Platelet Count: 118 K/uL — ABNORMAL LOW (ref 150–400)
RBC: 4.95 MIL/uL (ref 4.22–5.81)
RDW: 14.7 % (ref 11.5–15.5)
WBC Count: 6.7 K/uL (ref 4.0–10.5)
nRBC: 0 % (ref 0.0–0.2)

## 2022-11-14 LAB — CMP (CANCER CENTER ONLY)
ALT: 31 U/L (ref 0–44)
AST: 24 U/L (ref 15–41)
Albumin: 3.8 g/dL (ref 3.5–5.0)
Alkaline Phosphatase: 86 U/L (ref 38–126)
Anion gap: 6 (ref 5–15)
BUN: 26 mg/dL — ABNORMAL HIGH (ref 8–23)
CO2: 29 mmol/L (ref 22–32)
Calcium: 8.9 mg/dL (ref 8.9–10.3)
Chloride: 103 mmol/L (ref 98–111)
Creatinine: 1.22 mg/dL (ref 0.61–1.24)
GFR, Estimated: >60 mL/min (ref >60)
Glucose, Bld: 100 mg/dL — ABNORMAL HIGH (ref 70–99)
Potassium: 4.3 mmol/L (ref 3.5–5.1)
Sodium: 138 mmol/L (ref 135–145)
Total Bilirubin: 0.5 mg/dL (ref 0.3–1.2)
Total Protein: 6.5 g/dL (ref 6.5–8.1)

## 2022-11-14 LAB — PROTIME-INR
INR: 3.4 — ABNORMAL HIGH (ref 0.8–1.2)
Prothrombin Time: 34.3 s — ABNORMAL HIGH (ref 11.4–15.2)

## 2022-11-26 ENCOUNTER — Other Ambulatory Visit: Payer: Self-pay | Admitting: Hematology & Oncology

## 2022-11-28 ENCOUNTER — Inpatient Hospital Stay: Payer: Medicare Other

## 2022-11-28 ENCOUNTER — Other Ambulatory Visit: Payer: Self-pay

## 2022-11-28 DIAGNOSIS — I2609 Other pulmonary embolism with acute cor pulmonale: Secondary | ICD-10-CM

## 2022-11-28 DIAGNOSIS — I82551 Chronic embolism and thrombosis of right peroneal vein: Secondary | ICD-10-CM | POA: Diagnosis not present

## 2022-11-28 LAB — PROTIME-INR
INR: 2.2 — ABNORMAL HIGH (ref 0.8–1.2)
Prothrombin Time: 24.2 s — ABNORMAL HIGH (ref 11.4–15.2)

## 2022-12-05 ENCOUNTER — Other Ambulatory Visit: Payer: Self-pay

## 2022-12-05 DIAGNOSIS — Z7901 Long term (current) use of anticoagulants: Secondary | ICD-10-CM

## 2022-12-06 ENCOUNTER — Encounter: Payer: Self-pay | Admitting: Hematology & Oncology

## 2022-12-06 ENCOUNTER — Other Ambulatory Visit: Payer: Self-pay

## 2022-12-06 ENCOUNTER — Inpatient Hospital Stay: Payer: Medicare Other | Attending: Hematology & Oncology

## 2022-12-06 ENCOUNTER — Telehealth: Payer: Self-pay

## 2022-12-06 DIAGNOSIS — Z7901 Long term (current) use of anticoagulants: Secondary | ICD-10-CM | POA: Diagnosis not present

## 2022-12-06 DIAGNOSIS — I82551 Chronic embolism and thrombosis of right peroneal vein: Secondary | ICD-10-CM | POA: Diagnosis present

## 2022-12-06 LAB — PROTIME-INR
INR: 2.1 — ABNORMAL HIGH (ref 0.8–1.2)
Prothrombin Time: 23.7 s — ABNORMAL HIGH (ref 11.4–15.2)

## 2022-12-06 NOTE — Telephone Encounter (Signed)
-----   Message from [name] sent at 12/06/2022 11:07 AM EST ----- Call and let him know that the INR is only 2.1.  This is probably on the low side.  How much Coumadin is he taking?

## 2022-12-06 NOTE — Telephone Encounter (Signed)
Pt states he is taking 10mg  x 2 days then 5 mg x 1 day, then repeats. Per Dr Myna Hidalgo take 10 mg everyday and repeat lab in 1 week. Pt advised and states understanding. Repeat lab scheduled 12/13/22.

## 2022-12-07 ENCOUNTER — Other Ambulatory Visit: Payer: Self-pay | Admitting: Cardiology

## 2022-12-07 DIAGNOSIS — I2510 Atherosclerotic heart disease of native coronary artery without angina pectoris: Secondary | ICD-10-CM

## 2022-12-13 ENCOUNTER — Inpatient Hospital Stay: Payer: Medicare Other

## 2022-12-13 ENCOUNTER — Other Ambulatory Visit: Payer: Self-pay | Admitting: *Deleted

## 2022-12-13 ENCOUNTER — Telehealth: Payer: Self-pay | Admitting: *Deleted

## 2022-12-13 DIAGNOSIS — Z7901 Long term (current) use of anticoagulants: Secondary | ICD-10-CM

## 2022-12-13 DIAGNOSIS — I82551 Chronic embolism and thrombosis of right peroneal vein: Secondary | ICD-10-CM | POA: Diagnosis not present

## 2022-12-13 LAB — PROTIME-INR
INR: 3.1 — ABNORMAL HIGH (ref 0.8–1.2)
Prothrombin Time: 31.8 s — ABNORMAL HIGH (ref 11.4–15.2)

## 2022-12-13 NOTE — Telephone Encounter (Signed)
Per Dr. Myna Hidalgo, please continue taking Coumadin 10mg  po daily. Repeat PT/INR in two weeks. Next lab check is November 27th. He verbalized understanding.LOS sent to scheduling.

## 2022-12-27 ENCOUNTER — Inpatient Hospital Stay: Payer: Medicare Other

## 2022-12-27 ENCOUNTER — Other Ambulatory Visit: Payer: Self-pay | Admitting: *Deleted

## 2022-12-27 ENCOUNTER — Encounter: Payer: Self-pay | Admitting: *Deleted

## 2022-12-27 DIAGNOSIS — Z7901 Long term (current) use of anticoagulants: Secondary | ICD-10-CM

## 2022-12-27 DIAGNOSIS — I82551 Chronic embolism and thrombosis of right peroneal vein: Secondary | ICD-10-CM | POA: Diagnosis not present

## 2022-12-27 LAB — PROTIME-INR
INR: 3.3 — ABNORMAL HIGH (ref 0.8–1.2)
Prothrombin Time: 33.4 s — ABNORMAL HIGH (ref 11.4–15.2)

## 2023-01-06 ENCOUNTER — Other Ambulatory Visit: Payer: Self-pay | Admitting: Cardiology

## 2023-01-06 DIAGNOSIS — I2510 Atherosclerotic heart disease of native coronary artery without angina pectoris: Secondary | ICD-10-CM

## 2023-01-10 ENCOUNTER — Inpatient Hospital Stay: Payer: Medicare Other

## 2023-01-11 ENCOUNTER — Other Ambulatory Visit: Payer: Self-pay

## 2023-01-11 ENCOUNTER — Telehealth: Payer: Self-pay | Admitting: Cardiology

## 2023-01-11 DIAGNOSIS — I2510 Atherosclerotic heart disease of native coronary artery without angina pectoris: Secondary | ICD-10-CM

## 2023-01-11 MED ORDER — METOPROLOL SUCCINATE ER 25 MG PO TB24: 25.0000 mg | ORAL_TABLET | Freq: Every day | ORAL | 2 refills | Status: AC

## 2023-01-11 NOTE — Telephone Encounter (Signed)
*  STAT* If patient is at the pharmacy, call can be transferred to refill team.   1. Which medications need to be refilled? (please list name of each medication and dose if known)   metoprolol succinate (TOPROL-XL) 25 MG 24 hr tablet     2. Would you like to learn more about the convenience, safety, & potential cost savings by using the [facility]? No   3. Are you open to using the [facility] (Type [facility]. ) No   4. Which pharmacy/location (including street and city if local pharmacy) is medication to be sent to? [id number] - [place], [place] - [address] AT [address]OSS FROM [address]    5. Do they need a 30 day or 90 day supply? 90 day

## 2023-01-11 NOTE — Telephone Encounter (Signed)
Called patient to advise mediation refill sent to pharmacy.

## 2023-01-12 ENCOUNTER — Inpatient Hospital Stay: Payer: Medicare Other | Attending: Hematology & Oncology

## 2023-01-12 ENCOUNTER — Other Ambulatory Visit: Payer: Self-pay

## 2023-01-12 ENCOUNTER — Telehealth: Payer: Self-pay

## 2023-01-12 DIAGNOSIS — Z7901 Long term (current) use of anticoagulants: Secondary | ICD-10-CM | POA: Insufficient documentation

## 2023-01-12 DIAGNOSIS — I2699 Other pulmonary embolism without acute cor pulmonale: Secondary | ICD-10-CM | POA: Diagnosis not present

## 2023-01-12 DIAGNOSIS — I82551 Chronic embolism and thrombosis of right peroneal vein: Secondary | ICD-10-CM | POA: Insufficient documentation

## 2023-01-12 DIAGNOSIS — I824Z1 Acute embolism and thrombosis of unspecified deep veins of right distal lower extremity: Secondary | ICD-10-CM

## 2023-01-12 LAB — PROTIME-INR
INR: 3.1 — ABNORMAL HIGH (ref 0.8–1.2)
Prothrombin Time: 32 s — ABNORMAL HIGH (ref 11.4–15.2)

## 2023-01-12 NOTE — Telephone Encounter (Signed)
Advised via MyChart.

## 2023-01-12 NOTE — Telephone Encounter (Signed)
-----   Message from [name] sent at 01/12/2023 12:14 PM EST ----- Please call and let him know that the INR is perfect at 3.1.  Please repeat the INR in 3 weeks.  Thanks.  Cindee Lame

## 2023-01-15 ENCOUNTER — Other Ambulatory Visit: Payer: Self-pay

## 2023-01-15 ENCOUNTER — Inpatient Hospital Stay: Payer: Medicare Other

## 2023-01-15 ENCOUNTER — Encounter: Payer: Self-pay | Admitting: Cardiology

## 2023-01-15 ENCOUNTER — Telehealth: Payer: Self-pay

## 2023-01-15 DIAGNOSIS — I82551 Chronic embolism and thrombosis of right peroneal vein: Secondary | ICD-10-CM | POA: Diagnosis not present

## 2023-01-15 DIAGNOSIS — Z7901 Long term (current) use of anticoagulants: Secondary | ICD-10-CM

## 2023-01-15 DIAGNOSIS — I824Z1 Acute embolism and thrombosis of unspecified deep veins of right distal lower extremity: Secondary | ICD-10-CM

## 2023-01-15 LAB — PROTIME-INR
INR: 4.2 (ref 0.8–1.2)
Prothrombin Time: 40.7 s — ABNORMAL HIGH (ref 11.4–15.2)

## 2023-01-15 NOTE — Telephone Encounter (Signed)
Critical result received from lab of INR 4.2  Dr. Myna Hidalgo aware and no new orders received. Pt to continue to current regimen of coumadin and have labs rechecked in one week. Pt notified via MyChart.  Scheduling message sent to lab.

## 2023-01-22 ENCOUNTER — Telehealth: Payer: Self-pay

## 2023-01-22 ENCOUNTER — Inpatient Hospital Stay: Payer: Medicare Other

## 2023-01-22 DIAGNOSIS — I82551 Chronic embolism and thrombosis of right peroneal vein: Secondary | ICD-10-CM | POA: Diagnosis not present

## 2023-01-22 DIAGNOSIS — I824Z1 Acute embolism and thrombosis of unspecified deep veins of right distal lower extremity: Secondary | ICD-10-CM

## 2023-01-22 LAB — PROTIME-INR
INR: 3.8 — ABNORMAL HIGH (ref 0.8–1.2)
Prothrombin Time: 37.8 s — ABNORMAL HIGH (ref 11.4–15.2)

## 2023-01-22 NOTE — Telephone Encounter (Signed)
-----   Message from [name] sent at 01/22/2023  3:28 PM EST ----- Please call and let him know that the INR is okay at 3.8.  No change in the Coumadin.  Lets have him check his INR in 2 weeks.  Thanks.  Cindee Lame

## 2023-01-22 NOTE — Telephone Encounter (Signed)
Advised via MyChart.

## 2023-01-29 ENCOUNTER — Inpatient Hospital Stay: Payer: Medicare Other

## 2023-02-03 ENCOUNTER — Other Ambulatory Visit: Payer: Self-pay | Admitting: Hematology & Oncology

## 2023-02-05 ENCOUNTER — Inpatient Hospital Stay: Payer: Medicare Other | Attending: Hematology & Oncology

## 2023-02-05 DIAGNOSIS — Z7901 Long term (current) use of anticoagulants: Secondary | ICD-10-CM | POA: Diagnosis not present

## 2023-02-05 DIAGNOSIS — I824Z1 Acute embolism and thrombosis of unspecified deep veins of right distal lower extremity: Secondary | ICD-10-CM

## 2023-02-05 DIAGNOSIS — I82551 Chronic embolism and thrombosis of right peroneal vein: Secondary | ICD-10-CM | POA: Insufficient documentation

## 2023-02-05 LAB — PROTIME-INR
INR: 3.2 — ABNORMAL HIGH (ref 0.8–1.2)
Prothrombin Time: 33.3 s — ABNORMAL HIGH (ref 11.4–15.2)

## 2023-02-16 ENCOUNTER — Inpatient Hospital Stay: Payer: Medicare Other

## 2023-02-16 DIAGNOSIS — I824Z1 Acute embolism and thrombosis of unspecified deep veins of right distal lower extremity: Secondary | ICD-10-CM

## 2023-02-16 DIAGNOSIS — I82551 Chronic embolism and thrombosis of right peroneal vein: Secondary | ICD-10-CM | POA: Diagnosis not present

## 2023-02-16 LAB — PROTIME-INR
INR: 3.6 — ABNORMAL HIGH (ref 0.8–1.2)
Prothrombin Time: 35.8 s — ABNORMAL HIGH (ref 11.4–15.2)

## 2023-02-19 ENCOUNTER — Inpatient Hospital Stay: Payer: Medicare Other

## 2023-03-02 ENCOUNTER — Inpatient Hospital Stay: Payer: Medicare Other

## 2023-03-02 ENCOUNTER — Other Ambulatory Visit: Payer: Self-pay | Admitting: Hematology & Oncology

## 2023-03-02 ENCOUNTER — Inpatient Hospital Stay: Payer: Medicare Other | Admitting: Family

## 2023-03-05 ENCOUNTER — Telehealth: Payer: Self-pay | Admitting: *Deleted

## 2023-03-05 ENCOUNTER — Other Ambulatory Visit: Payer: Self-pay | Admitting: Family

## 2023-03-05 ENCOUNTER — Encounter: Payer: Self-pay | Admitting: Family

## 2023-03-05 ENCOUNTER — Inpatient Hospital Stay (HOSPITAL_BASED_OUTPATIENT_CLINIC_OR_DEPARTMENT_OTHER): Payer: Medicare Other | Admitting: Family

## 2023-03-05 ENCOUNTER — Inpatient Hospital Stay: Payer: Medicare Other | Attending: Hematology & Oncology

## 2023-03-05 VITALS — BP 126/75 | HR 77 | Temp 98.2°F | Resp 19 | Ht 72.0 in | Wt 235.4 lb

## 2023-03-05 DIAGNOSIS — I2609 Other pulmonary embolism with acute cor pulmonale: Secondary | ICD-10-CM

## 2023-03-05 DIAGNOSIS — I82511 Chronic embolism and thrombosis of right femoral vein: Secondary | ICD-10-CM | POA: Diagnosis not present

## 2023-03-05 DIAGNOSIS — I82531 Chronic embolism and thrombosis of right popliteal vein: Secondary | ICD-10-CM | POA: Insufficient documentation

## 2023-03-05 DIAGNOSIS — I824Z1 Acute embolism and thrombosis of unspecified deep veins of right distal lower extremity: Secondary | ICD-10-CM

## 2023-03-05 DIAGNOSIS — I2601 Septic pulmonary embolism with acute cor pulmonale: Secondary | ICD-10-CM

## 2023-03-05 DIAGNOSIS — I82409 Acute embolism and thrombosis of unspecified deep veins of unspecified lower extremity: Secondary | ICD-10-CM

## 2023-03-05 DIAGNOSIS — I82401 Acute embolism and thrombosis of unspecified deep veins of right lower extremity: Secondary | ICD-10-CM

## 2023-03-05 DIAGNOSIS — Z7901 Long term (current) use of anticoagulants: Secondary | ICD-10-CM | POA: Diagnosis not present

## 2023-03-05 DIAGNOSIS — I82501 Chronic embolism and thrombosis of unspecified deep veins of right lower extremity: Secondary | ICD-10-CM | POA: Diagnosis not present

## 2023-03-05 LAB — CBC WITH DIFFERENTIAL (CANCER CENTER ONLY)
Abs Immature Granulocytes: 0.01 10*3/uL (ref 0.00–0.07)
Basophils Absolute: 0 10*3/uL (ref 0.0–0.1)
Basophils Relative: 0 %
Eosinophils Absolute: 0.1 10*3/uL (ref 0.0–0.5)
Eosinophils Relative: 2 %
HCT: 42.5 % (ref 39.0–52.0)
Hemoglobin: 14.2 g/dL (ref 13.0–17.0)
Immature Granulocytes: 0 %
Lymphocytes Relative: 34 %
Lymphs Abs: 1.5 10*3/uL (ref 0.7–4.0)
MCH: 29.4 pg (ref 26.0–34.0)
MCHC: 33.4 g/dL (ref 30.0–36.0)
MCV: 88 fL (ref 80.0–100.0)
Monocytes Absolute: 0.4 10*3/uL (ref 0.1–1.0)
Monocytes Relative: 10 %
Neutro Abs: 2.3 10*3/uL (ref 1.7–7.7)
Neutrophils Relative %: 54 %
Platelet Count: 90 10*3/uL — ABNORMAL LOW (ref 150–400)
RBC: 4.83 MIL/uL (ref 4.22–5.81)
RDW: 14.3 % (ref 11.5–15.5)
WBC Count: 4.3 10*3/uL (ref 4.0–10.5)
nRBC: 0 % (ref 0.0–0.2)

## 2023-03-05 LAB — CMP (CANCER CENTER ONLY)
ALT: 40 U/L (ref 0–44)
AST: 41 U/L (ref 15–41)
Albumin: 4 g/dL (ref 3.5–5.0)
Alkaline Phosphatase: 81 U/L (ref 38–126)
Anion gap: 8 (ref 5–15)
BUN: 28 mg/dL — ABNORMAL HIGH (ref 8–23)
CO2: 28 mmol/L (ref 22–32)
Calcium: 8.5 mg/dL — ABNORMAL LOW (ref 8.9–10.3)
Chloride: 106 mmol/L (ref 98–111)
Creatinine: 1.17 mg/dL (ref 0.61–1.24)
GFR, Estimated: >60 mL/min (ref >60)
Glucose, Bld: 103 mg/dL — ABNORMAL HIGH (ref 70–99)
Potassium: 4 mmol/L (ref 3.5–5.1)
Sodium: 142 mmol/L (ref 135–145)
Total Bilirubin: 0.7 mg/dL (ref 0.0–1.2)
Total Protein: 6.7 g/dL (ref 6.5–8.1)

## 2023-03-05 LAB — PROTIME-INR
INR: 4.1 (ref 0.8–1.2)
Prothrombin Time: 40 s — ABNORMAL HIGH (ref 11.4–15.2)

## 2023-03-05 NOTE — Progress Notes (Signed)
Hematology and Oncology Follow Up Visit  Glenn Wheeler [id number] 01-21-1956 68 y.o. 03/05/2023   Principle Diagnosis:  Occlusive thromboembolism of right peroneal vein -- recurrent Chronic DVT within the right femoral and popliteal veins   Current Therapy:        Pradaxa 150 mg p.o. twice daily -- start on 01/20/2022 -- d/c on 03/28/2022 Coumadin 10 mg po q day -- start on 04/07/2022 -- INR 2.5-3.5   Interim History:  Glenn Wheeler is here today with his wife for follow-up. He is getting over a bout of what he thinks was the flu. He has not had a fever in 36 hours.  He has a productive cough with tan/yellow phlegm. Lung sounds are clear at this time. No chills, n/v, rash, dizziness, SOB, chest pain, palpitations, abdominal pain or changes in bowel or bladder habits.  He is currently on Coumadin 10 mg PO daily.  No abnormal blood loss noted. No bruising or petechiae.  No swelling, numbness or tingling in the extremities.  No falls or syncope.  Appetite is improving and he is doing his best to stay well hydrated. His weight 235 lbs.   ECOG Performance Status: 1 - Symptomatic but completely ambulatory  Medications:  Allergies as of 03/05/2023   No Known Allergies      Medication List        Accurate as of March 05, 2023  2:53 PM. If you have any questions, ask your nurse or doctor.          atorvastatin 40 MG tablet Commonly known as: LIPITOR Take 1 tablet (40 mg total) by mouth at bedtime.   metoprolol succinate 25 MG 24 hr tablet Commonly known as: TOPROL-XL Take 1 tablet (25 mg total) by mouth daily.   multivitamin with minerals tablet Take 1 tablet by mouth at bedtime.   nitroGLYCERIN 0.4 MG SL tablet Commonly known as: NITROSTAT Place 1 tablet (0.4 mg total) under the tongue every 5 (five) minutes as needed for chest pain.   promethazine-dextromethorphan 6.25-15 MG/5ML syrup Commonly known as: PROMETHAZINE-DM Take 5 mLs by mouth 4 (four) times daily as needed  for cough.   sildenafil 20 MG tablet Commonly known as: REVATIO Take 20 mg by mouth as needed (erectile disfunction).   tamsulosin 0.4 MG Caps capsule Commonly known as: FLOMAX Take 0.4 mg by mouth daily.   TYLENOL PO Take 1 tablet by mouth as needed (pain).   warfarin 10 MG tablet Commonly known as: COUMADIN Take as directed by the anticoagulation clinic. If you are unsure how to take this medication, talk to your nurse or doctor. Original instructions: TAKE 1 TABLET BY MOUTH EVERY DAY        Allergies: No Known Allergies  Past Medical History, Surgical history, Social history, and Family History were reviewed and updated.  Review of Systems: All other 10 point review of systems is negative.   Physical Exam:  vitals were not taken for this visit.   Wt Readings from Last 3 Encounters:  09/14/22 233 lb (105.7 kg)  08/09/22 233 lb (105.7 kg)  08/02/22 234 lb (106.1 kg)    Ocular: Sclerae unicteric, pupils equal, round and reactive to light Ear-nose-throat: Oropharynx clear, dentition fair Lymphatic: No cervical or supraclavicular adenopathy Lungs no rales or rhonchi, good excursion bilaterally Heart regular rate and rhythm, no murmur appreciated Abd soft, nontender, positive bowel sounds MSK no focal spinal tenderness, no joint edema Neuro: non-focal, well-oriented, appropriate affect Breasts: Deferred   Lab Results  Component Value Date   WBC 4.3 03/05/2023   HGB 14.2 03/05/2023   HCT 42.5 03/05/2023   MCV 88.0 03/05/2023   PLT 90 (L) 03/05/2023   No results found for: "FERRITIN", "IRON", "TIBC", "UIBC", "IRONPCTSAT" Lab Results  Component Value Date   RBC 4.83 03/05/2023   No results found for: "KPAFRELGTCHN", "LAMBDASER", "KAPLAMBRATIO" No results found for: "IGGSERUM", "IGA", "IGMSERUM" No results found for: "TOTALPROTELP", "ALBUMINELP", "A1GS", "A2GS", "BETS", "BETA2SER", "GAMS", "MSPIKE", "SPEI"   Chemistry      Component Value Date/Time   NA 138  11/14/2022 0935   NA 143 06/05/2013 0919   K 4.3 11/14/2022 0935   CL 103 11/14/2022 0935   CO2 29 11/14/2022 0935   BUN 26 (H) 11/14/2022 0935   BUN 29 (H) 06/05/2013 0919   CREATININE 1.22 11/14/2022 0935   CREATININE 1.92 (H) 04/28/2013 1027      Component Value Date/Time   CALCIUM 8.9 11/14/2022 0935   ALKPHOS 86 11/14/2022 0935   AST 24 11/14/2022 0935   ALT 31 11/14/2022 0935   BILITOT 0.5 11/14/2022 0935       Impression and Plan: Glenn Wheeler is a pleasant 68 yo gentleman with history of recurrent DVT and chronic DVT in the right lower extremity.  He recurred while on Pradaxa and is now tolerating Coumadin nicely.  So far there has been no new recurrence.  INR is 4.1 today. No change in Coumadin dose at this time.  INR check every 3 weeks, follow-up in 3 months.   Eileen Stanford, NP 2/3/20252:53 PM

## 2023-03-05 NOTE — Telephone Encounter (Signed)
Received a call from lab downstairs that patients INR is 4.1.  Eileen Stanford NP notified.  No orders received for changes to dose.

## 2023-03-12 ENCOUNTER — Telehealth: Payer: Self-pay

## 2023-03-12 ENCOUNTER — Inpatient Hospital Stay: Payer: Medicare Other

## 2023-03-12 DIAGNOSIS — I82409 Acute embolism and thrombosis of unspecified deep veins of unspecified lower extremity: Secondary | ICD-10-CM

## 2023-03-12 DIAGNOSIS — I2609 Other pulmonary embolism with acute cor pulmonale: Secondary | ICD-10-CM

## 2023-03-12 DIAGNOSIS — I82401 Acute embolism and thrombosis of unspecified deep veins of right lower extremity: Secondary | ICD-10-CM

## 2023-03-12 DIAGNOSIS — I824Z1 Acute embolism and thrombosis of unspecified deep veins of right distal lower extremity: Secondary | ICD-10-CM

## 2023-03-12 DIAGNOSIS — I2601 Septic pulmonary embolism with acute cor pulmonale: Secondary | ICD-10-CM

## 2023-03-12 DIAGNOSIS — I82531 Chronic embolism and thrombosis of right popliteal vein: Secondary | ICD-10-CM | POA: Diagnosis not present

## 2023-03-12 DIAGNOSIS — Z7901 Long term (current) use of anticoagulants: Secondary | ICD-10-CM

## 2023-03-12 LAB — PROTIME-INR
INR: 5.2 (ref 0.8–1.2)
Prothrombin Time: 48.3 s — ABNORMAL HIGH (ref 11.4–15.2)

## 2023-03-12 NOTE — Telephone Encounter (Signed)
 Received call from pt reporting that he was started on Doxycyline 100mg  bid x 10 days on Friday. His PCP advised him to contact us  as this may influence his INR.  Pt already scheduled for INR this am.   Dr Maria Shiner aware of critical high INR 5.2. Per Dr Maria Shiner, hold Coumadin  x 3 days then recheck INR on Thursday.   Instructions and patient understanding communicated via mychart. dph

## 2023-03-15 ENCOUNTER — Inpatient Hospital Stay: Payer: Medicare Other

## 2023-03-15 ENCOUNTER — Telehealth: Payer: Self-pay

## 2023-03-15 DIAGNOSIS — I2601 Septic pulmonary embolism with acute cor pulmonale: Secondary | ICD-10-CM

## 2023-03-15 DIAGNOSIS — I2782 Chronic pulmonary embolism: Secondary | ICD-10-CM

## 2023-03-15 DIAGNOSIS — I82409 Acute embolism and thrombosis of unspecified deep veins of unspecified lower extremity: Secondary | ICD-10-CM

## 2023-03-15 DIAGNOSIS — I82531 Chronic embolism and thrombosis of right popliteal vein: Secondary | ICD-10-CM | POA: Diagnosis not present

## 2023-03-15 DIAGNOSIS — I82401 Acute embolism and thrombosis of unspecified deep veins of right lower extremity: Secondary | ICD-10-CM

## 2023-03-15 DIAGNOSIS — I824Z1 Acute embolism and thrombosis of unspecified deep veins of right distal lower extremity: Secondary | ICD-10-CM

## 2023-03-15 DIAGNOSIS — Z7901 Long term (current) use of anticoagulants: Secondary | ICD-10-CM

## 2023-03-15 LAB — PROTIME-INR
INR: 2.1 — ABNORMAL HIGH (ref 0.8–1.2)
Prothrombin Time: 23.6 s — ABNORMAL HIGH (ref 11.4–15.2)

## 2023-03-15 NOTE — Telephone Encounter (Signed)
Received phone call from patient who stated that he had his INR drawn this morning. Pt states he has 4 days left of doxycycline but was told to hold his coumadin for 3 days and have repeat INR.  Pt inquiring what he is to do about his coumadin.  Results reviewed by Dr. Myna Hidalgo. Per Dr. Myna Hidalgo pt is to restart his coumadin and have labs rechecked in one week. Pt verbalized that he is to restart his 10 mg coumadin daily.  Pt had no further questions. Scheduling message sent for lab only appointment in one week.

## 2023-03-19 ENCOUNTER — Telehealth: Payer: Self-pay | Admitting: *Deleted

## 2023-03-19 NOTE — Telephone Encounter (Signed)
 Received a call from patient about his appt this Thursday.  He is having to go out of town to care for a family member.  Requested his PT INR be drawn there in [place] at [facility].  This is ok with Dr Myna Hidalgo.  Faxed order to [phone].  Requested results be faxed to [place].  Patient states he will also follow up with the results.

## 2023-03-30 ENCOUNTER — Encounter: Payer: Self-pay | Admitting: *Deleted

## 2023-04-02 ENCOUNTER — Inpatient Hospital Stay: Payer: Medicare Other

## 2023-04-13 ENCOUNTER — Inpatient Hospital Stay: Payer: Medicare Other | Attending: Hematology & Oncology

## 2023-04-13 ENCOUNTER — Encounter: Payer: Self-pay | Admitting: *Deleted

## 2023-04-13 DIAGNOSIS — Z7901 Long term (current) use of anticoagulants: Secondary | ICD-10-CM | POA: Insufficient documentation

## 2023-04-13 DIAGNOSIS — I2601 Septic pulmonary embolism with acute cor pulmonale: Secondary | ICD-10-CM

## 2023-04-13 DIAGNOSIS — I82401 Acute embolism and thrombosis of unspecified deep veins of right lower extremity: Secondary | ICD-10-CM

## 2023-04-13 DIAGNOSIS — I2609 Other pulmonary embolism with acute cor pulmonale: Secondary | ICD-10-CM

## 2023-04-13 DIAGNOSIS — Z86718 Personal history of other venous thrombosis and embolism: Secondary | ICD-10-CM | POA: Insufficient documentation

## 2023-04-13 DIAGNOSIS — I82409 Acute embolism and thrombosis of unspecified deep veins of unspecified lower extremity: Secondary | ICD-10-CM

## 2023-04-13 DIAGNOSIS — I824Z1 Acute embolism and thrombosis of unspecified deep veins of right distal lower extremity: Secondary | ICD-10-CM

## 2023-04-13 LAB — PROTIME-INR
INR: 3.6 — ABNORMAL HIGH (ref 0.8–1.2)
Prothrombin Time: 36.1 s — ABNORMAL HIGH (ref 11.4–15.2)

## 2023-04-24 ENCOUNTER — Encounter: Payer: Self-pay | Admitting: Gastroenterology

## 2023-04-26 ENCOUNTER — Encounter: Payer: Self-pay | Admitting: Family

## 2023-05-07 ENCOUNTER — Inpatient Hospital Stay: Payer: Medicare Other | Attending: Hematology & Oncology

## 2023-05-07 DIAGNOSIS — Z7901 Long term (current) use of anticoagulants: Secondary | ICD-10-CM | POA: Insufficient documentation

## 2023-05-07 DIAGNOSIS — I82409 Acute embolism and thrombosis of unspecified deep veins of unspecified lower extremity: Secondary | ICD-10-CM

## 2023-05-07 DIAGNOSIS — I82511 Chronic embolism and thrombosis of right femoral vein: Secondary | ICD-10-CM | POA: Diagnosis not present

## 2023-05-07 DIAGNOSIS — I82531 Chronic embolism and thrombosis of right popliteal vein: Secondary | ICD-10-CM | POA: Insufficient documentation

## 2023-05-07 DIAGNOSIS — I82401 Acute embolism and thrombosis of unspecified deep veins of right lower extremity: Secondary | ICD-10-CM

## 2023-05-07 DIAGNOSIS — I2601 Septic pulmonary embolism with acute cor pulmonale: Secondary | ICD-10-CM

## 2023-05-07 DIAGNOSIS — I824Z1 Acute embolism and thrombosis of unspecified deep veins of right distal lower extremity: Secondary | ICD-10-CM

## 2023-05-07 DIAGNOSIS — I82551 Chronic embolism and thrombosis of right peroneal vein: Secondary | ICD-10-CM | POA: Insufficient documentation

## 2023-05-07 DIAGNOSIS — I2609 Other pulmonary embolism with acute cor pulmonale: Secondary | ICD-10-CM

## 2023-05-07 LAB — PROTIME-INR
INR: 3.5 — ABNORMAL HIGH (ref 0.8–1.2)
Prothrombin Time: 35.5 s — ABNORMAL HIGH (ref 11.4–15.2)

## 2023-05-28 ENCOUNTER — Inpatient Hospital Stay: Payer: Medicare Other

## 2023-05-28 DIAGNOSIS — I82401 Acute embolism and thrombosis of unspecified deep veins of right lower extremity: Secondary | ICD-10-CM

## 2023-05-28 DIAGNOSIS — I82409 Acute embolism and thrombosis of unspecified deep veins of unspecified lower extremity: Secondary | ICD-10-CM

## 2023-05-28 DIAGNOSIS — I2601 Septic pulmonary embolism with acute cor pulmonale: Secondary | ICD-10-CM

## 2023-05-28 DIAGNOSIS — I82551 Chronic embolism and thrombosis of right peroneal vein: Secondary | ICD-10-CM | POA: Diagnosis not present

## 2023-05-28 DIAGNOSIS — I2609 Other pulmonary embolism with acute cor pulmonale: Secondary | ICD-10-CM

## 2023-05-28 DIAGNOSIS — I824Z1 Acute embolism and thrombosis of unspecified deep veins of right distal lower extremity: Secondary | ICD-10-CM

## 2023-05-28 DIAGNOSIS — Z7901 Long term (current) use of anticoagulants: Secondary | ICD-10-CM

## 2023-05-28 LAB — PROTIME-INR
INR: 3.1 — ABNORMAL HIGH (ref 0.8–1.2)
Prothrombin Time: 32.4 s — ABNORMAL HIGH (ref 11.4–15.2)

## 2023-06-18 ENCOUNTER — Ambulatory Visit: Payer: Self-pay | Admitting: Hematology & Oncology

## 2023-06-18 ENCOUNTER — Encounter: Payer: Self-pay | Admitting: Family

## 2023-06-18 ENCOUNTER — Other Ambulatory Visit: Payer: Self-pay

## 2023-06-18 ENCOUNTER — Inpatient Hospital Stay (HOSPITAL_BASED_OUTPATIENT_CLINIC_OR_DEPARTMENT_OTHER): Payer: PRIVATE HEALTH INSURANCE | Admitting: Family

## 2023-06-18 ENCOUNTER — Inpatient Hospital Stay: Payer: PRIVATE HEALTH INSURANCE | Attending: Hematology & Oncology

## 2023-06-18 VITALS — BP 134/74 | HR 62 | Temp 98.1°F | Resp 18 | Wt 236.0 lb

## 2023-06-18 DIAGNOSIS — I82551 Chronic embolism and thrombosis of right peroneal vein: Secondary | ICD-10-CM | POA: Diagnosis present

## 2023-06-18 DIAGNOSIS — I82409 Acute embolism and thrombosis of unspecified deep veins of unspecified lower extremity: Secondary | ICD-10-CM

## 2023-06-18 DIAGNOSIS — I2609 Other pulmonary embolism with acute cor pulmonale: Secondary | ICD-10-CM

## 2023-06-18 DIAGNOSIS — I824Z1 Acute embolism and thrombosis of unspecified deep veins of right distal lower extremity: Secondary | ICD-10-CM

## 2023-06-18 DIAGNOSIS — I2601 Septic pulmonary embolism with acute cor pulmonale: Secondary | ICD-10-CM

## 2023-06-18 DIAGNOSIS — Z7901 Long term (current) use of anticoagulants: Secondary | ICD-10-CM | POA: Diagnosis not present

## 2023-06-18 DIAGNOSIS — Z7902 Long term (current) use of antithrombotics/antiplatelets: Secondary | ICD-10-CM | POA: Insufficient documentation

## 2023-06-18 DIAGNOSIS — I82401 Acute embolism and thrombosis of unspecified deep veins of right lower extremity: Secondary | ICD-10-CM

## 2023-06-18 LAB — CBC WITH DIFFERENTIAL (CANCER CENTER ONLY)
Abs Immature Granulocytes: 0.01 10*3/uL (ref 0.00–0.07)
Basophils Absolute: 0 10*3/uL (ref 0.0–0.1)
Basophils Relative: 1 %
Eosinophils Absolute: 0.1 10*3/uL (ref 0.0–0.5)
Eosinophils Relative: 2 %
HCT: 43.6 % (ref 39.0–52.0)
Hemoglobin: 15 g/dL (ref 13.0–17.0)
Immature Granulocytes: 0 %
Lymphocytes Relative: 33 %
Lymphs Abs: 1.3 10*3/uL (ref 0.7–4.0)
MCH: 29.7 pg (ref 26.0–34.0)
MCHC: 34.4 g/dL (ref 30.0–36.0)
MCV: 86.3 fL (ref 80.0–100.0)
Monocytes Absolute: 0.4 10*3/uL (ref 0.1–1.0)
Monocytes Relative: 9 %
Neutro Abs: 2.1 10*3/uL (ref 1.7–7.7)
Neutrophils Relative %: 55 %
Platelet Count: 123 10*3/uL — ABNORMAL LOW (ref 150–400)
RBC: 5.05 MIL/uL (ref 4.22–5.81)
RDW: 14.4 % (ref 11.5–15.5)
WBC Count: 3.9 10*3/uL — ABNORMAL LOW (ref 4.0–10.5)
nRBC: 0 % (ref 0.0–0.2)

## 2023-06-18 LAB — CMP (CANCER CENTER ONLY)
ALT: 28 U/L (ref 0–44)
AST: 29 U/L (ref 15–41)
Albumin: 4.4 g/dL (ref 3.5–5.0)
Alkaline Phosphatase: 77 U/L (ref 38–126)
Anion gap: 8 (ref 5–15)
BUN: 25 mg/dL — ABNORMAL HIGH (ref 8–23)
CO2: 27 mmol/L (ref 22–32)
Calcium: 8.7 mg/dL — ABNORMAL LOW (ref 8.9–10.3)
Chloride: 107 mmol/L (ref 98–111)
Creatinine: 1.14 mg/dL (ref 0.61–1.24)
GFR, Estimated: >60 mL/min (ref >60)
Glucose, Bld: 129 mg/dL — ABNORMAL HIGH (ref 70–99)
Potassium: 4.1 mmol/L (ref 3.5–5.1)
Sodium: 142 mmol/L (ref 135–145)
Total Bilirubin: 0.7 mg/dL (ref 0.0–1.2)
Total Protein: 6.5 g/dL (ref 6.5–8.1)

## 2023-06-18 LAB — PROTIME-INR
INR: 4.4 (ref 0.8–1.2)
Prothrombin Time: 41.9 s — ABNORMAL HIGH (ref 11.4–15.2)

## 2023-06-18 MED ORDER — WARFARIN SODIUM 7.5 MG PO TABS: 7.5000 mg | ORAL_TABLET | Freq: Every day | ORAL | 1 refills | Status: DC

## 2023-06-18 NOTE — Progress Notes (Signed)
 Hematology and Oncology Follow Up Visit  Glenn Wheeler [id number] 1956/01/11 68 y.o. 06/18/2023   Principle Diagnosis:  Occlusive thromboembolism of right peroneal vein -- recurrent Chronic DVT within the right femoral and popliteal veins   Current Therapy:        Pradaxa  150 mg p.o. twice daily -- start on 01/20/2022 -- d/c on 03/28/2022 Coumadin  10 mg po q day -- start on 04/07/2022 -- INR 2.5-3.5   Interim History:  Glenn Wheeler is here today for follow-up. He is doing well but has had some issues with mild HTN. His BP today is improved from previous weeks per patient.  No headaches, dizziness, fever, chills, n/v, cough, rash, SOB, chest pain, palpitations, abdominal pain or changes in bowel or bladder habits at this time.  He notes occasional palpitations and has been worked up by cardiology. He states this is felt to be musculoskeletal.  Chronic mild swelling in the left lower extremity. No pitting edema. Pedal pulses are 1+.  No numbness or tingling in his extremities at this time.  No falls or syncope reported.  Appetite and hydration are good. Weight is stable at 236 lbs.   ECOG Performance Status: 1 - Symptomatic but completely ambulatory  Medications:  Allergies as of 06/18/2023   No Known Allergies      Medication List        Accurate as of Jun 18, 2023  8:48 AM. If you have any questions, ask your nurse or doctor.          atorvastatin  40 MG tablet Commonly known as: LIPITOR Take 1 tablet (40 mg total) by mouth at bedtime.   metoprolol  succinate 25 MG 24 hr tablet Commonly known as: TOPROL -XL Take 1 tablet (25 mg total) by mouth daily.   multivitamin with minerals tablet Take 1 tablet by mouth at bedtime.   nitroGLYCERIN  0.4 MG SL tablet Commonly known as: NITROSTAT  Place 1 tablet (0.4 mg total) under the tongue every 5 (five) minutes as needed for chest pain.   promethazine-dextromethorphan 6.25-15 MG/5ML syrup Commonly known as: PROMETHAZINE-DM Take  5 mLs by mouth 4 (four) times daily as needed for cough.   sildenafil 20 MG tablet Commonly known as: REVATIO Take 20 mg by mouth as needed (erectile disfunction).   tamsulosin 0.4 MG Caps capsule Commonly known as: FLOMAX Take 0.4 mg by mouth daily.   TYLENOL  PO Take 1 tablet by mouth as needed (pain).   warfarin 10 MG tablet Commonly known as: COUMADIN  Take as directed by the anticoagulation clinic. If you are unsure how to take this medication, talk to your nurse or doctor. Original instructions: TAKE 1 TABLET BY MOUTH EVERY DAY        Allergies: No Known Allergies  Past Medical History, Surgical history, Social history, and Family History were reviewed and updated.  Review of Systems: All other 10 point review of systems is negative.   Physical Exam:  vitals were not taken for this visit.   Wt Readings from Last 3 Encounters:  03/05/23 235 lb 6.4 oz (106.8 kg)  09/14/22 233 lb (105.7 kg)  08/09/22 233 lb (105.7 kg)    Ocular: Sclerae unicteric, pupils equal, round and reactive to light Ear-nose-throat: Oropharynx clear, dentition fair Lymphatic: No cervical or supraclavicular adenopathy Lungs no rales or rhonchi, good excursion bilaterally Heart regular rate and rhythm, no murmur appreciated Abd soft, nontender, positive bowel sounds MSK no focal spinal tenderness, no joint edema Neuro: non-focal, well-oriented, appropriate affect Breasts: Deferred  Lab Results  Component Value Date   WBC 4.3 03/05/2023   HGB 14.2 03/05/2023   HCT 42.5 03/05/2023   MCV 88.0 03/05/2023   PLT 90 (L) 03/05/2023   No results found for: "FERRITIN", "IRON", "TIBC", "UIBC", "IRONPCTSAT" Lab Results  Component Value Date   RBC 4.83 03/05/2023   No results found for: "KPAFRELGTCHN", "LAMBDASER", "KAPLAMBRATIO" No results found for: "IGGSERUM", "IGA", "IGMSERUM" No results found for: "TOTALPROTELP", "ALBUMINELP", "A1GS", "A2GS", "BETS", "BETA2SER", "GAMS", "MSPIKE", "SPEI"    Chemistry      Component Value Date/Time   NA 142 03/05/2023 1438   NA 143 06/05/2013 0919   K 4.0 03/05/2023 1438   CL 106 03/05/2023 1438   CO2 28 03/05/2023 1438   BUN 28 (H) 03/05/2023 1438   BUN 29 (H) 06/05/2013 0919   CREATININE 1.17 03/05/2023 1438   CREATININE 1.92 (H) 04/28/2013 1027      Component Value Date/Time   CALCIUM  8.5 (L) 03/05/2023 1438   ALKPHOS 81 03/05/2023 1438   AST 41 03/05/2023 1438   ALT 40 03/05/2023 1438   BILITOT 0.7 03/05/2023 1438       Impression and Plan: Glenn Wheeler is a pleasant 68 yo gentleman with history of recurrent DVT and chronic DVT in the right lower extremity.  He recurred while on Pradaxa  and is now tolerating Coumadin  nicely.  So far there has been no new recurrence.  INR is pending. No change in Coumadin  dose 10 mg PO daily at this time.  INR check every 4 weeks, follow-up in 4 months.   Kennard Pea, NP 5/19/20258:48 AM

## 2023-06-26 ENCOUNTER — Inpatient Hospital Stay

## 2023-06-26 ENCOUNTER — Encounter: Payer: Self-pay | Admitting: *Deleted

## 2023-06-26 DIAGNOSIS — I824Z1 Acute embolism and thrombosis of unspecified deep veins of right distal lower extremity: Secondary | ICD-10-CM

## 2023-06-26 DIAGNOSIS — I82409 Acute embolism and thrombosis of unspecified deep veins of unspecified lower extremity: Secondary | ICD-10-CM

## 2023-06-26 DIAGNOSIS — I82551 Chronic embolism and thrombosis of right peroneal vein: Secondary | ICD-10-CM | POA: Diagnosis not present

## 2023-06-26 DIAGNOSIS — I2601 Septic pulmonary embolism with acute cor pulmonale: Secondary | ICD-10-CM

## 2023-06-26 DIAGNOSIS — I2609 Other pulmonary embolism with acute cor pulmonale: Secondary | ICD-10-CM

## 2023-06-26 LAB — PROTIME-INR
INR: 2.3 — ABNORMAL HIGH (ref 0.8–1.2)
Prothrombin Time: 25.6 s — ABNORMAL HIGH (ref 11.4–15.2)

## 2023-06-27 ENCOUNTER — Ambulatory Visit (INDEPENDENT_AMBULATORY_CARE_PROVIDER_SITE_OTHER): Admitting: Gastroenterology

## 2023-06-27 ENCOUNTER — Encounter: Payer: Self-pay | Admitting: Gastroenterology

## 2023-06-27 VITALS — BP 130/78 | HR 68 | Ht 73.0 in | Wt 235.0 lb

## 2023-06-27 DIAGNOSIS — Z86718 Personal history of other venous thrombosis and embolism: Secondary | ICD-10-CM | POA: Diagnosis not present

## 2023-06-27 DIAGNOSIS — Z1211 Encounter for screening for malignant neoplasm of colon: Secondary | ICD-10-CM | POA: Insufficient documentation

## 2023-06-27 DIAGNOSIS — Z86711 Personal history of pulmonary embolism: Secondary | ICD-10-CM

## 2023-06-27 DIAGNOSIS — Z01818 Encounter for other preprocedural examination: Secondary | ICD-10-CM | POA: Diagnosis not present

## 2023-06-27 DIAGNOSIS — Z7901 Long term (current) use of anticoagulants: Secondary | ICD-10-CM | POA: Diagnosis not present

## 2023-06-27 MED ORDER — NA SULFATE-K SULFATE-MG SULF 17.5-3.13-1.6 GM/177ML PO SOLN
1.0000 | Freq: Once | ORAL | 0 refills | Status: AC
Start: 2023-06-27 — End: 2023-06-27

## 2023-06-27 NOTE — Patient Instructions (Signed)
 You have been scheduled for a colonoscopy. Please follow written instructions given to you at your visit today.   If you use inhalers (even only as needed), please bring them with you on the day of your procedure.  DO NOT TAKE 7 DAYS PRIOR TO TEST- Trulicity (dulaglutide) Ozempic, Wegovy (semaglutide) Mounjaro (tirzepatide) Bydureon Bcise (exanatide extended release)  DO NOT TAKE 1 DAY PRIOR TO YOUR TEST Rybelsus (semaglutide) Adlyxin (lixisenatide) Victoza (liraglutide) Byetta (exanatide) _____________________________________________________________________  _______________________________________________________  If your blood pressure at your visit was 140/90 or greater, please contact your primary care physician to follow up on this.  _______________________________________________________  If you are age 75 or older, your body mass index should be between 23-30. Your Body mass index is 31 kg/m. If this is out of the aforementioned range listed, please consider follow up with your Primary Care Provider.  If you are age 3 or younger, your body mass index should be between 19-25. Your Body mass index is 31 kg/m. If this is out of the aformentioned range listed, please consider follow up with your Primary Care Provider.   ________________________________________________________  The [facility] providers would like to encourage you to use MYCHART to communicate with providers for non-urgent requests or questions.  Due to long hold times on the telephone, sending your provider a message by [facility] may be a faster and more efficient way to get a response.  Please allow 48 business hours for a response.  Please remember that this is for non-urgent requests.  _______________________________________________________

## 2023-06-27 NOTE — Progress Notes (Signed)
 06/27/2023 Glenn Wheeler [id number] Nov 09, 1955   HISTORY OF PRESENT ILLNESS:  This is a 68 year old male who is a patient of Dr. Jadene Maxwell.  He has history of DVT/PEs and is on lifelong anticoagulation with coumadin  that is prescribed by Dr. Maria Shiner.  His last colonoscopy was in 07/2013 and was normal with 10 year recall.  He does not have any GI complaints.  Moves his bowels regularly.  No rectal bleeding.  Says that he has developed some congestion over that past day or so, causing some cough.   Past Medical History:  Diagnosis Date   Chest pain    Colon polyps    2001, 2007   DVT (deep venous thrombosis) (HCC)    Exertional angina (HCC) 07/22/2018   History of chicken pox    childhood   Hyperlipidemia    Leishmaniasis 05/2011   Pulmonary embolus [facility])    Past Surgical History:  Procedure Laterality Date   CATARACT EXTRACTION, BILATERAL  [facility]/31/2007   [place]   COLONOSCOPY  [phone]   polyps removed both times   CORONARY ULTRASOUND/IVUS N/A 07/23/2018   Procedure: Intravascular Ultrasound/IVUS;  Surgeon: Cody Das, MD;  Location: [facility] INVASIVE CV LAB;  Service: Cardiovascular;  Laterality: N/A;   LAPAROSCOPIC APPENDECTOMY N/A 05/11/2022   Procedure: APPENDECTOMY LAPAROSCOPIC;  Surgeon: Candyce Champagne, MD;  Location: [facility] ORS;  Service: General;  Laterality: N/A;   LEFT HEART CATH AND CORONARY ANGIOGRAPHY N/A 07/23/2018   Procedure: LEFT HEART CATH AND CORONARY ANGIOGRAPHY;  Surgeon: Cody Das, MD;  Location: [facility] INVASIVE CV LAB;  Service: Cardiovascular;  Laterality: N/A;   ROTATOR CUFF REPAIR  01/30/2005   left rotator [facility]   TENDON REPAIR  01/31/2003   right peroneal tendon-[facility]    reports that he quit smoking about 40 years ago. His smoking use included cigarettes. He started smoking about 47 years ago. He has a 7 pack-year smoking history. He has never used smokeless tobacco. He reports  that he does not drink alcohol  and does not use drugs. family history includes Asthma in his mother; Clotting disorder in his father; Diabetes in his mother; Hyperlipidemia in his mother; Hypertension in his mother; Prostate cancer in his father; Stroke in his maternal grandfather and paternal grandfather. No Known Allergies    Outpatient Encounter Medications as of 06/27/2023  Medication Sig   Acetaminophen  (TYLENOL  PO) Take 1 tablet by mouth as needed (pain).   atorvastatin  (LIPITOR) 40 MG tablet Take 1 tablet (40 mg total) by mouth at bedtime.   MELATONIN PO Take by mouth at bedtime as needed.   metoprolol  succinate (TOPROL -XL) 25 MG 24 hr tablet Take 1 tablet (25 mg total) by mouth daily.   Multiple Vitamins-Minerals (MULTIVITAMIN WITH MINERALS) tablet Take 1 tablet by mouth at bedtime.   sildenafil (REVATIO) 20 MG tablet Take 20 mg by mouth as needed (erectile disfunction).   tamsulosin (FLOMAX) 0.4 MG CAPS capsule Take 0.4 mg by mouth daily.   warfarin (COUMADIN ) 10 MG tablet TAKE 1 TABLET BY MOUTH EVERY DAY   warfarin (COUMADIN ) 7.5 MG tablet Take 1 tablet (7.5 mg total) by mouth daily.   Cholecalciferol (VITAMIN D3) 10 MCG (400 UNIT) tablet Take 400 Units by mouth daily. *unsure dose* (Patient not taking: Reported on 06/27/2023)   nitroGLYCERIN  (NITROSTAT ) 0.4 MG SL tablet Place 1 tablet (0.4 mg total) under the tongue every 5 (five) minutes as needed for chest pain.   promethazine-dextromethorphan (PROMETHAZINE-DM) 6.25-15 MG/5ML  syrup Take 5 mLs by mouth 4 (four) times daily as needed for cough. (Patient not taking: Reported on 06/27/2023)   [DISCONTINUED] fluticasone  (FLONASE ) 50 MCG/ACT nasal spray Place 1 spray into the nose daily. (Patient not taking: Reported on 06/18/2023)   No facility-administered encounter medications on file as of 06/27/2023.    REVIEW OF SYSTEMS  : All other systems reviewed and negative except where noted in the History of Present Illness.   PHYSICAL  EXAM: BP 130/78   Pulse 68   Ht 6\' 1"  (1.854 m)   Wt 235 lb (106.6 kg)   BMI 31.00 kg/m  General: Well developed white male in no acute distress Head: Normocephalic and atraumatic Eyes:  Sclerae anicteric, conjunctiva pink. Ears: Normal auditory acuity Lungs: Clear throughout to auscultation; no W/R/R. Heart: Regular rate and rhythm; no M/R/G. Rectal:  Will be done at the time of colonoscopy. Musculoskeletal: Symmetrical with no gross deformities  Skin: No lesions on visible extremities Neurological: Alert oriented x 4, grossly non-focal Psychological:  Alert and cooperative. Normal mood and affect  ASSESSMENT AND PLAN: *CRC screening:  Last colonoscopy 07/2013.  Will schedule with Dr. Willy Harvest.  No complaints. *Chronic anticoagulation with coumadin  for history of DVTs/PEs:  Will hold coumadin  for 5 days prior to endoscopic procedures - will instruct when and how to resume after procedure. Benefits and risks of procedure explained including risks of bleeding, perforation, infection, missed lesions, reactions to medications and possible need for hospitalization and surgery for complications. Additional rare but real risk of stroke or other vascular clotting events off of coumadin  also explained and need to seek urgent help if any signs of these problems occur. Will communicate by phone or EMR with patient's prescribing provider, Dr. Maria Shiner, to confirm that holding coumadin  is reasonable in this case.     CC:  Diona Franklin, FNP

## 2023-07-03 ENCOUNTER — Encounter: Payer: Self-pay | Admitting: *Deleted

## 2023-07-03 ENCOUNTER — Inpatient Hospital Stay: Attending: Hematology & Oncology

## 2023-07-03 DIAGNOSIS — I2609 Other pulmonary embolism with acute cor pulmonale: Secondary | ICD-10-CM

## 2023-07-03 DIAGNOSIS — I82409 Acute embolism and thrombosis of unspecified deep veins of unspecified lower extremity: Secondary | ICD-10-CM

## 2023-07-03 DIAGNOSIS — I82551 Chronic embolism and thrombosis of right peroneal vein: Secondary | ICD-10-CM | POA: Insufficient documentation

## 2023-07-03 DIAGNOSIS — I2601 Septic pulmonary embolism with acute cor pulmonale: Secondary | ICD-10-CM

## 2023-07-03 DIAGNOSIS — I824Z1 Acute embolism and thrombosis of unspecified deep veins of right distal lower extremity: Secondary | ICD-10-CM

## 2023-07-03 LAB — CBC WITH DIFFERENTIAL (CANCER CENTER ONLY)
Abs Immature Granulocytes: 0.01 10*3/uL (ref 0.00–0.07)
Basophils Absolute: 0.1 10*3/uL (ref 0.0–0.1)
Basophils Relative: 1 %
Eosinophils Absolute: 0.1 10*3/uL (ref 0.0–0.5)
Eosinophils Relative: 2 %
HCT: 42.9 % (ref 39.0–52.0)
Hemoglobin: 14.5 g/dL (ref 13.0–17.0)
Immature Granulocytes: 0 %
Lymphocytes Relative: 31 %
Lymphs Abs: 2 10*3/uL (ref 0.7–4.0)
MCH: 29.4 pg (ref 26.0–34.0)
MCHC: 33.8 g/dL (ref 30.0–36.0)
MCV: 87 fL (ref 80.0–100.0)
Monocytes Absolute: 0.6 10*3/uL (ref 0.1–1.0)
Monocytes Relative: 9 %
Neutro Abs: 3.7 10*3/uL (ref 1.7–7.7)
Neutrophils Relative %: 57 %
Platelet Count: 122 10*3/uL — ABNORMAL LOW (ref 150–400)
RBC: 4.93 MIL/uL (ref 4.22–5.81)
RDW: 14.5 % (ref 11.5–15.5)
WBC Count: 6.5 10*3/uL (ref 4.0–10.5)
nRBC: 0 % (ref 0.0–0.2)

## 2023-07-03 LAB — PROTIME-INR
INR: 2.4 — ABNORMAL HIGH (ref 0.8–1.2)
Prothrombin Time: 26.7 s — ABNORMAL HIGH (ref 11.4–15.2)

## 2023-07-05 ENCOUNTER — Telehealth: Payer: Self-pay | Admitting: Gastroenterology

## 2023-07-05 NOTE — Telephone Encounter (Signed)
 Inbound call from patient, would like suprep called into [facility] at [address] in [place] due to price difference.

## 2023-07-06 MED ORDER — NA SULFATE-K SULFATE-MG SULF 17.5-3.13-1.6 GM/177ML PO SOLN: 1.0000 | Freq: Once | ORAL | 0 refills | Status: AC

## 2023-07-06 NOTE — Telephone Encounter (Signed)
 Script sent to pharmacy.

## 2023-07-10 ENCOUNTER — Other Ambulatory Visit: Payer: Self-pay | Admitting: Hematology & Oncology

## 2023-07-16 ENCOUNTER — Ambulatory Visit: Payer: Self-pay | Admitting: Hematology & Oncology

## 2023-07-16 ENCOUNTER — Inpatient Hospital Stay

## 2023-07-16 ENCOUNTER — Ambulatory Visit: Payer: Self-pay

## 2023-07-16 ENCOUNTER — Encounter: Payer: Self-pay | Admitting: *Deleted

## 2023-07-16 DIAGNOSIS — I82551 Chronic embolism and thrombosis of right peroneal vein: Secondary | ICD-10-CM | POA: Diagnosis not present

## 2023-07-16 DIAGNOSIS — I82409 Acute embolism and thrombosis of unspecified deep veins of unspecified lower extremity: Secondary | ICD-10-CM

## 2023-07-16 DIAGNOSIS — I2601 Septic pulmonary embolism with acute cor pulmonale: Secondary | ICD-10-CM

## 2023-07-16 DIAGNOSIS — I824Z1 Acute embolism and thrombosis of unspecified deep veins of right distal lower extremity: Secondary | ICD-10-CM

## 2023-07-16 DIAGNOSIS — I2609 Other pulmonary embolism with acute cor pulmonale: Secondary | ICD-10-CM

## 2023-07-16 LAB — CBC WITH DIFFERENTIAL (CANCER CENTER ONLY)
Abs Immature Granulocytes: 0.01 10*3/uL (ref 0.00–0.07)
Basophils Absolute: 0 10*3/uL (ref 0.0–0.1)
Basophils Relative: 1 %
Eosinophils Absolute: 0.1 10*3/uL (ref 0.0–0.5)
Eosinophils Relative: 2 %
HCT: 42.3 % (ref 39.0–52.0)
Hemoglobin: 14.4 g/dL (ref 13.0–17.0)
Immature Granulocytes: 0 %
Lymphocytes Relative: 29 %
Lymphs Abs: 1.3 10*3/uL (ref 0.7–4.0)
MCH: 29.7 pg (ref 26.0–34.0)
MCHC: 34 g/dL (ref 30.0–36.0)
MCV: 87.2 fL (ref 80.0–100.0)
Monocytes Absolute: 0.4 10*3/uL (ref 0.1–1.0)
Monocytes Relative: 10 %
Neutro Abs: 2.6 10*3/uL (ref 1.7–7.7)
Neutrophils Relative %: 58 %
Platelet Count: 120 10*3/uL — ABNORMAL LOW (ref 150–400)
RBC: 4.85 MIL/uL (ref 4.22–5.81)
RDW: 14.5 % (ref 11.5–15.5)
WBC Count: 4.5 10*3/uL (ref 4.0–10.5)
nRBC: 0 % (ref 0.0–0.2)

## 2023-07-16 LAB — PROTIME-INR
INR: 3.2 — ABNORMAL HIGH (ref 0.8–1.2)
Prothrombin Time: 32.6 s — ABNORMAL HIGH (ref 11.4–15.2)

## 2023-07-30 ENCOUNTER — Inpatient Hospital Stay

## 2023-07-30 ENCOUNTER — Encounter: Payer: Self-pay | Admitting: *Deleted

## 2023-07-30 DIAGNOSIS — I82409 Acute embolism and thrombosis of unspecified deep veins of unspecified lower extremity: Secondary | ICD-10-CM

## 2023-07-30 DIAGNOSIS — I2609 Other pulmonary embolism with acute cor pulmonale: Secondary | ICD-10-CM

## 2023-07-30 DIAGNOSIS — I82551 Chronic embolism and thrombosis of right peroneal vein: Secondary | ICD-10-CM | POA: Diagnosis not present

## 2023-07-30 DIAGNOSIS — I2601 Septic pulmonary embolism with acute cor pulmonale: Secondary | ICD-10-CM

## 2023-07-30 DIAGNOSIS — I824Z1 Acute embolism and thrombosis of unspecified deep veins of right distal lower extremity: Secondary | ICD-10-CM

## 2023-07-30 LAB — CMP (CANCER CENTER ONLY)
ALT: 29 U/L (ref 0–44)
AST: 25 U/L (ref 15–41)
Albumin: 4.2 g/dL (ref 3.5–5.0)
Alkaline Phosphatase: 90 U/L (ref 38–126)
Anion gap: 9 (ref 5–15)
BUN: 28 mg/dL — ABNORMAL HIGH (ref 8–23)
CO2: 26 mmol/L (ref 22–32)
Calcium: 9 mg/dL (ref 8.9–10.3)
Chloride: 106 mmol/L (ref 98–111)
Creatinine: 1.31 mg/dL — ABNORMAL HIGH (ref 0.61–1.24)
GFR, Estimated: 59 mL/min — ABNORMAL LOW (ref >60)
Glucose, Bld: 164 mg/dL — ABNORMAL HIGH (ref 70–99)
Potassium: 4 mmol/L (ref 3.5–5.1)
Sodium: 141 mmol/L (ref 135–145)
Total Bilirubin: 0.8 mg/dL (ref 0.0–1.2)
Total Protein: 6.8 g/dL (ref 6.5–8.1)

## 2023-07-30 LAB — CBC WITH DIFFERENTIAL (CANCER CENTER ONLY)
Abs Immature Granulocytes: 0.01 10*3/uL (ref 0.00–0.07)
Basophils Absolute: 0 10*3/uL (ref 0.0–0.1)
Basophils Relative: 1 %
Eosinophils Absolute: 0.1 10*3/uL (ref 0.0–0.5)
Eosinophils Relative: 2 %
HCT: 43.8 % (ref 39.0–52.0)
Hemoglobin: 14.8 g/dL (ref 13.0–17.0)
Immature Granulocytes: 0 %
Lymphocytes Relative: 34 %
Lymphs Abs: 1.5 10*3/uL (ref 0.7–4.0)
MCH: 29.4 pg (ref 26.0–34.0)
MCHC: 33.8 g/dL (ref 30.0–36.0)
MCV: 86.9 fL (ref 80.0–100.0)
Monocytes Absolute: 0.3 10*3/uL (ref 0.1–1.0)
Monocytes Relative: 7 %
Neutro Abs: 2.4 10*3/uL (ref 1.7–7.7)
Neutrophils Relative %: 56 %
Platelet Count: 123 10*3/uL — ABNORMAL LOW (ref 150–400)
RBC: 5.04 MIL/uL (ref 4.22–5.81)
RDW: 14.2 % (ref 11.5–15.5)
WBC Count: 4.3 10*3/uL (ref 4.0–10.5)
nRBC: 0 % (ref 0.0–0.2)

## 2023-07-30 LAB — PROTIME-INR
INR: 2.5 — ABNORMAL HIGH (ref 0.8–1.2)
Prothrombin Time: 28 s — ABNORMAL HIGH (ref 11.4–15.2)

## 2023-08-01 ENCOUNTER — Telehealth: Payer: Self-pay | Admitting: *Deleted

## 2023-08-01 NOTE — Telephone Encounter (Signed)
  Glenn Wheeler 09-Jan-1956 [id number]  08/01/23   Dear Dr. Timmy:  We have scheduled the above named patient for a(n) colonoscopy procedure. Our records show that (s)he is on anticoagulation therapy.  Please advise as to whether the patient may come off their therapy of Coumadin  5 days prior to their procedure which is scheduled for 08/16/23.  Please route your response to Powell Misty, CMA or fax response to [phone].  Sincerely,   Powell Misty, [facility]

## 2023-08-02 ENCOUNTER — Encounter: Payer: Self-pay | Admitting: *Deleted

## 2023-08-02 ENCOUNTER — Other Ambulatory Visit: Payer: Self-pay | Admitting: Hematology & Oncology

## 2023-08-02 DIAGNOSIS — I2693 Single subsegmental pulmonary embolism without acute cor pulmonale: Secondary | ICD-10-CM

## 2023-08-02 MED ORDER — ENOXAPARIN SODIUM 120 MG/0.8ML IJ SOSY: 120.0000 mg | PREFILLED_SYRINGE | INTRAMUSCULAR | 2 refills | Status: DC

## 2023-08-02 NOTE — Progress Notes (Unsigned)
 Pt called per Dr. Timmy and instructed to start Lovenox  120 mg/0.8 ml 5 days prior to colonoscopy, to stop Lovenox  the day prior to procedure and to restart Lovenox  with Coumadin  the day after procedure. Message sent to scheduling to bring pt in on Thursday, 08/09/23 to instruct pt how to inject Lovenox  and also for a PT/INR on 08/21/23 per order of Dr. Timmy. Pt currently scheduled for colonoscopy on 08/16/23.

## 2023-08-02 NOTE — Progress Notes (Signed)
 I spoke with Glenn Wheeler this afternoon.  Apparently, he will have a colonoscopy on 08/16/2023.  He needs to be on Lovenox  while he is off Coumadin .  He will take Lovenox  120 mg subcu daily starting 5 days before the procedure.  He will then stop the day before his colonoscopy.  He will then restart Lovenox  and 120 mg subcu daily along with his Coumadin  at 10 mg a day.  He will start this the day after his procedure.  He will have to have his PT/INR checked in 5 days after starting the Coumadin .  We will have him come into see one of our nurses next week so that they will be able to give him a tutorial about the Lovenox  administration.  He does understand this.   Jeralyn Crease, MD

## 2023-08-06 ENCOUNTER — Telehealth: Payer: Self-pay | Admitting: *Deleted

## 2023-08-06 ENCOUNTER — Other Ambulatory Visit: Payer: Self-pay | Admitting: *Deleted

## 2023-08-06 MED ORDER — ENOXAPARIN SODIUM 120 MG/0.8ML IJ SOSY: 120.0000 mg | PREFILLED_SYRINGE | INTRAMUSCULAR | 2 refills | Status: DC

## 2023-08-06 NOTE — Telephone Encounter (Signed)
 Patient called to discuss Lab appts and Lovenox  injections prior to Colonoscopy.  Patient said his wife is comfortable giving injections so he will have wife give Lovenox  on Thursday 7.17.25 which is 5 days prior to colonoscopy as directed.  Appt for injection cancelled.  Appt made for INR check 5 days after colonoscopy.  Patient comfortable will the above.

## 2023-08-13 ENCOUNTER — Encounter: Payer: Self-pay | Admitting: *Deleted

## 2023-08-13 ENCOUNTER — Inpatient Hospital Stay

## 2023-08-13 ENCOUNTER — Inpatient Hospital Stay: Attending: Hematology & Oncology

## 2023-08-13 DIAGNOSIS — I824Z1 Acute embolism and thrombosis of unspecified deep veins of right distal lower extremity: Secondary | ICD-10-CM

## 2023-08-13 DIAGNOSIS — I82551 Chronic embolism and thrombosis of right peroneal vein: Secondary | ICD-10-CM | POA: Diagnosis present

## 2023-08-13 DIAGNOSIS — I82409 Acute embolism and thrombosis of unspecified deep veins of unspecified lower extremity: Secondary | ICD-10-CM

## 2023-08-13 DIAGNOSIS — I2601 Septic pulmonary embolism with acute cor pulmonale: Secondary | ICD-10-CM

## 2023-08-13 DIAGNOSIS — I2609 Other pulmonary embolism with acute cor pulmonale: Secondary | ICD-10-CM

## 2023-08-13 LAB — PROTIME-INR
INR: 3.4 — ABNORMAL HIGH (ref 0.8–1.2)
Prothrombin Time: 36.2 s — ABNORMAL HIGH (ref 11.4–15.2)

## 2023-08-13 LAB — CBC WITH DIFFERENTIAL (CANCER CENTER ONLY)
Abs Immature Granulocytes: 0.01 K/uL (ref 0.00–0.07)
Basophils Absolute: 0 K/uL (ref 0.0–0.1)
Basophils Relative: 1 %
Eosinophils Absolute: 0.1 K/uL (ref 0.0–0.5)
Eosinophils Relative: 1 %
HCT: 43.1 % (ref 39.0–52.0)
Hemoglobin: 14.4 g/dL (ref 13.0–17.0)
Immature Granulocytes: 0 %
Lymphocytes Relative: 27 %
Lymphs Abs: 1.4 K/uL (ref 0.7–4.0)
MCH: 29.6 pg (ref 26.0–34.0)
MCHC: 33.4 g/dL (ref 30.0–36.0)
MCV: 88.5 fL (ref 80.0–100.0)
Monocytes Absolute: 0.5 K/uL (ref 0.1–1.0)
Monocytes Relative: 9 %
Neutro Abs: 3.4 K/uL (ref 1.7–7.7)
Neutrophils Relative %: 62 %
Platelet Count: 130 K/uL — ABNORMAL LOW (ref 150–400)
RBC: 4.87 MIL/uL (ref 4.22–5.81)
RDW: 14.4 % (ref 11.5–15.5)
WBC Count: 5.4 K/uL (ref 4.0–10.5)
nRBC: 0 % (ref 0.0–0.2)

## 2023-08-13 NOTE — Progress Notes (Signed)
 Pt in office for lab check and requests to come in on Thursday, 08/16/23 for education re: Lovenox  injections.  Appt made and time confirmed with pt.

## 2023-08-14 ENCOUNTER — Other Ambulatory Visit: Payer: Self-pay | Admitting: Medical Genetics

## 2023-08-16 ENCOUNTER — Inpatient Hospital Stay

## 2023-08-16 NOTE — Progress Notes (Signed)
 Pt. And wife entered clinic today for  teaching upon Lovenox  injections. Printed inform on correct steps in giving Lovenox  injections. Reviewed steps with wife. This nurse  then demonstrated correct injection technique . Pt's wife then demonstrated correct steps in administration of medication.Patients wife was able to inject Lovenox  correctly to patient. Pt and wife where instructed to call our center should issues occur in administration of Lovenox . Pt and wife  verbalized understanding of instructions.

## 2023-08-16 NOTE — Patient Instructions (Signed)
 Subcutaneous Injection Instructions Using a Prefilled Syringe A subcutaneous injection is a shot of medicine that is given into the layer of fat and tissue between skin and muscle. The injection is given with a single-use syringe that is already filled with medicine. The syringe is called a prefilled syringe. Read the medicine guide or package insert that came with the syringe. Follow directions from the guide about how to prepare and give the injection. This is important because the directions may be different for each medicine. Use only the syringe, needle, and medicine that your health care provider prescribes. Use each prefilled syringe and needle only one time. Supplies needed: Prefilled syringe with needle. Use the needle length and size (gauge) that your provider or pharmacist gives to you. Alcohol  wipes. Gauze. Bandage. A container to put used syringes. This may be a sharps container or a hard plastic container that has a secure lid, such as an empty laundry detergent bottle. How to choose a site for injection Follow instructions from your provider about where to give an injection. Do not inject in the same spot each time. There are five main areas that can be used for injecting. These areas include: Abdomen. Avoid the area that is within 2 inches (5 cm) of your navel (umbilicus). Front of thigh. Upper, outer side of thigh. Upper, outer side of arm. Upper, outer part of butt. How to give an injection using a prefilled syringe  Wash your hands with soap and water. If soap and water are not available, use hand sanitizer. Use an alcohol  wipe to clean the site where you will be injecting the needle. Let the site air-dry. Remove the plastic cover from the needle on the syringe. Do not let the needle touch anything. Hold the syringe with the needle pointing up. Check the syringe for any remaining air bubbles. If there are air bubbles, flick the syringe with your finger until the air bubbles  rise to the top. Then, gently push on the plunger until you can see a drop of medicine appear at the tip of the needle. This will clear any remaining air bubbles from the syringe. Hold the syringe in your writing hand like a pencil. Use your other hand to pinch and hold about an inch (2.5 cm) of skin. Do not directly touch the cleaned part of the skin. Gently but quickly, put the needle straight into the skin. The needle should be at a 90-degree angle to the skin. The needle may need to be injected at a 45-degree angle in thin adults or children who have a small amount of body fat. Follow instructions from your provider about the right size needle and angle you should use for the injection. After the needle is completely inserted into the skin, release the skin that you are pinching. Continue to hold the syringe with your writing hand. Use your thumb or index finger of your writing hand to push the plunger all the way into the syringe to inject the medicine. Pull the needle straight out of the skin. If there is bleeding: Press and hold a piece of gauze over the injection site until bleeding stops. Do not rub the area. Cover the injection site with a bandage, if needed. How to safely throw away the supplies If you are using a syringe that does not have a safety system for shielding the needle after injection: Do not recap the needle. Place the syringe and needle in the disposal container. If your syringe has a safety  system for shielding the needle after injection: Firmly push down on the plunger after you complete the injection. The protective sleeve will automatically cover the needle, and you will hear a click. The click means that the needle is safely covered. Follow the disposal regulations for the area where you live. Do not use any syringe or needle more than one time. Contact a health care provider if: You have trouble giving the injection. You think that the injection was not given  correctly. You have trouble with any of the supplies. The medicine causes side effects. You get a rash on your skin. A get a fever. The condition that is being treated gets worse. Get help right away if: You get any of these symptoms after the injection is given: Trouble breathing. Chest pain. A rash over most or all of your body. Swelling of the lips or tongue. Trouble swallowing. These symptoms may be an emergency. Get help right away. Call 911. Do not wait to see if the symptoms will go away. Do not drive yourself to the hospital. This information is not intended to replace advice given to you by your health care provider. Make sure you discuss any questions you have with your health care provider. Document Revised: 11/03/2021 Document Reviewed: 11/03/2021 Elsevier Patient Education  2024 [employer].

## 2023-08-18 ENCOUNTER — Other Ambulatory Visit: Payer: Self-pay | Admitting: Hematology & Oncology

## 2023-08-21 ENCOUNTER — Ambulatory Visit (AMBULATORY_SURGERY_CENTER): Admitting: Internal Medicine

## 2023-08-21 ENCOUNTER — Encounter: Payer: Self-pay | Admitting: Internal Medicine

## 2023-08-21 VITALS — BP 122/79 | HR 53 | Temp 97.9°F | Resp 10 | Ht 73.0 in | Wt 235.0 lb

## 2023-08-21 DIAGNOSIS — D123 Benign neoplasm of transverse colon: Secondary | ICD-10-CM

## 2023-08-21 DIAGNOSIS — D120 Benign neoplasm of cecum: Secondary | ICD-10-CM

## 2023-08-21 DIAGNOSIS — D122 Benign neoplasm of ascending colon: Secondary | ICD-10-CM | POA: Diagnosis not present

## 2023-08-21 DIAGNOSIS — Z1211 Encounter for screening for malignant neoplasm of colon: Secondary | ICD-10-CM | POA: Diagnosis not present

## 2023-08-21 DIAGNOSIS — K635 Polyp of colon: Secondary | ICD-10-CM

## 2023-08-21 DIAGNOSIS — D124 Benign neoplasm of descending colon: Secondary | ICD-10-CM

## 2023-08-21 DIAGNOSIS — K648 Other hemorrhoids: Secondary | ICD-10-CM

## 2023-08-21 MED ORDER — SODIUM CHLORIDE 0.9 % IV SOLN: 500.0000 mL | INTRAVENOUS | Status: DC

## 2023-08-21 NOTE — Progress Notes (Signed)
 [facility] History and Physical   Primary Care Physician:  Claudene Laneta Bathe, FNP   Reason for Procedure:    Encounter Diagnosis  Name Primary?   Colon cancer screening Yes     Plan:    colonoscopy     HPI: Glenn Wheeler is a 68 y.o. male here for screening colonoscopy.  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  Coumadin  was held and he was bridged with Lovenox .  Coordinated by Dr. Timmy.  Last dose of enoxaparin  yesterday.  Normal colonoscopy 2015   Past Medical History:  Diagnosis Date   Chest pain    Colon polyps    2001, 2007   DVT (deep venous thrombosis) (HCC)    Exertional angina (HCC) 07/22/2018   History of chicken pox    childhood   Hyperlipidemia    Leishmaniasis 05/2011   Pulmonary embolus [facility])     Past Surgical History:  Procedure Laterality Date   CATARACT EXTRACTION, BILATERAL  01/31/2007   [place]   COLONOSCOPY  [phone]   polyps removed both times   CORONARY ULTRASOUND/IVUS N/A 07/23/2018   Procedure: Intravascular Ultrasound/IVUS;  Surgeon: Elmira Newman PARAS, MD;  Location: [facility] INVASIVE CV LAB;  Service: Cardiovascular;  Laterality: N/A;   LAPAROSCOPIC APPENDECTOMY N/A 05/11/2022   Procedure: APPENDECTOMY LAPAROSCOPIC;  Surgeon: Sheldon Standing, MD;  Location: [facility] ORS;  Service: General;  Laterality: N/A;   LEFT HEART CATH AND CORONARY ANGIOGRAPHY N/A 07/23/2018   Procedure: LEFT HEART CATH AND CORONARY ANGIOGRAPHY;  Surgeon: Elmira Newman PARAS, MD;  Location: [facility] INVASIVE CV LAB;  Service: Cardiovascular;  Laterality: N/A;   ROTATOR CUFF REPAIR  01/30/2005   left rotator cuff[facility]   TENDON REPAIR  01/31/2003   right peroneal tendon-[facility]    Prior to Admission medications   Medication Sig Start Date End Date Taking? Authorizing Provider  atorvastatin  (LIPITOR) 40 MG tablet Take 1 tablet (40 mg total) by mouth at bedtime. 05/12/21  Yes Patwardhan,  Manish J, MD  Cholecalciferol (VITAMIN D3) 10 MCG (400 UNIT) tablet Take 400 Units by mouth daily. *unsure dose*   Yes [provider]  enoxaparin  (LOVENOX ) 120 MG/0.8ML injection Inject 0.8 mLs (120 mg total) into the skin daily. Start 5 days PRIOR to procedure - stop day PRIOR to procedure.  Re-start with coumadin  day AFTER procedure 08/06/23  Yes Ennever, Maude SAUNDERS, MD  ibuprofen (ADVIL) 800 MG tablet 800 mg. 08/15/18  Yes [provider]  MELATONIN PO Take by mouth at bedtime as needed.   Yes [provider]  metoprolol  succinate (TOPROL -XL) 25 MG 24 hr tablet Take 1 tablet (25 mg total) by mouth daily. 01/11/23  Yes Patwardhan, Newman PARAS, MD  Multiple Vitamins-Minerals (MULTIVITAMIN WITH MINERALS) tablet Take 1 tablet by mouth at bedtime.   Yes [provider]  sildenafil (REVATIO) 20 MG tablet Take 20 mg by mouth as needed (erectile disfunction). 09/08/21  Yes [provider]  tamsulosin (FLOMAX) 0.4 MG CAPS capsule Take 0.4 mg by mouth daily. 07/01/22  Yes [provider]  Acetaminophen  (TYLENOL  PO) Take 1 tablet by mouth as needed (pain).    [provider]  nitroGLYCERIN  (NITROSTAT ) 0.4 MG SL tablet Place 1 tablet (0.4 mg total) under the tongue every 5 (five) minutes as needed for chest pain. Patient not taking: Reported on 08/21/2023 03/31/21 08/09/22  Elmira Newman PARAS, MD  warfarin (COUMADIN ) 10 MG tablet TAKE 1 TABLET BY MOUTH EVERY DAY 08/18/23  Timmy Maude SAUNDERS, MD  warfarin (COUMADIN ) 7.5 MG tablet TAKE 1 TABLET BY MOUTH EVERY DAY 07/10/23   Ennever, Peter R, MD  fluticasone  (FLONASE ) 50 MCG/ACT nasal spray Place 1 spray into the nose daily. Patient not taking: No sig reported 01/03/11 04/07/11  Eyvonne Debby ORN, MD    Current Outpatient Medications  Medication Sig Dispense Refill   atorvastatin  (LIPITOR) 40 MG tablet Take 1 tablet (40 mg total) by mouth at bedtime. 90 tablet 3   Cholecalciferol (VITAMIN D3) 10 MCG (400 UNIT)  tablet Take 400 Units by mouth daily. *unsure dose*     enoxaparin  (LOVENOX ) 120 MG/0.8ML injection Inject 0.8 mLs (120 mg total) into the skin daily. Start 5 days PRIOR to procedure - stop day PRIOR to procedure.  Re-start with coumadin  day AFTER procedure 8 mL 2   ibuprofen (ADVIL) 800 MG tablet 800 mg.     MELATONIN PO Take by mouth at bedtime as needed.     metoprolol  succinate (TOPROL -XL) 25 MG 24 hr tablet Take 1 tablet (25 mg total) by mouth daily. 90 tablet 2   Multiple Vitamins-Minerals (MULTIVITAMIN WITH MINERALS) tablet Take 1 tablet by mouth at bedtime.     sildenafil (REVATIO) 20 MG tablet Take 20 mg by mouth as needed (erectile disfunction).     tamsulosin (FLOMAX) 0.4 MG CAPS capsule Take 0.4 mg by mouth daily.     Acetaminophen  (TYLENOL  PO) Take 1 tablet by mouth as needed (pain).     nitroGLYCERIN  (NITROSTAT ) 0.4 MG SL tablet Place 1 tablet (0.4 mg total) under the tongue every 5 (five) minutes as needed for chest pain. (Patient not taking: Reported on 08/21/2023) 30 tablet 2   warfarin (COUMADIN ) 10 MG tablet TAKE 1 TABLET BY MOUTH EVERY DAY 90 tablet 1   warfarin (COUMADIN ) 7.5 MG tablet TAKE 1 TABLET BY MOUTH EVERY DAY 90 tablet 1   Current Facility-Administered Medications  Medication Dose Route Frequency Provider Last Rate Last Admin   0.9 %  sodium chloride  infusion  500 mL Intravenous Continuous Avram Lupita BRAVO, MD        Allergies as of 08/21/2023   (No Known Allergies)    Family History  Problem Relation Age of Onset   Hyperlipidemia Mother    Hypertension Mother    Diabetes Mother    Asthma Mother    Colon cancer Father    Prostate cancer Father        alive   Clotting disorder Father        blood clots   Stroke Maternal Grandfather    Stroke Paternal Grandfather    Heart attack Neg Hx    Sudden death Neg Hx    Rectal cancer Neg Hx    Stomach cancer Neg Hx     Social History   Socioeconomic History   Marital status: Married    Spouse name: Not on  file   Number of children: 3   Years of education: Not on file   Highest education level: Not on file  Occupational History   Occupation: retired  Tobacco Use   Smoking status: Former    Current packs/day: 0.00    Average packs/day: 1 pack/day for 7.0 years (7.0 ttl pk-yrs)    Types: Cigarettes    Start date: 06/19/1976    Quit date: 06/20/1983    Years since quitting: 40.1   Smokeless tobacco: Never   Tobacco comments:    quit 1984 1/2 ppd for 4 years  Vaping Use  Vaping status: Never Used  Substance and Sexual Activity   Alcohol  use: No   Drug use: No   Sexual activity: Not on file  Other Topics Concern   Not on file  Social History Narrative   Married, 3 children   Right handed   Masters degree   1 cup daily   Social Drivers of Health   Financial Resource Strain: Not on file  Food Insecurity: Low Risk  (07/24/2023)   Received from [facility]   Hunger Vital Sign    Within the past 12 months, you worried that your food would run out before you got money to buy more: Never true    Within the past 12 months, the food you bought just didn't last and you didn't have money to get more. : Never true  Transportation Needs: No Transportation Needs (07/24/2023)   Received from [facility]    In the past 12 months, has lack of reliable transportation kept you from medical appointments, meetings, work or from getting things needed for daily living? : No  Physical Activity: Not on file  Stress: Not on file  Social Connections: Not on file  Intimate Partner Violence: Not on file    Review of Systems:  All other review of systems negative except as mentioned in the HPI.  Physical Exam: Vital signs BP 125/69   Pulse 60   Temp 97.9 F (36.6 C) (Temporal)   Ht 6' 1 (1.854 m)   Wt 235 lb (106.6 kg)   SpO2 98%   BMI 31.00 kg/m   General:   Alert,  Well-developed, well-nourished, pleasant and cooperative in NAD Lungs:  Clear throughout to  auscultation.   Heart:  Regular rate and rhythm; no murmurs, clicks, rubs,  or gallops. Abdomen:  Soft, nontender and nondistended. Normal bowel sounds.   Neuro/Psych:  Alert and cooperative. Normal mood and affect. A and O x 3   @Jauna Raczynski  CHARLENA Commander, MD, [facility] [phone] (pager) 08/21/2023 8:03 AM@

## 2023-08-21 NOTE — Progress Notes (Signed)
 Called to room to assist during endoscopic procedure.  Patient ID and intended procedure confirmed with present staff. Received instructions for my participation in the procedure from the performing physician.

## 2023-08-21 NOTE — Progress Notes (Signed)
 Sedate, gd SR, tolerated procedure well, VSS, report to RN

## 2023-08-21 NOTE — Op Note (Signed)
 [facility] Patient Name: Glenn Wheeler Procedure Date: 08/21/2023 8:26 AM MRN: [id number] Endoscopist: Lupita FORBES Commander , MD, [id number] Age: 68 Referring MD:  Date of Birth: Jun 15, 1955 Gender: Male Account #: [id number] Procedure:                Colonoscopy Indications:              Screening for colorectal malignant neoplasm, Last                            colonoscopy: 2015 Medicines:                Monitored Anesthesia Care Procedure:                Pre-Anesthesia Assessment:                           - Prior to the procedure, a History and Physical                            was performed, and patient medications and                            allergies were reviewed. The patient's tolerance of                            previous anesthesia was also reviewed. The risks                            and benefits of the procedure and the sedation                            options and risks were discussed with the patient.                            All questions were answered, and informed consent                            was obtained. Prior Anticoagulants: The patient                            last took Coumadin  (warfarin) 5 days and Lovenox                             (enoxaparin ) 1 day prior to the procedure. ASA                            Grade Assessment: III - A patient with severe                            systemic disease. After reviewing the risks and                            benefits, the patient was deemed in satisfactory  condition to undergo the procedure.                           After obtaining informed consent, the colonoscope                            was passed under direct vision. Throughout the                            procedure, the patient's blood pressure, pulse, and                            oxygen saturations were monitored continuously. The                            Olympus Scope SN [id number] was introduced through  the                            anus and advanced to the the cecum, identified by                            appendiceal orifice and ileocecal valve. The                            colonoscopy was performed without difficulty. The                            patient tolerated the procedure well. The quality                            of the bowel preparation was good. The ileocecal                            valve, appendiceal orifice, and rectum were                            photographed. The bowel preparation used was SUPREP                            via split dose instruction. Scope In: 8:46:12 AM Scope Out: 9:03:02 AM Scope Withdrawal Time: 0 hours 14 minutes 31 seconds  Total Procedure Duration: 0 hours 16 minutes 50 seconds  Findings:                 The perianal and digital rectal examinations were                            normal. Pertinent negatives include normal prostate                            (size, shape, and consistency).                           Five sessile polyps were found in the descending  colon, transverse colon, ascending colon and cecum.                            The polyps were diminutive in size. These polyps                            were removed with a cold snare. Resection and                            retrieval were complete. Verification of patient                            identification for the specimen was done. Estimated                            blood loss was minimal.                           Internal hemorrhoids were found. The hemorrhoids                            were small.                           The exam was otherwise without abnormality on                            direct and retroflexion views. Complications:            No immediate complications. Estimated Blood Loss:     Estimated blood loss was minimal. Impression:               - Five diminutive polyps in the descending colon,                             in the transverse colon, in the ascending colon and                            in the cecum, removed with a cold snare. Resected                            and retrieved.                           - Internal hemorrhoids.                           - The examination was otherwise normal on direct                            and retroflexion views. Recommendation:           - Patient has a contact number available for                            emergencies. The signs and symptoms of potential  delayed complications were discussed with the                            patient. Return to normal activities tomorrow.                            Written discharge instructions were provided to the                            patient.                           - Resume Coumadin  (warfarin) tomorrow and Lovenox                             (enoxaparin ) tomorrow at prior doses. Refer to                            managing physician for further adjustment of                            therapy.                           - Repeat colonoscopy is recommended. The                            colonoscopy date will be determined after pathology                            results from today's exam become available for                            review. Lupita FORBES Commander, MD 08/21/2023 9:15:46 AM This report has been signed electronically.

## 2023-08-21 NOTE — Patient Instructions (Addendum)
 I found and removed 5 tiny polyps that look benign.  They will be analyzed and I will let you know what they were and when to repeat a routine colonoscopy.  I also saw some small internal hemorrhoids.  You should restart Lovenox  and Coumadin  tomorrow, this is what Dr. Timmy has recommended.  You should take 10 mg of warfarin tomorrow night and resume your regular dosing schedule after that.  You are supposed to have follow-up blood testing with Dr. Timmy as well please follow his recommendations regarding that.  I appreciate the opportunity to care for you. Lupita CHARLENA Commander, MD, [facility]  Please see handouts regarding Polyps and Hemorrhoids.  YOU HAD AN ENDOSCOPIC PROCEDURE TODAY AT THE [facility]:   Refer to the procedure report that was given to you for any specific questions about what was found during the examination.  If the procedure report does not answer your questions, please call your gastroenterologist to clarify.  If you requested that your care partner not be given the details of your procedure findings, then the procedure report has been included in a sealed envelope for you to review at your convenience later.  YOU SHOULD EXPECT: Some feelings of bloating in the abdomen. Passage of more gas than usual.  Walking can help get rid of the air that was put into your GI tract during the procedure and reduce the bloating. If you had a lower endoscopy (such as a colonoscopy or flexible sigmoidoscopy) you may notice spotting of blood in your stool or on the toilet paper. If you underwent a bowel prep for your procedure, you may not have a normal bowel movement for a few days.  Please Note:  You might notice some irritation and congestion in your nose or some drainage.  This is from the oxygen used during your procedure.  There is no need for concern and it should clear up in a day or so.  SYMPTOMS TO REPORT IMMEDIATELY:  Following lower endoscopy (colonoscopy or flexible  sigmoidoscopy):  Excessive amounts of blood in the stool  Significant tenderness or worsening of abdominal pains  Swelling of the abdomen that is new, acute  Fever of 100F or higher  For urgent or emergent issues, a gastroenterologist can be reached at any hour by calling [phone]. Do not use MyChart messaging for urgent concerns.    DIET:  We do recommend a small meal at first, but then you may proceed to your regular diet.  Drink plenty of fluids but you should avoid alcoholic beverages for 24 hours.  ACTIVITY:  You should plan to take it easy for the rest of today and you should NOT DRIVE or use heavy machinery until tomorrow (because of the sedation medicines used during the test).    FOLLOW UP: Our staff will call the number listed on your records the next business day following your procedure.  We will call around 7:15- 8:00 am to check on you and address any questions or concerns that you may have regarding the information given to you following your procedure. If we do not reach you, we will leave a message.     If any biopsies were taken you will be contacted by phone or by letter within the next 1-3 weeks.  Please call us  at [phone] if you have not heard about the biopsies in 3 weeks.    SIGNATURES/CONFIDENTIALITY: You and/or your care partner have signed paperwork which will be entered into your electronic medical  record.  These signatures attest to the fact that that the information above on your After Visit Summary has been reviewed and is understood.  Full responsibility of the confidentiality of this discharge information lies with you and/or your care-partner.

## 2023-08-22 ENCOUNTER — Telehealth: Payer: Self-pay

## 2023-08-22 NOTE — Telephone Encounter (Signed)
 No answer after follow up call. Voice message left.

## 2023-08-23 LAB — SURGICAL PATHOLOGY

## 2023-08-27 ENCOUNTER — Ambulatory Visit: Payer: Self-pay | Admitting: Hematology & Oncology

## 2023-08-27 ENCOUNTER — Inpatient Hospital Stay

## 2023-08-27 DIAGNOSIS — I82551 Chronic embolism and thrombosis of right peroneal vein: Secondary | ICD-10-CM | POA: Diagnosis not present

## 2023-08-27 DIAGNOSIS — I824Z1 Acute embolism and thrombosis of unspecified deep veins of right distal lower extremity: Secondary | ICD-10-CM

## 2023-08-27 LAB — PROTIME-INR
INR: 1.6 — ABNORMAL HIGH (ref 0.8–1.2)
Prothrombin Time: 19.9 s — ABNORMAL HIGH (ref 11.4–15.2)

## 2023-08-27 NOTE — Telephone Encounter (Signed)
-----   Message from Maude JONELLE Crease sent at 08/27/2023  9:54 AM EDT ----- The INR is only 1.6.  I think he got back on Coumadin  after having surgery.  Please recheck his INR in 3 days.  Thanks.  No change in his Coumadin  dose. ----- Message ----- From: Rebecka, Lab In [place] Sent: 08/27/2023   9:38 AM EDT To: Maude JONELLE Crease, MD

## 2023-08-27 NOTE — Telephone Encounter (Signed)
 RN called to relay message from Dr. Timmy regarding INR result. Patient does confirm he is currently taking Coumadin  10mg , which he restarted post procedure on 08/22/23. Per Dr. Timmy, patient is to continue taking same dose of Coumadin  as well as Lovenox  injections until we see his INR become therapeutic. Patient to return on 08/30/23 for lab only appointment for INR recheck. Patient verbalized understanding of POC.

## 2023-08-28 ENCOUNTER — Ambulatory Visit: Payer: Self-pay | Admitting: Internal Medicine

## 2023-08-28 ENCOUNTER — Other Ambulatory Visit

## 2023-08-28 DIAGNOSIS — Z006 Encounter for examination for normal comparison and control in clinical research program: Secondary | ICD-10-CM

## 2023-08-28 DIAGNOSIS — Z860101 Personal history of adenomatous and serrated colon polyps: Secondary | ICD-10-CM

## 2023-08-30 ENCOUNTER — Inpatient Hospital Stay

## 2023-08-30 ENCOUNTER — Ambulatory Visit: Payer: Self-pay | Admitting: Hematology & Oncology

## 2023-08-30 DIAGNOSIS — I82551 Chronic embolism and thrombosis of right peroneal vein: Secondary | ICD-10-CM | POA: Diagnosis not present

## 2023-08-30 DIAGNOSIS — I824Z1 Acute embolism and thrombosis of unspecified deep veins of right distal lower extremity: Secondary | ICD-10-CM

## 2023-08-30 LAB — PROTIME-INR
INR: 2.1 — ABNORMAL HIGH (ref 0.8–1.2)
Prothrombin Time: 24.6 s — ABNORMAL HIGH (ref 11.4–15.2)

## 2023-08-30 NOTE — Telephone Encounter (Signed)
-----   Message from Maude JONELLE Crease sent at 08/30/2023  1:13 PM EDT ----- Please call and let him know that the INR is 2.1.  I would not change the dose of Coumadin .  He can get off Lovenox  if he wants.  Please repeat the INR in 5 days.  Thanks.  Jeralyn ----- Message ----- From: Rebecka, Lab In [place] Sent: 08/30/2023   9:09 AM EDT To: Maude JONELLE Crease, MD

## 2023-08-30 NOTE — Telephone Encounter (Signed)
 Called patient to relay Dr. Jessy message regarding his INR results from today. Patient verbalized understanding to continue same dose of Coumadin  and okay to discontinue Lovenox  injections. Patient to return in 5 days for repeat labs and is aware this will be scheduled.

## 2023-09-04 ENCOUNTER — Ambulatory Visit: Payer: Self-pay | Admitting: Hematology & Oncology

## 2023-09-04 ENCOUNTER — Inpatient Hospital Stay: Attending: Hematology & Oncology

## 2023-09-04 DIAGNOSIS — I82551 Chronic embolism and thrombosis of right peroneal vein: Secondary | ICD-10-CM | POA: Diagnosis present

## 2023-09-04 DIAGNOSIS — I824Z1 Acute embolism and thrombosis of unspecified deep veins of right distal lower extremity: Secondary | ICD-10-CM

## 2023-09-04 DIAGNOSIS — Z7901 Long term (current) use of anticoagulants: Secondary | ICD-10-CM | POA: Diagnosis not present

## 2023-09-04 LAB — PROTIME-INR
INR: 2.7 — ABNORMAL HIGH (ref 0.8–1.2)
Prothrombin Time: 29.7 s — ABNORMAL HIGH (ref 11.4–15.2)

## 2023-09-08 LAB — GENECONNECT MOLECULAR SCREEN: Genetic Analysis Overall Interpretation: NEGATIVE

## 2023-09-10 ENCOUNTER — Encounter: Payer: Self-pay | Admitting: *Deleted

## 2023-09-10 ENCOUNTER — Ambulatory Visit: Payer: Self-pay | Admitting: Hematology & Oncology

## 2023-09-10 ENCOUNTER — Inpatient Hospital Stay

## 2023-09-10 DIAGNOSIS — I824Z1 Acute embolism and thrombosis of unspecified deep veins of right distal lower extremity: Secondary | ICD-10-CM

## 2023-09-10 DIAGNOSIS — I82551 Chronic embolism and thrombosis of right peroneal vein: Secondary | ICD-10-CM | POA: Diagnosis not present

## 2023-09-10 LAB — PROTIME-INR
INR: 3.3 — ABNORMAL HIGH (ref 0.8–1.2)
Prothrombin Time: 35.4 s — ABNORMAL HIGH (ref 11.4–15.2)

## 2023-09-13 ENCOUNTER — Ambulatory Visit: Payer: Medicare Other | Admitting: Cardiology

## 2023-09-14 ENCOUNTER — Ambulatory Visit: Admitting: Cardiology

## 2023-09-24 ENCOUNTER — Other Ambulatory Visit: Payer: Self-pay | Admitting: *Deleted

## 2023-09-24 ENCOUNTER — Ambulatory Visit: Payer: Self-pay | Admitting: Hematology & Oncology

## 2023-09-24 ENCOUNTER — Inpatient Hospital Stay

## 2023-09-24 DIAGNOSIS — I824Z1 Acute embolism and thrombosis of unspecified deep veins of right distal lower extremity: Secondary | ICD-10-CM

## 2023-09-24 DIAGNOSIS — I82551 Chronic embolism and thrombosis of right peroneal vein: Secondary | ICD-10-CM | POA: Diagnosis not present

## 2023-09-24 DIAGNOSIS — Z86711 Personal history of pulmonary embolism: Secondary | ICD-10-CM

## 2023-09-24 DIAGNOSIS — Z86718 Personal history of other venous thrombosis and embolism: Secondary | ICD-10-CM

## 2023-09-24 DIAGNOSIS — Z823 Family history of stroke: Secondary | ICD-10-CM

## 2023-09-24 DIAGNOSIS — Z7901 Long term (current) use of anticoagulants: Secondary | ICD-10-CM

## 2023-09-24 LAB — PROTIME-INR
INR: 2.7 — ABNORMAL HIGH (ref 0.8–1.2)
Prothrombin Time: 29.6 s — ABNORMAL HIGH (ref 11.4–15.2)

## 2023-09-27 ENCOUNTER — Encounter: Payer: Self-pay | Admitting: Cardiology

## 2023-09-27 ENCOUNTER — Ambulatory Visit: Attending: Cardiology | Admitting: Cardiology

## 2023-09-27 VITALS — BP 111/63 | HR 62 | Resp 16 | Ht 73.0 in | Wt 229.2 lb

## 2023-09-27 DIAGNOSIS — E782 Mixed hyperlipidemia: Secondary | ICD-10-CM | POA: Insufficient documentation

## 2023-09-27 DIAGNOSIS — I2510 Atherosclerotic heart disease of native coronary artery without angina pectoris: Secondary | ICD-10-CM | POA: Diagnosis present

## 2023-09-27 MED ORDER — ATORVASTATIN CALCIUM 80 MG PO TABS: 80.0000 mg | ORAL_TABLET | Freq: Every day | ORAL | 2 refills | Status: AC

## 2023-09-27 NOTE — Progress Notes (Signed)
  Cardiology Office Note:  .   Date:  09/27/2023  ID:  Glenn Wheeler, DOB March 20, 1955, MRN [id number] PCP: Joannie Rather, MD  [facility] Providers Cardiologist:  Newman Lawrence, MD PCP: Joannie Rather, MD  Chief Complaint  Patient presents with   Follow-up    1 year   Coronary artery disease of native artery of native heart wi     Glenn Wheeler is a 68 y.o. male with hypertension, hyperlipidemia, CAD, h/o Leishmaniasis in 2013, h/o recurrent thromboembolism, former smoker   History of Present Illness  Patient is doing well, denies any chest pain shortness of breath symptoms.  He had had episodes of high blood pressure a couple months ago, but blood pressure has settled down nicely now.    Vitals:   09/27/23 0922  BP: 111/63  Pulse: 62  Resp: 16  SpO2: 94%      Review of Systems  Cardiovascular:  Positive for leg swelling (Mild, chronic, stable. H/o DVT). Negative for chest pain, dyspnea on exertion, palpitations and syncope.        Studies Reviewed: SABRA        EKG 09/27/2023: Sinus rhythm 58 bpm Left axis deviation Moderate voltage criteria for LVH, may be normal variant ( R in aVL , Cornell product ) When compared with ECG of 23-Jul-2018 12:59, QRS axis Shifted right    Labs 07/2023: Cr 1.3, K 4.0    Physical Exam Vitals and nursing note reviewed.  Constitutional:      General: He is not in acute distress. Neck:     Vascular: No JVD.  Cardiovascular:     Rate and Rhythm: Normal rate and regular rhythm.     Heart sounds: Normal heart sounds. No murmur heard. Pulmonary:     Effort: Pulmonary effort is normal.     Breath sounds: Normal breath sounds. No wheezing or rales.  Musculoskeletal:     Right lower leg: Edema (Trace) present.     Left lower leg: No edema.      VISIT DIAGNOSES:   ICD-10-CM   1. Coronary artery disease involving native coronary artery of native heart without angina pectoris  I25.10 EKG 12-Lead     atorvastatin  (LIPITOR) 80 MG tablet    2. Mixed hyperlipidemia  E78.2        Glenn Wheeler is a 68 y.o. male with hypertension, hyperlipidemia, CAD, h/o Leishmaniasis in 2013, h/o recurrent thromboembolism, former smoker Assessment & Plan  CAD: Mild disease noted on cath in 2020.  Exercise nuclear stress test (27975) without significant iscemia. Not on aspirin  due to ongoing use of warfarin for recurrent VTE LDL 87 on Lipitor 40 mg daily.  Recommend increasing to 80 mg daily.  This can be refilled through PCP in future.  Recurrent thromboembolism, thrombocytopenia: Continue warfarin. Continue f/u w/Dr. Timmy.   Meds ordered this encounter  Medications   atorvastatin  (LIPITOR) 80 MG tablet    Sig: Take 1 tablet (80 mg total) by mouth at bedtime.    Dispense:  90 tablet    Refill:  2     F/u as needed  Signed, Newman JINNY Lawrence, MD

## 2023-09-27 NOTE — Patient Instructions (Signed)
 Medication Instructions:  STOP nitroglycerin    INCREASE Atorvastatin  to 80 mg   *If you need a refill on your cardiac medications before your next appointment, please call your pharmacy*   Follow-Up: At [facility], you and your health needs are our priority.  As part of our continuing mission to provide you with exceptional heart care, our providers are all part of one team.  This team includes your primary Cardiologist (physician) and Advanced Practice Providers or APPs (Physician Assistants and Nurse Practitioners) who all work together to provide you with the care you need, when you need it.  Your next appointment:   As needed   Provider:   Newman JINNY Lawrence, MD

## 2023-09-28 ENCOUNTER — Telehealth: Payer: Self-pay | Admitting: Cardiology

## 2023-09-28 NOTE — Telephone Encounter (Signed)
 Spoke with pt who is asking if he should increase the Atorvastatin  or take zetia.  Advised pt he should take Atorvastatin  as ordered yesterday by Dr Elmira.  Advised typically Zetia would be added if the increased dose of Atorvastatin  does not bring his LDL down to goal.  Pt is to follow up with his PCP.  Will forward to MD for review if any further orders.  Pt appreciative of the call back and information.

## 2023-09-28 NOTE — Telephone Encounter (Signed)
 Pt called in and stated that he would like the option of Dr Bruna to see if the Zetia or the liptor would be better for him?  Per pt ins will be ok either way.  He would like a call back from the nurse   Best number [phone]

## 2023-09-28 NOTE — Telephone Encounter (Signed)
 Spoke to patient and advised Dr. Gilda message. Pt is going to try taking lipitor 80 mg daily.

## 2023-09-28 NOTE — Telephone Encounter (Signed)
 LDL was 87 on Lipitor 40 mg daily, wanted to get it below 70.  Therefore, I recommended increasing Lipitor to 80 mg daily.  I do not think Zetia by itself would achieve that, but 1 option could be continuing Lipitor at 40 mg daily, and adding Zetia 10 mg daily.  I am okay with that if patient would prefer.  Thanks MJP

## 2023-10-08 ENCOUNTER — Inpatient Hospital Stay: Attending: Hematology & Oncology

## 2023-10-08 ENCOUNTER — Ambulatory Visit: Payer: Self-pay | Admitting: Hematology & Oncology

## 2023-10-08 DIAGNOSIS — Z7901 Long term (current) use of anticoagulants: Secondary | ICD-10-CM | POA: Insufficient documentation

## 2023-10-08 DIAGNOSIS — I82511 Chronic embolism and thrombosis of right femoral vein: Secondary | ICD-10-CM | POA: Insufficient documentation

## 2023-10-08 DIAGNOSIS — Z86711 Personal history of pulmonary embolism: Secondary | ICD-10-CM

## 2023-10-08 DIAGNOSIS — I824Z1 Acute embolism and thrombosis of unspecified deep veins of right distal lower extremity: Secondary | ICD-10-CM

## 2023-10-08 DIAGNOSIS — R609 Edema, unspecified: Secondary | ICD-10-CM | POA: Insufficient documentation

## 2023-10-08 DIAGNOSIS — I82531 Chronic embolism and thrombosis of right popliteal vein: Secondary | ICD-10-CM | POA: Insufficient documentation

## 2023-10-08 DIAGNOSIS — I82551 Chronic embolism and thrombosis of right peroneal vein: Secondary | ICD-10-CM | POA: Insufficient documentation

## 2023-10-08 DIAGNOSIS — Z823 Family history of stroke: Secondary | ICD-10-CM

## 2023-10-08 DIAGNOSIS — Z86718 Personal history of other venous thrombosis and embolism: Secondary | ICD-10-CM

## 2023-10-08 LAB — PROTIME-INR
INR: 3 — ABNORMAL HIGH (ref 0.8–1.2)
Prothrombin Time: 32.6 s — ABNORMAL HIGH (ref 11.4–15.2)

## 2023-10-12 ENCOUNTER — Other Ambulatory Visit: Payer: Self-pay | Admitting: Family

## 2023-10-12 DIAGNOSIS — Z86718 Personal history of other venous thrombosis and embolism: Secondary | ICD-10-CM

## 2023-10-12 DIAGNOSIS — Z86711 Personal history of pulmonary embolism: Secondary | ICD-10-CM

## 2023-10-15 ENCOUNTER — Inpatient Hospital Stay

## 2023-10-15 ENCOUNTER — Inpatient Hospital Stay (HOSPITAL_BASED_OUTPATIENT_CLINIC_OR_DEPARTMENT_OTHER): Admitting: Family

## 2023-10-15 VITALS — BP 133/77 | HR 62 | Temp 98.2°F | Resp 17 | Ht 73.0 in | Wt 229.0 lb

## 2023-10-15 DIAGNOSIS — Z86711 Personal history of pulmonary embolism: Secondary | ICD-10-CM

## 2023-10-15 DIAGNOSIS — I82409 Acute embolism and thrombosis of unspecified deep veins of unspecified lower extremity: Secondary | ICD-10-CM | POA: Diagnosis not present

## 2023-10-15 DIAGNOSIS — Z86718 Personal history of other venous thrombosis and embolism: Secondary | ICD-10-CM

## 2023-10-15 DIAGNOSIS — I82551 Chronic embolism and thrombosis of right peroneal vein: Secondary | ICD-10-CM | POA: Diagnosis not present

## 2023-10-15 DIAGNOSIS — Z7901 Long term (current) use of anticoagulants: Secondary | ICD-10-CM

## 2023-10-15 LAB — CMP (CANCER CENTER ONLY)
ALT: 40 U/L (ref 0–44)
AST: 37 U/L (ref 15–41)
Albumin: 4.2 g/dL (ref 3.5–5.0)
Alkaline Phosphatase: 89 U/L (ref 38–126)
Anion gap: 10 (ref 5–15)
BUN: 23 mg/dL (ref 8–23)
CO2: 25 mmol/L (ref 22–32)
Calcium: 8.8 mg/dL — ABNORMAL LOW (ref 8.9–10.3)
Chloride: 107 mmol/L (ref 98–111)
Creatinine: 1.22 mg/dL (ref 0.61–1.24)
GFR, Estimated: >60 mL/min (ref >60)
Glucose, Bld: 109 mg/dL — ABNORMAL HIGH (ref 70–99)
Potassium: 4.5 mmol/L (ref 3.5–5.1)
Sodium: 142 mmol/L (ref 135–145)
Total Bilirubin: 0.7 mg/dL (ref 0.0–1.2)
Total Protein: 6.7 g/dL (ref 6.5–8.1)

## 2023-10-15 LAB — CBC WITH DIFFERENTIAL (CANCER CENTER ONLY)
Abs Immature Granulocytes: 0 K/uL (ref 0.00–0.07)
Basophils Absolute: 0 K/uL (ref 0.0–0.1)
Basophils Relative: 1 %
Eosinophils Absolute: 0.1 K/uL (ref 0.0–0.5)
Eosinophils Relative: 1 %
HCT: 43.3 % (ref 39.0–52.0)
Hemoglobin: 14.8 g/dL (ref 13.0–17.0)
Immature Granulocytes: 0 %
Lymphocytes Relative: 29 %
Lymphs Abs: 1.4 K/uL (ref 0.7–4.0)
MCH: 29.8 pg (ref 26.0–34.0)
MCHC: 34.2 g/dL (ref 30.0–36.0)
MCV: 87.3 fL (ref 80.0–100.0)
Monocytes Absolute: 0.5 K/uL (ref 0.1–1.0)
Monocytes Relative: 10 %
Neutro Abs: 2.9 K/uL (ref 1.7–7.7)
Neutrophils Relative %: 59 %
Platelet Count: 122 K/uL — ABNORMAL LOW (ref 150–400)
RBC: 4.96 MIL/uL (ref 4.22–5.81)
RDW: 14 % (ref 11.5–15.5)
WBC Count: 4.8 K/uL (ref 4.0–10.5)
nRBC: 0 % (ref 0.0–0.2)

## 2023-10-15 LAB — PROTIME-INR
INR: 3.2 — ABNORMAL HIGH (ref 0.8–1.2)
Prothrombin Time: 34.6 s — ABNORMAL HIGH (ref 11.4–15.2)

## 2023-10-15 NOTE — Progress Notes (Signed)
 Hematology and Oncology Follow Up Visit  [name] [id number] 1956/01/15 68 y.o. 10/15/2023   Principle Diagnosis:  Occlusive thromboembolism of right peroneal vein -- recurrent Chronic DVT within the right femoral and popliteal veins   Current Therapy:        Pradaxa  150 mg p.o. twice daily -- start on 01/20/2022 -- d/c on 03/28/2022 Coumadin  10 mg po q day -- start on 04/07/2022 -- INR 2.5-3.5   Interim History:  [name] is here today for follow-up. He is doing quite well and has no complaints at this time.  He is wearing compression stockings for added support. Minimal fluid retention in the right leg. We discussed measuring calf size with the 20-30 mmHG pressure  No falls or syncope reported.  He is tolerating coumadin  nicely and taking 10 mg PO daily 2 days on and the 7.5 mg the third day.  No blood loss, bruising or petechiae noted.   No fever, chills, n/v, cough, rash, dizziness, SOB, chest pain, palpitations, abdominal pain or changes in bowel or bladder habits.  Appetite and hydration are good. Weight is stable at 229 lbs.   ECOG Performance Status: 1 - Symptomatic but completely ambulatory  Medications:  Allergies as of 10/15/2023   No Known Allergies      Medication List        Accurate as of October 15, 2023 10:46 AM. If you have any questions, ask your nurse or doctor.          atorvastatin  80 MG tablet Commonly known as: LIPITOR Take 1 tablet (80 mg total) by mouth at bedtime.   ibuprofen 800 MG tablet Commonly known as: ADVIL 800 mg.   MELATONIN PO Take by mouth at bedtime as needed.   metoprolol  succinate 25 MG 24 hr tablet Commonly known as: TOPROL -XL Take 1 tablet (25 mg total) by mouth daily.   multivitamin with minerals tablet Take 1 tablet by mouth at bedtime.   sildenafil 20 MG tablet Commonly known as: REVATIO Take 20 mg by mouth as needed (erectile disfunction).   tamsulosin 0.4 MG Caps capsule Commonly known as:  FLOMAX Take 0.4 mg by mouth daily.   TYLENOL  PO Take 1 tablet by mouth as needed (pain).   Vitamin D3 1.25 MG (50000 UT) Caps Take 1.25 mg by mouth daily.   warfarin 7.5 MG tablet Commonly known as: COUMADIN  Take as directed by the anticoagulation clinic. If you are unsure how to take this medication, talk to your nurse or doctor. Original instructions: TAKE 1 TABLET BY MOUTH EVERY DAY What changed:  when to take this additional instructions   warfarin 10 MG tablet Commonly known as: COUMADIN  Take as directed by the anticoagulation clinic. If you are unsure how to take this medication, talk to your nurse or doctor. Original instructions: TAKE 1 TABLET BY MOUTH EVERY DAY What changed: additional instructions        Allergies: No Known Allergies  Past Medical History, Surgical history, Social history, and Family History were reviewed and updated.  Review of Systems: All other 10 point review of systems is negative.   Physical Exam:  vitals were not taken for this visit.   Wt Readings from Last 3 Encounters:  09/27/23 229 lb 3.2 oz (104 kg)  08/21/23 235 lb (106.6 kg)  06/27/23 235 lb (106.6 kg)    Ocular: Sclerae unicteric, pupils equal, round and reactive to light Ear-nose-throat: Oropharynx clear, dentition fair Lymphatic: No cervical or supraclavicular adenopathy Lungs no rales  or rhonchi, good excursion bilaterally Heart regular rate and rhythm, no murmur appreciated Abd soft, nontender, positive bowel sounds MSK no focal spinal tenderness, no joint edema Neuro: non-focal, well-oriented, appropriate affect Breasts: Deferred   Lab Results  Component Value Date   WBC 4.8 10/15/2023   HGB 14.8 10/15/2023   HCT 43.3 10/15/2023   MCV 87.3 10/15/2023   PLT PENDING 10/15/2023   No results found for: FERRITIN, IRON, TIBC, UIBC, IRONPCTSAT Lab Results  Component Value Date   RBC 4.96 10/15/2023   No results found for: KPAFRELGTCHN, LAMBDASER,  KAPLAMBRATIO No results found for: IGGSERUM, IGA, IGMSERUM No results found for: STEPHANY CARLOTA BENSON MARKEL EARLA JOANNIE DOC VICK, SPEI   Chemistry      Component Value Date/Time   NA 141 07/30/2023 0915   NA 143 06/05/2013 0919   K 4.0 07/30/2023 0915   CL 106 07/30/2023 0915   CO2 26 07/30/2023 0915   BUN 28 (H) 07/30/2023 0915   BUN 29 (H) 06/05/2013 0919   CREATININE 1.31 (H) 07/30/2023 0915   CREATININE 1.92 (H) 04/28/2013 1027      Component Value Date/Time   CALCIUM  9.0 07/30/2023 0915   ALKPHOS 90 07/30/2023 0915   AST 25 07/30/2023 0915   ALT 29 07/30/2023 0915   BILITOT 0.8 07/30/2023 0915       Impression and Plan: Glenn Wheeler is a pleasant 68 yo gentleman with history of recurrent DVT and chronic DVT in the right lower extremity.  He recurred while on Pradaxa  and is now tolerating Coumadin  nicely.  So far there has been no new recurrence.  INR is pending. No change in Coumadin  dose 10 mg PO daily at this time.  INR check every month, follow-up in 4 months.   Lauraine Pepper, NP 9/15/202510:46 AM

## 2023-10-19 ENCOUNTER — Other Ambulatory Visit

## 2023-10-19 ENCOUNTER — Ambulatory Visit: Admitting: Family

## 2023-11-27 ENCOUNTER — Ambulatory Visit: Payer: Self-pay

## 2023-11-27 ENCOUNTER — Inpatient Hospital Stay: Attending: Hematology & Oncology

## 2023-11-27 ENCOUNTER — Encounter: Payer: Self-pay | Admitting: *Deleted

## 2023-11-27 ENCOUNTER — Other Ambulatory Visit: Payer: Self-pay | Admitting: *Deleted

## 2023-11-27 DIAGNOSIS — Z7901 Long term (current) use of anticoagulants: Secondary | ICD-10-CM

## 2023-11-27 DIAGNOSIS — Z86711 Personal history of pulmonary embolism: Secondary | ICD-10-CM

## 2023-11-27 DIAGNOSIS — I82551 Chronic embolism and thrombosis of right peroneal vein: Secondary | ICD-10-CM | POA: Insufficient documentation

## 2023-11-27 DIAGNOSIS — I82409 Acute embolism and thrombosis of unspecified deep veins of unspecified lower extremity: Secondary | ICD-10-CM

## 2023-11-27 DIAGNOSIS — Z86718 Personal history of other venous thrombosis and embolism: Secondary | ICD-10-CM

## 2023-11-27 LAB — PROTIME-INR
INR: 3.5 — ABNORMAL HIGH (ref 0.8–1.2)
Prothrombin Time: 36.4 s — ABNORMAL HIGH (ref 11.4–15.2)

## 2023-11-27 MED ORDER — WARFARIN SODIUM 10 MG PO TABS: 10.0000 mg | ORAL_TABLET | Freq: Every day | ORAL | 1 refills | Status: AC

## 2023-12-26 ENCOUNTER — Encounter: Payer: Self-pay | Admitting: Family

## 2023-12-26 ENCOUNTER — Inpatient Hospital Stay: Attending: Hematology & Oncology

## 2023-12-26 DIAGNOSIS — I82511 Chronic embolism and thrombosis of right femoral vein: Secondary | ICD-10-CM | POA: Insufficient documentation

## 2023-12-26 DIAGNOSIS — Z7901 Long term (current) use of anticoagulants: Secondary | ICD-10-CM | POA: Diagnosis not present

## 2023-12-26 DIAGNOSIS — I82409 Acute embolism and thrombosis of unspecified deep veins of unspecified lower extremity: Secondary | ICD-10-CM

## 2023-12-26 DIAGNOSIS — I82531 Chronic embolism and thrombosis of right popliteal vein: Secondary | ICD-10-CM | POA: Insufficient documentation

## 2023-12-26 DIAGNOSIS — Z86718 Personal history of other venous thrombosis and embolism: Secondary | ICD-10-CM

## 2023-12-26 DIAGNOSIS — Z86711 Personal history of pulmonary embolism: Secondary | ICD-10-CM

## 2023-12-26 LAB — PROTIME-INR
INR: 2.4 — ABNORMAL HIGH (ref 0.8–1.2)
Prothrombin Time: 27.2 s — ABNORMAL HIGH (ref 11.4–15.2)

## 2024-01-05 ENCOUNTER — Other Ambulatory Visit: Payer: Self-pay | Admitting: Hematology & Oncology

## 2024-01-23 ENCOUNTER — Inpatient Hospital Stay: Attending: Hematology & Oncology

## 2024-01-23 ENCOUNTER — Encounter: Payer: Self-pay | Admitting: Family

## 2024-01-23 DIAGNOSIS — Z86718 Personal history of other venous thrombosis and embolism: Secondary | ICD-10-CM | POA: Diagnosis present

## 2024-01-23 DIAGNOSIS — I82409 Acute embolism and thrombosis of unspecified deep veins of unspecified lower extremity: Secondary | ICD-10-CM

## 2024-01-23 DIAGNOSIS — Z7901 Long term (current) use of anticoagulants: Secondary | ICD-10-CM

## 2024-01-23 DIAGNOSIS — Z86711 Personal history of pulmonary embolism: Secondary | ICD-10-CM

## 2024-01-23 LAB — PROTIME-INR
INR: 2.3 — ABNORMAL HIGH (ref 0.8–1.2)
Prothrombin Time: 26.6 s — ABNORMAL HIGH (ref 11.4–15.2)

## 2024-02-25 ENCOUNTER — Inpatient Hospital Stay: Admitting: Family

## 2024-02-25 ENCOUNTER — Inpatient Hospital Stay

## 2024-02-29 ENCOUNTER — Other Ambulatory Visit: Payer: Self-pay

## 2024-02-29 DIAGNOSIS — Z86718 Personal history of other venous thrombosis and embolism: Secondary | ICD-10-CM

## 2024-03-03 ENCOUNTER — Inpatient Hospital Stay: Admitting: Family

## 2024-03-03 ENCOUNTER — Inpatient Hospital Stay

## 2024-03-03 DIAGNOSIS — Z86718 Personal history of other venous thrombosis and embolism: Secondary | ICD-10-CM

## 2024-03-04 ENCOUNTER — Inpatient Hospital Stay: Admitting: Family

## 2024-03-04 ENCOUNTER — Inpatient Hospital Stay

## 2024-03-05 ENCOUNTER — Inpatient Hospital Stay: Admitting: Family

## 2024-03-05 ENCOUNTER — Ambulatory Visit: Payer: Self-pay | Admitting: Hematology & Oncology

## 2024-03-05 ENCOUNTER — Inpatient Hospital Stay: Attending: Hematology & Oncology

## 2024-03-05 ENCOUNTER — Encounter: Payer: Self-pay | Admitting: Family

## 2024-03-05 VITALS — BP 138/80 | HR 65 | Temp 97.7°F | Resp 18 | Wt 239.1 lb

## 2024-03-05 DIAGNOSIS — Z7901 Long term (current) use of anticoagulants: Secondary | ICD-10-CM | POA: Diagnosis not present

## 2024-03-05 DIAGNOSIS — Z86718 Personal history of other venous thrombosis and embolism: Secondary | ICD-10-CM

## 2024-03-05 DIAGNOSIS — I82409 Acute embolism and thrombosis of unspecified deep veins of unspecified lower extremity: Secondary | ICD-10-CM | POA: Diagnosis not present

## 2024-03-05 DIAGNOSIS — Z86711 Personal history of pulmonary embolism: Secondary | ICD-10-CM | POA: Diagnosis not present

## 2024-03-05 LAB — CBC WITH DIFFERENTIAL (CANCER CENTER ONLY)
Abs Immature Granulocytes: 0.01 10*3/uL (ref 0.00–0.07)
Basophils Absolute: 0 10*3/uL (ref 0.0–0.1)
Basophils Relative: 1 %
Eosinophils Absolute: 0.1 10*3/uL (ref 0.0–0.5)
Eosinophils Relative: 1 %
HCT: 45.1 % (ref 39.0–52.0)
Hemoglobin: 15.2 g/dL (ref 13.0–17.0)
Immature Granulocytes: 0 %
Lymphocytes Relative: 28 %
Lymphs Abs: 1.5 10*3/uL (ref 0.7–4.0)
MCH: 29.6 pg (ref 26.0–34.0)
MCHC: 33.7 g/dL (ref 30.0–36.0)
MCV: 87.7 fL (ref 80.0–100.0)
Monocytes Absolute: 0.4 10*3/uL (ref 0.1–1.0)
Monocytes Relative: 8 %
Neutro Abs: 3.2 10*3/uL (ref 1.7–7.7)
Neutrophils Relative %: 62 %
Platelet Count: 112 10*3/uL — ABNORMAL LOW (ref 150–400)
RBC: 5.14 MIL/uL (ref 4.22–5.81)
RDW: 14 % (ref 11.5–15.5)
WBC Count: 5.2 10*3/uL (ref 4.0–10.5)
nRBC: 0 % (ref 0.0–0.2)

## 2024-03-05 LAB — PROTIME-INR
INR: 2.8 — ABNORMAL HIGH (ref 0.8–1.2)
Prothrombin Time: 31 s — ABNORMAL HIGH (ref 11.4–15.2)

## 2024-03-05 LAB — CMP (CANCER CENTER ONLY)
ALT: 42 U/L (ref 0–44)
AST: 43 U/L — ABNORMAL HIGH (ref 15–41)
Albumin: 4.3 g/dL (ref 3.5–5.0)
Alkaline Phosphatase: 86 U/L (ref 38–126)
Anion gap: 11 (ref 5–15)
BUN: 23 mg/dL (ref 8–23)
CO2: 26 mmol/L (ref 22–32)
Calcium: 9 mg/dL (ref 8.9–10.3)
Chloride: 105 mmol/L (ref 98–111)
Creatinine: 1.26 mg/dL — ABNORMAL HIGH (ref 0.61–1.24)
GFR, Estimated: >60 mL/min
Glucose, Bld: 117 mg/dL — ABNORMAL HIGH (ref 70–99)
Potassium: 4.7 mmol/L (ref 3.5–5.1)
Sodium: 141 mmol/L (ref 135–145)
Total Bilirubin: 0.7 mg/dL (ref 0.0–1.2)
Total Protein: 6.8 g/dL (ref 6.5–8.1)

## 2024-03-05 NOTE — Progress Notes (Signed)
 " Hematology and Oncology Follow Up Visit  Glenn Wheeler [id number] 05/30/1955 69 y.o. 03/05/2024   Principle Diagnosis:  Occlusive thromboembolism of right peroneal vein -- recurrent Chronic DVT within the right femoral and popliteal veins   Current Therapy:        Pradaxa  150 mg p.o. twice daily -- start on 01/20/2022 -- d/c on 03/28/2022 Coumadin  10 mg po q day -- start on 04/07/2022 -- INR 2.5-3.5   Interim History:  Glenn Wheeler is here today for follow-up. He is doing well and has no complaints at this time.  INR is pending.  No fever, chills, n/v, cough, rash, dizziness, SOB, chest pain, palpitations, abdominal pain or changes in bowel or bladder habits.  Mild swelling in the right lower extremity unchanged from baseline.  No falls or syncope.  Appetite and hydration are good. Weight is stable at 239 lbs.   ECOG Performance Status: 1 - Symptomatic but completely ambulatory  Medications:  Allergies as of 03/05/2024   No Known Allergies      Medication List        Accurate as of March 05, 2024  3:16 PM. If you have any questions, ask your nurse or doctor.          STOP taking these medications    ibuprofen 800 MG tablet Commonly known as: ADVIL Stopped by: Lauraine Pepper, NP       TAKE these medications    atorvastatin  80 MG tablet Commonly known as: LIPITOR Take 1 tablet (80 mg total) by mouth at bedtime.   MELATONIN PO Take by mouth at bedtime as needed.   metoprolol  succinate 25 MG 24 hr tablet Commonly known as: TOPROL -XL Take 1 tablet (25 mg total) by mouth daily.   multivitamin with minerals tablet Take 1 tablet by mouth at bedtime.   sildenafil 20 MG tablet Commonly known as: REVATIO Take 20 mg by mouth as needed (erectile disfunction).   tamsulosin 0.4 MG Caps capsule Commonly known as: FLOMAX Take 0.4 mg by mouth daily.   TYLENOL  PO Take 1 tablet by mouth as needed (pain).   Vitamin D3 1.25 MG (50000 UT) Caps Take 1.25 mg by mouth  daily.   warfarin 10 MG tablet Commonly known as: COUMADIN  Take as directed by the anticoagulation clinic. If you are unsure how to take this medication, talk to your nurse or doctor. Original instructions: Take 1 tablet (10 mg total) by mouth daily. What changed: additional instructions   warfarin 7.5 MG tablet Commonly known as: COUMADIN  Take as directed by the anticoagulation clinic. If you are unsure how to take this medication, talk to your nurse or doctor. Original instructions: TAKE 1 TABLET BY MOUTH EVERY DAY What changed: additional instructions        Allergies: Allergies[1]  Past Medical History, Surgical history, Social history, and Family History were reviewed and updated.  Review of Systems: All other 10 point review of systems is negative.   Physical Exam:  weight is 239 lb 1.9 oz (108.5 kg). His oral temperature is 97.7 F (36.5 C). His blood pressure is 138/80 and his pulse is 65. His respiration is 18 and oxygen saturation is 97%.   Wt Readings from Last 3 Encounters:  03/05/24 239 lb 1.9 oz (108.5 kg)  10/15/23 229 lb (103.9 kg)  09/27/23 229 lb 3.2 oz (104 kg)    Ocular: Sclerae unicteric, pupils equal, round and reactive to light Ear-nose-throat: Oropharynx clear, dentition fair Lymphatic: No cervical or supraclavicular adenopathy  Lungs no rales or rhonchi, good excursion bilaterally Heart regular rate and rhythm, no murmur appreciated Abd soft, nontender, positive bowel sounds MSK no focal spinal tenderness, no joint edema Neuro: non-focal, well-oriented, appropriate affect Breasts: Deferred   Lab Results  Component Value Date   WBC 5.2 03/05/2024   HGB 15.2 03/05/2024   HCT 45.1 03/05/2024   MCV 87.7 03/05/2024   PLT 112 (L) 03/05/2024   No results found for: FERRITIN, IRON, TIBC, UIBC, IRONPCTSAT Lab Results  Component Value Date   RBC 5.14 03/05/2024   No results found for: KPAFRELGTCHN, LAMBDASER, KAPLAMBRATIO No  results found for: IGGSERUM, IGA, IGMSERUM No results found for: STEPHANY CARLOTA BENSON MARKEL EARLA JOANNIE DOC VICK, SPEI   Chemistry      Component Value Date/Time   NA 142 10/15/2023 1036   NA 143 06/05/2013 0919   K 4.5 10/15/2023 1036   CL 107 10/15/2023 1036   CO2 25 10/15/2023 1036   BUN 23 10/15/2023 1036   BUN 29 (H) 06/05/2013 0919   CREATININE 1.22 10/15/2023 1036   CREATININE 1.92 (H) 04/28/2013 1027      Component Value Date/Time   CALCIUM  8.8 (L) 10/15/2023 1036   ALKPHOS 89 10/15/2023 1036   AST 37 10/15/2023 1036   ALT 40 10/15/2023 1036   BILITOT 0.7 10/15/2023 1036       Impression and Plan: Glenn Wheeler is a pleasant 69 yo gentleman with history of recurrent DVT and chronic DVT in the right lower extremity.  He recurred while on Pradaxa  and is now tolerating Coumadin  nicely.  So far there has been no new recurrence.  INR is pending. No change in Coumadin  dose 10 mg PO daily x 2 days then 7.5 mg PO x 1 day then repeat.  INR check every month, follow-up in 4 months.   Lauraine Pepper, NP 2/4/20263:16 PM     [1] No Known Allergies  "

## 2024-03-31 ENCOUNTER — Inpatient Hospital Stay: Attending: Hematology & Oncology

## 2024-04-28 ENCOUNTER — Inpatient Hospital Stay

## 2024-05-26 ENCOUNTER — Inpatient Hospital Stay: Attending: Hematology & Oncology

## 2024-06-24 ENCOUNTER — Inpatient Hospital Stay: Admitting: Family

## 2024-06-24 ENCOUNTER — Inpatient Hospital Stay: Attending: Hematology & Oncology
